# Patient Record
Sex: Male | Born: 1960
Health system: Southern US, Community
[De-identification: ages and names within clinical notes are randomized; demographics above are authoritative.]

## PROBLEM LIST (undated history)

## (undated) DIAGNOSIS — C801 Malignant (primary) neoplasm, unspecified: Secondary | ICD-10-CM

## (undated) DIAGNOSIS — R441 Visual hallucinations: Secondary | ICD-10-CM

## (undated) DIAGNOSIS — M5020 Other cervical disc displacement, unspecified cervical region: Secondary | ICD-10-CM

## (undated) DIAGNOSIS — E785 Hyperlipidemia, unspecified: Secondary | ICD-10-CM

## (undated) DIAGNOSIS — G4752 REM sleep behavior disorder: Secondary | ICD-10-CM

## (undated) DIAGNOSIS — I10 Essential (primary) hypertension: Secondary | ICD-10-CM

## (undated) HISTORY — DX: Hyperlipidemia, unspecified: E78.5

## (undated) HISTORY — DX: Malignant (primary) neoplasm, unspecified: C80.1

## (undated) HISTORY — PX: EYE SURGERY: SHX253

## (undated) HISTORY — PX: HERNIA REPAIR: SHX51

## (undated) HISTORY — DX: Other cervical disc displacement, unspecified cervical region: M50.20

## (undated) HISTORY — PX: FINGER FRACTURE SURGERY: SHX638

## (undated) HISTORY — DX: Essential (primary) hypertension: I10

## (undated) HISTORY — DX: REM sleep behavior disorder: G47.52

## (undated) HISTORY — DX: Visual hallucinations: R44.1

## (undated) HISTORY — PX: BASAL CELL CARCINOMA EXCISION: SHX1214

---

## 2005-01-02 ENCOUNTER — Ambulatory Visit: Payer: Self-pay | Admitting: Internal Medicine

## 2005-01-05 ENCOUNTER — Ambulatory Visit: Payer: Self-pay | Admitting: Internal Medicine

## 2006-06-07 ENCOUNTER — Ambulatory Visit: Payer: Self-pay | Admitting: Internal Medicine

## 2006-06-07 LAB — CONVERTED CEMR LAB
Chlamydia, DNA Probe: NEGATIVE
GC Probe Amp, Genital: NEGATIVE

## 2006-06-08 ENCOUNTER — Encounter: Payer: Self-pay | Admitting: Internal Medicine

## 2006-06-08 LAB — CONVERTED CEMR LAB
Alkaline Phosphatase: 26 units/L — ABNORMAL LOW (ref 39–117)
BUN: 8 mg/dL (ref 6–23)
Calcium: 9.3 mg/dL (ref 8.4–10.5)
Chol/HDL Ratio, serum: 5
Cholesterol: 228 mg/dL (ref 0–200)
Creatinine, Ser: 1 mg/dL (ref 0.4–1.5)
GFR calc non Af Amer: 85 mL/min
Sodium: 145 meq/L (ref 135–145)
Total Bilirubin: 1 mg/dL (ref 0.3–1.2)
Total Protein: 7.6 g/dL (ref 6.0–8.3)
Triglyceride fasting, serum: 232 mg/dL (ref 0–149)

## 2006-06-27 ENCOUNTER — Ambulatory Visit: Payer: Self-pay | Admitting: Internal Medicine

## 2006-07-24 ENCOUNTER — Ambulatory Visit: Payer: Self-pay | Admitting: Internal Medicine

## 2006-07-24 LAB — CONVERTED CEMR LAB
Albumin: 4.4 g/dL (ref 3.5–5.2)
Alkaline Phosphatase: 30 units/L — ABNORMAL LOW (ref 39–117)
Basophils Absolute: 0 10*3/uL (ref 0.0–0.1)
Basophils Relative: 0.9 % (ref 0.0–1.0)
Bilirubin, Direct: 0.2 mg/dL (ref 0.0–0.3)
CO2: 32 meq/L (ref 19–32)
Calcium: 9.9 mg/dL (ref 8.4–10.5)
Chloride: 101 meq/L (ref 96–112)
Cholesterol: 224 mg/dL (ref 0–200)
Eosinophils Relative: 2.7 % (ref 0.0–5.0)
GFR calc Af Amer: 116 mL/min
Glucose, Bld: 105 mg/dL — ABNORMAL HIGH (ref 70–99)
HDL: 51.2 mg/dL (ref >39.0)
Hemoglobin, Urine: NEGATIVE
MCHC: 34.8 g/dL (ref 30.0–36.0)
MCV: 94.4 fL (ref 78.0–100.0)
Monocytes Relative: 10.6 % (ref 3.0–11.0)
Neutrophils Relative %: 54.4 % (ref 43.0–77.0)
Nitrite: NEGATIVE
Platelets: 195 10*3/uL (ref 150–400)
Potassium: 3.6 meq/L (ref 3.5–5.1)
RDW: 11.5 % (ref 11.5–14.6)
Sodium: 140 meq/L (ref 135–145)
Specific Gravity, Urine: 1.01 (ref 1.000–1.03)
TSH: 2.11 microintl units/mL (ref 0.35–5.50)
Total Bilirubin: 1.2 mg/dL (ref 0.3–1.2)
Total Protein: 7.7 g/dL (ref 6.0–8.3)
VLDL: 36 mg/dL (ref 0–40)

## 2006-07-27 ENCOUNTER — Ambulatory Visit: Payer: Self-pay | Admitting: Internal Medicine

## 2006-07-30 ENCOUNTER — Ambulatory Visit: Payer: Self-pay

## 2006-08-13 ENCOUNTER — Ambulatory Visit: Payer: Self-pay | Admitting: Internal Medicine

## 2006-08-13 LAB — CONVERTED CEMR LAB
BUN: 9 mg/dL (ref 6–23)
CO2: 31 meq/L (ref 19–32)
Cholesterol: 161 mg/dL (ref 0–200)
GFR calc Af Amer: 116 mL/min
GFR calc non Af Amer: 96 mL/min
LDL Cholesterol: 80 mg/dL (ref 0–99)
Potassium: 3.8 meq/L (ref 3.5–5.1)
Triglycerides: 168 mg/dL — ABNORMAL HIGH (ref 0–149)

## 2006-08-16 ENCOUNTER — Ambulatory Visit: Payer: Self-pay | Admitting: Internal Medicine

## 2006-09-21 ENCOUNTER — Ambulatory Visit: Payer: Self-pay | Admitting: Internal Medicine

## 2006-09-25 ENCOUNTER — Ambulatory Visit: Payer: Self-pay | Admitting: Internal Medicine

## 2006-10-01 ENCOUNTER — Ambulatory Visit: Payer: Self-pay | Admitting: Internal Medicine

## 2006-10-15 ENCOUNTER — Ambulatory Visit: Payer: Self-pay | Admitting: Internal Medicine

## 2007-01-31 DIAGNOSIS — I10 Essential (primary) hypertension: Secondary | ICD-10-CM | POA: Insufficient documentation

## 2007-01-31 HISTORY — DX: Essential (primary) hypertension: I10

## 2007-09-13 ENCOUNTER — Telehealth: Payer: Self-pay | Admitting: Internal Medicine

## 2007-09-18 ENCOUNTER — Ambulatory Visit: Payer: Self-pay | Admitting: Internal Medicine

## 2007-09-18 DIAGNOSIS — E785 Hyperlipidemia, unspecified: Secondary | ICD-10-CM | POA: Insufficient documentation

## 2007-09-18 DIAGNOSIS — I2510 Atherosclerotic heart disease of native coronary artery without angina pectoris: Secondary | ICD-10-CM | POA: Insufficient documentation

## 2007-09-19 ENCOUNTER — Ambulatory Visit: Payer: Self-pay | Admitting: Cardiology

## 2007-12-02 ENCOUNTER — Telehealth: Payer: Self-pay | Admitting: Internal Medicine

## 2007-12-02 ENCOUNTER — Ambulatory Visit: Payer: Self-pay | Admitting: Internal Medicine

## 2007-12-02 DIAGNOSIS — R519 Headache, unspecified: Secondary | ICD-10-CM | POA: Insufficient documentation

## 2007-12-02 DIAGNOSIS — R51 Headache: Secondary | ICD-10-CM | POA: Insufficient documentation

## 2007-12-02 LAB — CONVERTED CEMR LAB
Creatinine, Ser: 1 mg/dL (ref 0.4–1.5)
Potassium: 3.9 meq/L (ref 3.5–5.1)

## 2007-12-03 ENCOUNTER — Telehealth: Payer: Self-pay | Admitting: Internal Medicine

## 2008-03-06 ENCOUNTER — Ambulatory Visit: Payer: Self-pay | Admitting: Internal Medicine

## 2008-03-06 LAB — CONVERTED CEMR LAB
AST: 60 units/L — ABNORMAL HIGH (ref 0–37)
Alkaline Phosphatase: 25 units/L — ABNORMAL LOW (ref 39–117)
Basophils Absolute: 0 10*3/uL (ref 0.0–0.1)
Basophils Relative: 0.7 % (ref 0.0–3.0)
Bilirubin Urine: NEGATIVE
Bilirubin, Direct: 0.1 mg/dL (ref 0.0–0.3)
Calcium: 9.5 mg/dL (ref 8.4–10.5)
Eosinophils Absolute: 0.1 10*3/uL (ref 0.0–0.7)
Eosinophils Relative: 2.7 % (ref 0.0–5.0)
HDL: 47.2 mg/dL (ref >39.0)
Hemoglobin, Urine: NEGATIVE
Hemoglobin: 15.8 g/dL (ref 13.0–17.0)
Ketones, ur: NEGATIVE mg/dL
LDL Cholesterol: 91 mg/dL (ref 0–99)
Lymphocytes Relative: 34.9 % (ref 12.0–46.0)
Monocytes Absolute: 0.5 10*3/uL (ref 0.1–1.0)
Monocytes Relative: 10.3 % (ref 3.0–12.0)
Neutrophils Relative %: 51.4 % (ref 43.0–77.0)
PSA: 0.82 ng/mL (ref 0.10–4.00)
Platelets: 182 10*3/uL (ref 150–400)
RBC: 4.6 M/uL (ref 4.22–5.81)
Sodium: 140 meq/L (ref 135–145)
Specific Gravity, Urine: <=1.005 (ref 1.000–1.03)
TSH: 1.58 microintl units/mL (ref 0.35–5.50)
Total Protein, Urine: NEGATIVE mg/dL
Triglycerides: 119 mg/dL (ref 0–149)
VLDL: 24 mg/dL (ref 0–40)
WBC: 4.6 10*3/uL (ref 4.5–10.5)
pH: 6.5 (ref 5.0–8.0)

## 2008-03-13 ENCOUNTER — Ambulatory Visit: Payer: Self-pay | Admitting: Internal Medicine

## 2009-05-10 ENCOUNTER — Telehealth: Payer: Self-pay | Admitting: Internal Medicine

## 2009-05-27 ENCOUNTER — Ambulatory Visit: Payer: Self-pay | Admitting: Internal Medicine

## 2009-05-27 LAB — CONVERTED CEMR LAB
ALT: 117 units/L — ABNORMAL HIGH (ref 0–53)
AST: 69 units/L — ABNORMAL HIGH (ref 0–37)
Bilirubin Urine: NEGATIVE
Bilirubin, Direct: 0.2 mg/dL (ref 0.0–0.3)
CO2: 29 meq/L (ref 19–32)
Calcium: 9.3 mg/dL (ref 8.4–10.5)
Chloride: 104 meq/L (ref 96–112)
Cholesterol: 163 mg/dL (ref 0–200)
Creatinine, Ser: 1 mg/dL (ref 0.4–1.5)
Glucose, Bld: 96 mg/dL (ref 70–99)
HDL: 46.1 mg/dL (ref >39.00)
Hemoglobin, Urine: NEGATIVE
Hemoglobin: 15.7 g/dL (ref 13.0–17.0)
Ketones, ur: NEGATIVE mg/dL
Lymphocytes Relative: 39.4 % (ref 12.0–46.0)
Lymphs Abs: 1.5 10*3/uL (ref 0.7–4.0)
MCHC: 33.5 g/dL (ref 30.0–36.0)
Neutrophils Relative %: 46.4 % (ref 43.0–77.0)
PSA: 0.81 ng/mL (ref 0.10–4.00)
Platelets: 156 10*3/uL (ref 150.0–400.0)
Potassium: 4.1 meq/L (ref 3.5–5.1)
Specific Gravity, Urine: 1.015 (ref 1.000–1.030)
TSH: 2.12 microintl units/mL (ref 0.35–5.50)
Total Bilirubin: 1 mg/dL (ref 0.3–1.2)
Urine Glucose: NEGATIVE mg/dL

## 2009-06-01 ENCOUNTER — Ambulatory Visit: Payer: Self-pay | Admitting: Internal Medicine

## 2009-06-01 DIAGNOSIS — M25519 Pain in unspecified shoulder: Secondary | ICD-10-CM | POA: Insufficient documentation

## 2009-07-21 ENCOUNTER — Ambulatory Visit: Payer: Self-pay | Admitting: Internal Medicine

## 2009-07-21 LAB — CONVERTED CEMR LAB
Albumin: 4.6 g/dL (ref 3.5–5.2)
Alkaline Phosphatase: 27 units/L — ABNORMAL LOW (ref 39–117)
Total Bilirubin: 0.5 mg/dL (ref 0.3–1.2)

## 2009-07-27 ENCOUNTER — Telehealth: Payer: Self-pay | Admitting: Internal Medicine

## 2009-07-28 ENCOUNTER — Ambulatory Visit: Payer: Self-pay | Admitting: Internal Medicine

## 2009-07-28 LAB — CONVERTED CEMR LAB
HCV Ab: NEGATIVE
Hep A IgM: NEGATIVE

## 2009-08-10 ENCOUNTER — Encounter: Payer: Self-pay | Admitting: Internal Medicine

## 2009-12-29 ENCOUNTER — Ambulatory Visit: Payer: Self-pay | Admitting: Internal Medicine

## 2009-12-29 ENCOUNTER — Telehealth: Payer: Self-pay | Admitting: Internal Medicine

## 2009-12-31 ENCOUNTER — Telehealth: Payer: Self-pay | Admitting: Internal Medicine

## 2009-12-31 ENCOUNTER — Ambulatory Visit: Payer: Self-pay | Admitting: Internal Medicine

## 2010-01-03 ENCOUNTER — Ambulatory Visit: Payer: Self-pay | Admitting: Internal Medicine

## 2010-01-07 ENCOUNTER — Telehealth: Payer: Self-pay | Admitting: Internal Medicine

## 2010-01-07 ENCOUNTER — Ambulatory Visit: Payer: Self-pay | Admitting: Internal Medicine

## 2010-01-10 ENCOUNTER — Ambulatory Visit: Payer: Self-pay | Admitting: Cardiovascular Disease

## 2010-01-10 ENCOUNTER — Ambulatory Visit: Payer: Self-pay | Admitting: Internal Medicine

## 2010-01-10 ENCOUNTER — Encounter: Payer: Self-pay | Admitting: Internal Medicine

## 2010-01-10 ENCOUNTER — Observation Stay (HOSPITAL_COMMUNITY): Admission: AD | Admit: 2010-01-10 | Discharge: 2010-01-11 | Payer: Self-pay | Admitting: Internal Medicine

## 2010-06-30 NOTE — Assessment & Plan Note (Signed)
Summary: bp check/cd--attention:Sarah  Medications Added DILTIAZEM HCL ER BEADS 360 MG XR24H-CAP (DILTIAZEM HCL ER BEADS) 1 by mouth once daily for blood pressure       Nurse Visit   Vital Signs:  Patient profile:   50 year old male Pulse rate:   67 / minute BP sitting:   150 / 100  (left arm)  Vitals Entered By: Lamar Sprinkles, CMA (January 07, 2010 11:52 AM) CC: Elevated BP per pharmacy BP machine Comments MD spoke w/pt and changed bp med.    Allergies: No Known Drug Allergies  Orders Added: 1)  Est. Patient Level I [55732] Prescriptions: DILTIAZEM HCL ER BEADS 360 MG XR24H-CAP (DILTIAZEM HCL ER BEADS) 1 by mouth once daily for blood pressure  #90 x 3   Entered by:   Lamar Sprinkles, CMA   Authorized by:   Jacques Navy MD   Signed by:   Lamar Sprinkles, CMA on 01/07/2010   Method used:   Electronically to        [facility] [phone]* (retail)       [address]       [place], [place]  [address]       Ph: [phone] or [phone]       Fax: [phone]   RxID:   [id number]

## 2010-06-30 NOTE — Initial Assessments (Signed)
Summary: CHEST PAIN--TIGHTNESS  SOB--STC   Vital Signs:  Patient profile:   50 year old male Height:      71 inches Weight:      218 pounds BMI:     30.51 O2 Sat:      97 % on Room air Temp:     98.0 degrees F oral Pulse rate:   68 / minute BP sitting:   162 / 102  (left arm) Cuff size:   regular  Vitals Entered By: Bill Salinas CMA (January 10, 2010 9:04 AM)  O2 Flow:  Room air  Primary Care Shaneequa Bahner:  [facility]   History of Present Illness: [name] [name] presents acutely c/o chest pain for the past two days: he describes his discomfort and difficulty and pain with deep inspiration and a heavy pressure in his chest. He does admit to mild discomfort at rest. He also reports that this weekend while playing golf he had marked decreased exercise tolerance and that walking up a hill, which he usually does without any trouble, was very difficult due to weakness but not acute chest pain. He does report that about a month ago while riding his bike he had left sided chest pain that radiated to his back that lasted 10-215 minutes and subsided. EKG in the office reveals loss of r-wave in V1 and V2 suggestive of old anterior infarct.l  Cardiac Risk: male, hypertensive, hyperlipidemic on medication, positive for family history with his father have had multiple MIs and dying at age 50 of MI.  He is now admitted to r/o MI.   Current Medications (verified): 1)  Hyzaar 100-25 Mg  Tabs (Losartan Potassium-Hctz) .... Take 1 Tablet By Mouth Once A Day 2)  Adult Aspirin Low Strength 81 Mg  Tbdp (Aspirin) .... Take 1 Tablet By Mouth Once A Day 3)  Simvastatin 40 Mg  Tabs (Simvastatin) .Marland Kitchen.. 1 By Mouth Qpm 4)  Goodys Extra Strength [id number] Mg  Pack (Aspirin-Acetaminophen-Caffeine) .... As Directed As Needed 5)  Diltiazem Hcl Er Beads 360 Mg Xr24h-Cap (Diltiazem Hcl Er Beads) .Marland Kitchen.. 1 By Mouth Once Daily For Blood Pressure  Allergies (verified): No Known Drug Allergies  Past History:  Past Medical  History: Last updated: 04-Apr-2008 Hypertension Hyperlipidemia skin cancer-basal cell face and abdomen  Past Surgical History: Last updated: 04-Apr-2008 Hernia sx-age 46 Eye sx-child Excision of basal cell carcinomas  Fx 5th digit left hand x 2  Family History: Last updated: 04/04/08 Father- deceased @51 :: DM,HTN,MI x 7 Mother - 1940: HTN, lipid Family History Hypertension Family History MI Family History CVA Neg - prostate or colon cancer  Social History: Last updated: April 04, 2008 HSG married '83- divorced 13 yrs; married '97- 2 years divorced; married '03- 3 years divorced. 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  Risk Factors: Alcohol Use: 2 (04/04/08) Caffeine Use: 0 (Apr 04, 2008) Exercise: no (04-Apr-2008)  Risk Factors: Smoking Status: never (09/18/2007) Passive Smoke Exposure: no (2008-04-04)  Review of Systems       The patient complains of chest pain.  The patient denies anorexia, fever, weight loss, weight gain, decreased hearing, hoarseness, syncope, dyspnea on exertion, peripheral edema, prolonged cough, abdominal pain, severe indigestion/heartburn, incontinence, muscle weakness, difficulty walking, depression, and abnormal bleeding.    Physical Exam  General:  WNWD atheletic appearing white male in no acute distress Head:  Normocephalic and atraumatic without obvious abnormalities. No apparent alopecia or balding. Eyes:  No corneal or conjunctival inflammation noted. EOMI. Perrla. Funduscopic exam benign, without  hemorrhages, exudates or papilledema. Vision grossly normal. Ears:  External ear exam shows no significant lesions or deformities.  Otoscopic examination reveals clear canals, tympanic membranes are intact bilaterally without bulging, retraction, inflammation or discharge. Hearing is grossly normal bilaterally. Mouth:  missing several teeth. No oral lesions Neck:  supple, full ROM, no thyromegaly,  and no carotid bruits.   Chest Wall:  No deformities, masses, tenderness or gynecomastia noted. Lungs:  Normal respiratory effort, chest expands symmetrically. Lungs are clear to auscultation, no crackles or wheezes. Heart:  Normal rate and regular rhythm. S1 and S2 normal without gallop, murmur, click, rub or other extra sounds. Abdomen:  soft, non-tender, normal bowel sounds, no distention, no masses, no guarding, and no hepatomegaly.   Prostate:  deferred Msk:  normal ROM, no joint tenderness, no joint swelling, no joint warmth, and no joint deformities.   Pulses:  2+RADIAL PULSE Extremities:  No clubbing, cyanosis, edema, or deformity noted with normal full range of motion of all joints.   Neurologic:  alert & oriented X3, cranial nerves II-XII intact, strength normal in all extremities, sensation intact to light touch, gait normal, and DTRs symmetrical and normal.   Skin:  turgor normal, color normal, no rashes, no suspicious lesions, and no ulcerations.   Cervical Nodes:  No lymphadenopathy noted Axillary Nodes:  No palpable lymphadenopathy Psych:  Oriented X3, memory intact for recent and remote, normally interactive, and good eye contact.     Impression & Recommendations:  Problem # 1:  CHEST PAIN (ICD-786.50)  Patient with typical chest pain, decreased exercise tolerance and mutliple risk factors including very strong family history. His EKG does suggest old injury. He is now for admission  Plan - telemetry admit           cardiology consult           IV heparin-pharmacy to dose           Lab - cardiac panel q 8 x 3, CBC, metabolic, D-dimer           Portable erect chest x-ray           Meds - lopressor 25mg  two times a day, continue home meds  Orders: No Charge Patient Arrived (NCPA0) (NCPA0)  Problem # 2:  HYPERLIPIDEMIA (ICD-272.4)  His updated medication list for this problem includes:    Simvastatin 40 Mg Tabs (Simvastatin) .Marland Kitchen... 1 by mouth qpm  Labs  Reviewed: SGOT: 49 (07/21/2009)   SGPT: 64 (07/21/2009)   HDL:46.10 (05/27/2009), 47.2 (03/06/2008)  LDL:87 (05/27/2009), 91 (03/06/2008)  Chol:163 (05/27/2009), 162 (03/06/2008)  Trig:149.0 (05/27/2009), 119 (03/06/2008)  Will continue home meds  Problem # 3:  HYPERTENSION (ICD-401.9)  His updated medication list for this problem includes:    Hyzaar 100-25 Mg Tabs (Losartan potassium-hctz) .Marland Kitchen... Take 1 tablet by mouth once a day    Diltiazem Hcl Er Beads 360 Mg Xr24h-cap (Diltiazem hcl er beads) .Marland Kitchen... 1 by mouth once daily for blood pressure  BP today: 162/102 Prior BP: 138/92 (01/03/2010)  Poor control - he had done better on increased dose of diltiazem.  Will continue but add beta-blocker.   Complete Medication List: 1)  Hyzaar 100-25 Mg Tabs (Losartan potassium-hctz) .... Take 1 tablet by mouth once a day 2)  Adult Aspirin Low Strength 81 Mg Tbdp (Aspirin) .... Take 1 tablet by mouth once a day 3)  Simvastatin 40 Mg Tabs (Simvastatin) .Marland Kitchen.. 1 by mouth qpm 4)  Goodys Extra Strength [id number] Mg Pack (Aspirin-acetaminophen-caffeine) .... As  directed as needed 5)  Diltiazem Hcl Er Beads 360 Mg Xr24h-cap (Diltiazem hcl er beads) .Marland Kitchen.. 1 by mouth once daily for blood pressure  Appended Document: CHEST PAIN--TIGHTNESS  SOB--STC Patient: [name] [name] Note: All result statuses are Final unless otherwise noted.  Tests: (1) BMP (METABOL)   Sodium                    141 mEq/L                   135-145   Potassium                 4.1 mEq/L                   3.5-5.1   Chloride                  104 mEq/L                   96-112   Carbon Dioxide            29 mEq/L                    19-32   Glucose                   96 mg/dL                    16-10   BUN                       10 mg/dL                    9-60   Creatinine                1.0 mg/dL                   4.5-4.0   Calcium                   9.3 mg/dL                   9.8-11.9   GFR                       83.97 mL/min                 >60  Tests: (2) Lipid Panel (LIPID)   Cholesterol               163 mg/dL                   1-478     ATP III Classification            Desirable:  < 200 mg/dL                    Borderline High:  200 - 239 mg/dL               High:  > = 240 mg/dL   Triglycerides             149.0 mg/dL                 2.9-562.1     Normal:  <150 mg/dL     Borderline High:  308 - 199 mg/dL   HDL  46.10 mg/dL                 >91.47   VLDL Cholesterol          29.8 mg/dL                  8.2-95.6   LDL Cholesterol           87 mg/dL                    2-13  CHO/HDL Ratio:  CHD Risk                             4                    Men          Women     1/2 Average Risk     3.4          3.3     Average Risk          5.0          4.4     2X Average Risk          9.6          7.1     3X Average Risk          15.0          11.0                           Tests: (3) CBC Platelet w/Diff (CBCD)   White Cell Count     [L]  3.9 K/uL                    4.5-10.5   Red Cell Count            4.66 Mil/uL                 4.22-5.81   Hemoglobin                15.7 g/dL                   08.6-57.8   Hematocrit                46.7 %                      39.0-52.0   MCV                  [H]  100.3 fl                    78.0-100.0   MCHC                      33.5 g/dL                   46.9-62.9   RDW                       11.6 %                      11.5-14.6   Platelet Count            156.0 K/uL  150.0-400.0   Neutrophil %              46.4 %                      43.0-77.0   Lymphocyte %              39.4 %                      12.0-46.0   Monocyte %                10.3 %                      3.0-12.0   Eosinophils%              3.2 %                       0.0-5.0   Basophils %               0.7 %                       0.0-3.0   Neutrophill Absolute      1.9 K/uL                    1.4-7.7   Lymphocyte Absolute       1.5 K/uL                    0.7-4.0   Monocyte Absolute          0.4 K/uL                    0.1-1.0  Eosinophils, Absolute                             0.1 K/uL                    0.0-0.7   Basophils Absolute        0.0 K/uL                    0.0-0.1  Tests: (4) Hepatic/Liver Function Panel (HEPATIC)   Total Bilirubin           1.0 mg/dL                   4.5-4.0   Direct Bilirubin          0.2 mg/dL                   9.8-1.1   Alkaline Phosphatase [L]  28 U/L                      39-117   AST                  [H]  69 U/L                      0-37   ALT                  [H]  117 U/L                     0-53   Total Protein  7.8 g/dL                    0.8-6.5   Albumin                   4.6 g/dL                    7.8-4.6  Tests: (5) TSH (TSH)   FastTSH                   2.12 uIU/mL                 0.35-5.50  Tests: (6) Prostate Specific Antigen (PSA)   PSA-Hyb                   0.81 ng/mL                  0.10-4.00  Tests: (7) UDip Only (UDIP)   Color                     YELLOW       RANGE:  Yellow;Lt. Yellow   Clarity                   CLEAR                       Clear   Specific Gravity          1.015                       1.000 - 1.030   Urine Ph                  6.0                         5.0-8.0   Protein                   NEGATIVE                    Negative   Urine Glucose             NEGATIVE                    Negative   Ketones                   NEGATIVE                    Negative   Urine Bilirubin           NEGATIVE                    Negative   Blood                     NEGATIVE                    Negative   Urobilinogen              0.2                         0.0 - 1.0   Leukocyte Esterace        NEGATIVE  Negative   Nitrite                   NEGATIVE                    Negative

## 2010-06-30 NOTE — Progress Notes (Signed)
  Phone Note Outgoing Call   Reason for Call: Discuss lab or test results Summary of Call: Please call patinet : liver functions remain elevated off simvastatin. He will need to come in for additional Lab: 790.4 chronic Hep B, chronic Hep C panels. You can check the spectrum catalogue for test number.  Thanks Initial call taken by: Jacques Navy MD,  July 27, 2009 12:04 PM  Follow-up for Phone Call        informed pt, labs put in IDX Follow-up by: Ami Bullins CMA,  July 27, 2009 4:13 PM

## 2010-06-30 NOTE — Assessment & Plan Note (Signed)
Summary: CPX/#/LB   Vital Signs:  Patient profile:   50 year old male Height:      71 inches Weight:      233 pounds BMI:     32.61 O2 Sat:      95 % on Room air Temp:     98.5 degrees F oral Pulse rate:   69 / minute BP sitting:   130 / 88  (left arm) Cuff size:   large  Vitals Entered By: Ami Bullins CMA (June 01, 2009 1:27 PM)  O2 Flow:  Room air CC: pt here for cpx/ ab   Primary Care Provider:  Norins  CC:  pt here for cpx/ ab.  History of Present Illness: for routine exam. Has left shoulder - cannot fully adduct. Had remote injury - 5 years ago.  He is otherwise well.  Current Medications (verified): 1)  Hyzaar 100-25 Mg  Tabs (Losartan Potassium-Hctz) .... Take 1 Tablet By Mouth Once A Day 2)  Diltiazem Hcl Coated Beads 180 Mg  Cp24 (Diltiazem Hcl Coated Beads) .... Take 1 Tablet By Mouth Once A Day 3)  Adult Aspirin Low Strength 81 Mg  Tbdp (Aspirin) .... Take 1 Tablet By Mouth Once A Day 4)  Simvastatin 40 Mg  Tabs (Simvastatin) .Marland Kitchen.. 1 By Mouth Qpm 5)  Goodys Extra Strength [id number] Mg  Pack (Aspirin-Acetaminophen-Caffeine) .... As Directed As Needed  Allergies (verified): No Known Drug Allergies  Past History:  Past Medical History: Last updated: 03-28-2008 Hypertension Hyperlipidemia skin cancer-basal cell face and abdomen  Past Surgical History: Last updated: 2008-03-28 Hernia sx-age 10 Eye sx-child Excision of basal cell carcinomas  Fx 5th digit left hand x 2  Family History: Last updated: 03-28-2008 Father- deceased @51 :: DM,HTN,MI x 7 Mother - 1940: HTN, lipid Family History Hypertension Family History MI Family History CVA Neg - prostate or colon cancer  Social History: Last updated: 03-28-08 HSG married '83- divorced 13 yrs; married '97- 2 years divorced; married '03- 3 years divorced. 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  Risk Factors: Alcohol Use: 2  (2008/03/28) Caffeine Use: 0 (28-Mar-2008) Exercise: no (03-28-08)  Risk Factors: Smoking Status: never (09/18/2007) Passive Smoke Exposure: no (03/28/2008)  Review of Systems  The patient denies anorexia, fever, weight loss, weight gain, vision loss, decreased hearing, hoarseness, chest pain, syncope, dyspnea on exertion, peripheral edema, prolonged cough, headaches, hemoptysis, abdominal pain, melena, hematochezia, severe indigestion/heartburn, hematuria, incontinence, genital sores, muscle weakness, suspicious skin lesions, transient blindness, difficulty walking, depression, unusual weight change, abnormal bleeding, enlarged lymph nodes, angioedema, and testicular masses.    Physical Exam  General:  Well-developed,well-nourished,in no acute distress; alert,appropriate and cooperative throughout examination Head:  Normocephalic and atraumatic without obvious abnormalities. No apparent alopecia or balding. Eyes:  No corneal or conjunctival inflammation noted. EOMI. Perrla. Funduscopic exam benign, without hemorrhages, exudates or papilledema. Vision grossly normal. Ears:  External ear exam shows no significant lesions or deformities.  Otoscopic examination reveals clear canals, tympanic membranes are intact bilaterally without bulging, retraction, inflammation or discharge. Hearing is grossly normal bilaterally. Nose:  External nasal examination shows no deformity or inflammation. Nasal mucosa are pink and moist without lesions or exudates. Mouth:  Oral mucosa and oropharynx without lesions or exudates.  Teeth in good repair. Neck:  supple, full ROM, no thyromegaly, and no carotid bruits.   Chest Wall:  No deformities, masses, tenderness or gynecomastia noted. Lungs:  Normal respiratory effort, chest expands symmetrically. Lungs are clear to  auscultation, no crackles or wheezes. Heart:  Normal rate and regular rhythm. S1 and S2 normal without gallop, murmur, click, rub or other extra  sounds. Abdomen:  Bowel sounds positive,abdomen soft and non-tender without masses, organomegaly or hernias noted. Rectal:  No external abnormalities noted. Normal sphincter tone. No rectal masses or tenderness. Prostate:  Prostate gland firm and smooth, no enlargement, nodularity, tenderness, mass, asymmetry or induration. Msk:  no joint tenderness, no joint swelling, no joint warmth, and no redness over joints.  Decreased ROM left shoulder. Pulses:  R and L carotid,radial,femoral,dorsalis pedis and posterior tibial pulses are full and equal bilaterally Extremities:  No clubbing, cyanosis, edema, or deformity noted with normal full range of motion of all joints.   Neurologic:  No cranial nerve deficits noted. Station and gait are normal. Plantar reflexes are down-going bilaterally. DTRs are symmetrical throughout. Sensory, motor and coordinative functions appear intact. Skin:  turgor normal, color normal, no suspicious lesions, no petechiae, and no ulcerations.   Cervical Nodes:  no anterior cervical adenopathy and no posterior cervical adenopathy.   Inguinal Nodes:  no R inguinal adenopathy and no L inguinal adenopathy.   Psych:  Cognition and judgment appear intact. Alert and cooperative with normal attention span and concentration. No apparent delusions, illusions, hallucinations   Impression & Recommendations:  Problem # 1:  HYPERTENSION (ICD-401.9)  His updated medication list for this problem includes:    Hyzaar 100-25 Mg Tabs (Losartan potassium-hctz) .Marland Kitchen... Take 1 tablet by mouth once a day    Diltiazem Hcl Coated Beads 180 Mg Cp24 (Diltiazem hcl coated beads) .Marland Kitchen... Take 1 tablet by mouth once a day  BP today: 130/88 Prior BP: 134/88 (03/13/2008)  Good control. Continue present medications  Problem # 2:  HYPERLIPIDEMIA (ICD-272.4) Good control with LDL at goal. HOwever, mild elevation in transaminases, less than 3 x normal. Reviewed labs to '08 and thee has been a consistent  elevation since starting medication. He is asymptomatic and exam is normal.  Plan - drug holiday followed by repeat hepatic panel: if LFTs normal than it is the simvastatin and we can try an alternative medication  His updated medication list for this problem includes:    Simvastatin 40 Mg Tabs (Simvastatin) .Marland Kitchen... 1 by mouth qpm  Problem # 3:  SHOULDER PAIN, CHRONIC (ICD-719.41) Left shoulder pain with decreased ROM that has been present for some time.  Plan - will refer to orthopedics at patient's descretion.  His updated medication list for this problem includes:    Adult Aspirin Low Strength 81 Mg Tbdp (Aspirin) .Marland Kitchen... Take 1 tablet by mouth once a day    Goodys Extra Strength [phone] Mg Pack (Aspirin-acetaminophen-caffeine) .Marland Kitchen... As directed as needed  Problem # 4:  Preventive Health Care (ICD-V70.0) Normal history and normal exam. Lab results are fine except for mild elevation in liver functions. He is a candidate for colonoscopy for colorectal cancer screening - can schedule when he is ready and has checked his insurance coverage.  In summary - a very nice man with mild elevation in liver functions most likely from simvastatin.  Plan as above.   Complete Medication List: 1)  Hyzaar 100-25 Mg Tabs (Losartan potassium-hctz) .... Take 1 tablet by mouth once a day 2)  Diltiazem Hcl Coated Beads 180 Mg Cp24 (Diltiazem hcl coated beads) .... Take 1 tablet by mouth once a day 3)  Adult Aspirin Low Strength 81 Mg Tbdp (Aspirin) .... Take 1 tablet by mouth once a day 4)  Simvastatin 40  Mg Tabs (Simvastatin) .Marland Kitchen.. 1 by mouth qpm 5)  Goodys Extra Strength [phone] Mg Pack (Aspirin-acetaminophen-caffeine) .... As directed as needed   Patient: [name] [name] Note: All result statuses are Final unless otherwise noted.  Tests: (1) BMP (METABOL)   Sodium                    141 mEq/L                   135-145   Potassium                 4.1 mEq/L                   3.5-5.1   Chloride                   104 mEq/L                   96-112   Carbon Dioxide            29 mEq/L                    19-32   Glucose                   96 mg/dL                    54-09   BUN                       10 mg/dL                    8-11   Creatinine                1.0 mg/dL                   9.1-4.7   Calcium                   9.3 mg/dL                   8.2-95.6   GFR                       83.97 mL/min                >60  Tests: (2) Lipid Panel (LIPID)   Cholesterol               163 mg/dL                   2-130     ATP III Classification            Desirable:  < 200 mg/dL                    Borderline High:  200 - 239 mg/dL               High:  > = 240 mg/dL   Triglycerides             149.0 mg/dL                 8.6-578.4     Normal:  <150 mg/dL     Borderline High:  696 - 199 mg/dL   HDL  46.10 mg/dL                 >16.10   VLDL Cholesterol          29.8 mg/dL                  9.6-04.5   LDL Cholesterol           87 mg/dL                    4-09  CHO/HDL Ratio:  CHD Risk                             4                    Men          Women     1/2 Average Risk     3.4          3.3     Average Risk          5.0          4.4     2X Average Risk          9.6          7.1     3X Average Risk          15.0          11.0                           Tests: (3) CBC Platelet w/Diff (CBCD)   White Cell Count     [L]  3.9 K/uL                    4.5-10.5   Red Cell Count            4.66 Mil/uL                 4.22-5.81   Hemoglobin                15.7 g/dL                   81.1-91.4   Hematocrit                46.7 %                      39.0-52.0   MCV                  [H]  100.3 fl                    78.0-100.0   MCHC                      33.5 g/dL                   78.2-95.6   RDW                       11.6 %                      11.5-14.6   Platelet Count            156.0 K/uL  150.0-400.0   Neutrophil %              46.4 %                       43.0-77.0   Lymphocyte %              39.4 %                      12.0-46.0   Monocyte %                10.3 %                      3.0-12.0   Eosinophils%              3.2 %                       0.0-5.0   Basophils %               0.7 %                       0.0-3.0   Neutrophill Absolute      1.9 K/uL                    1.4-7.7   Lymphocyte Absolute       1.5 K/uL                    0.7-4.0   Monocyte Absolute         0.4 K/uL                    0.1-1.0  Eosinophils, Absolute                             0.1 K/uL                    0.0-0.7   Basophils Absolute        0.0 K/uL                    0.0-0.1  Tests: (4) Hepatic/Liver Function Panel (HEPATIC)   Total Bilirubin           1.0 mg/dL                   1.4-7.8   Direct Bilirubin          0.2 mg/dL                   2.9-5.6   Alkaline Phosphatase [L]  28 U/L                      39-117   AST                  [H]  69 U/L                      0-37   ALT                  [H]  117 U/L                     0-53   Total Protein  7.8 g/dL                    1.6-1.0   Albumin                   4.6 g/dL                    9.6-0.4  Tests: (5) TSH (TSH)   FastTSH                   2.12 uIU/mL                 0.35-5.50  Tests: (6) Prostate Specific Antigen (PSA)   PSA-Hyb                   0.81 ng/mL                  0.10-4.00  Tests: (7) UDip Only (UDIP)   Color                     YELLOW       RANGE:  Yellow;Lt. Yellow   Clarity                   CLEAR                       Clear   Specific Gravity          1.015                       1.000 - 1.030   Urine Ph                  6.0                         5.0-8.0   Protein                   NEGATIVE                    Negative   Urine Glucose             NEGATIVE                    Negative   Ketones                   NEGATIVE                    Negative   Urine Bilirubin           NEGATIVE                    Negative   Blood                     NEGATIVE                     Negative   Urobilinogen              0.2                         0.0 - 1.0   Leukocyte Esterace        NEGATIVE  Negative   Nitrite                   NEGATIVE                    NegativePrescriptions: SIMVASTATIN 40 MG  TABS (SIMVASTATIN) 1 by mouth QPM  #30.0 Each x 5   Entered and Authorized by:   Jacques Navy MD   Signed by:   Jacques Navy MD on 06/01/2009   Method used:   Electronically to        [id number]       [address]       [place], [place]  [address]       Ph: [phone]       Fax: [phone]   RxID:   [id number] DILTIAZEM HCL COATED BEADS 180 MG  CP24 (DILTIAZEM HCL COATED BEADS) Take 1 tablet by mouth once a day  #30 x 12   Entered and Authorized by:   Jacques Navy MD   Signed by:   Jacques Navy MD on 06/01/2009   Method used:   Electronically to        [id number]       [address]       [place], [place]  [address]       Ph: [phone]       Fax: [phone]   RxID:   [id number] HYZAAR 100-25 MG  TABS (LOSARTAN POTASSIUM-HCTZ) Take 1 tablet by mouth once a day  #30 x 12   Entered and Authorized by:   Jacques Navy MD   Signed by:   Jacques Navy MD on 06/01/2009   Method used:   Electronically to        [id number]       [address]       [place], [place]  [address]       Ph: [phone]       Fax: [phone]   RxID:   [id number]    Not Administered:    Influenza Vaccine not given due to: declined

## 2010-06-30 NOTE — Progress Notes (Signed)
Summary: Diltiazem  Phone Note From Pharmacy   [id number] Summary of Call: Pharmacy called. They only have Diltiazem tablets, please advise as Diltiazem cap was sent in this morning.  Initial call taken by: Robin Ewing CMA Duncan Dull),  January 07, 2010 1:07 PM  Follow-up for Phone Call        tablets are just fine. Follow-up by: Jacques Navy MD,  January 07, 2010 2:36 PM  Additional Follow-up for Phone Call Additional follow up Details #1::        Pharmacy notified. Lucious Groves CMA  January 07, 2010 5:05 PM

## 2010-06-30 NOTE — Progress Notes (Signed)
Summary: ELEVATED BP   Phone Note Call from Patient Call back at Home Phone [phone]   Summary of Call: Pt c/o elevated bp. Readings are around 150/100 x last 3 days. Initial call taken by: Lamar Sprinkles, CMA,  December 29, 2009 10:45 AM  Follow-up for Phone Call        Spoke w/pt. He has had dental work recently and is on an antibiotic. Pt is already scheduled for nurse visit BP check. Advised patient that his PCP is not in the office this afternoon and if bp is elevated he may need to see another MD in the office for eval. He agreed.  Follow-up by: Lamar Sprinkles, CMA,  December 29, 2009 11:26 AM  Additional Follow-up for Phone Call Additional follow up Details #1::        Spoke w/MD, we are to contact him w/readings when patient comes in to the office. ................Marland KitchenLamar Sprinkles, CMA  December 29, 2009 1:36 PM   Patient was seen today and MD informed. See nurse visit. Additional Follow-up by: Lamar Sprinkles, CMA,  December 29, 2009 3:52 PM

## 2010-06-30 NOTE — Progress Notes (Signed)
Summary: Cholesterol  Phone Note Call from Patient   Summary of Call: Pt had elevated liver enzymes earlier this year. He stopped cholesterol medication and wanted to know what to do next. I see that he needed repeat labs 6 mths after labs in March. If still elevated will need GI referral. What about the cholesterol medication?  Initial call taken by: Lamar Sprinkles, CMA,  December 31, 2009 3:06 PM  Follow-up for Phone Call        Lab: hepatic 995.20. If enzymes are normal will consider rechallenge with different statin or non-statin drug.  Additional Follow-up for Phone Call Additional follow up Details #1::        Pt informed, he has concerns about bp medication and scheduled office visit for today. Additional Follow-up by: Lamar Sprinkles, CMA,  January 03, 2010 12:23 PM

## 2010-06-30 NOTE — Letter (Signed)
     August 13, 2009   [name] [address] [place], [place] [address]  RE:  LAB RESULTS  Dear  [name],  The following is an interpretation of your most recent lab tests.  Please take note of any instructions provided or changes to medications that have resulted from your lab work.    Hepatatis screens are all negative.    I recommend rechecking liver functions in 6 months. If there is still an elevation I will refer you to a gastroenterologist for the sake of completeness of the evaluation.  Call or e-mail me if you have questions ([email or web address]).   Sincerely Yours,    Jacques Navy MD  Patient: [name] [name] Note: All result statuses are Final unless otherwise noted.  Tests: (1) Hepatitis Acute Panel (47829)  Hepatitis B Surface Antigen                             NEG                         NEGATIVE  Hepatitis B Core Ab, IgM                             NEG                         NEGATIVE     High levels of Hepatitis B Core IgM antibody are detectable     during the acute stage of Hepatitis B. This antibody is used     to differentiate current from past HBV infection.        Hepatitis A Antibody, IgM                             NEG                         NEGATIVE   Hepatitis C Antibody      NEG                         NEGATIVE

## 2010-06-30 NOTE — Assessment & Plan Note (Signed)
Summary: bp check/men/cd---coming at 3pm/cd   Nurse Visit   Vital Signs:  Patient profile:   50 year old male Pulse rate:   68 / minute BP sitting:   128 / 80  (left arm)  Vitals Entered By: Lamar Sprinkles, CMA (December 31, 2009 3:01 PM) CC: BP CHECK - New Med Comments No c/o on new med, will continue to check bp at home and report to the office.    Allergies: No Known Drug Allergies  Orders Added: 1)  Est. Patient Level I [[address]]

## 2010-06-30 NOTE — Assessment & Plan Note (Signed)
Summary: BP CK/ HAS BEEN ELEVATED/ MEN /NWS  Medications Added CARTIA XT 180 MG XR24H-CAP (DILTIAZEM HCL COATED BEADS) 1 by mouth once daily       Nurse Visit   Vital Signs:  Patient profile:   50 year old male BP sitting:   156 / 106  (left arm)  Allergies: No Known Drug Allergies Prescriptions: CARTIA XT 180 MG XR24H-CAP (DILTIAZEM HCL COATED BEADS) 1 by mouth once daily  #30 x 5   Entered and Authorized by:   Jacques Navy MD   Signed by:   Jacques Navy MD on 12/29/2009   Method used:   [facility] [address] [facility]       [address]       [place], [place]  [address]       Ph: [phone] or [phone]       Fax: [phone]   RxID:   [id number]

## 2010-06-30 NOTE — Assessment & Plan Note (Signed)
Summary: ACU/OVER TAKEN MEDS?/JSS   Vital Signs:  Patient profile:   50 year old male Height:      71 inches Weight:      227 pounds BMI:     31.77 O2 Sat:      95 % on Room air Temp:     98.6 degrees F oral Pulse rate:   60 / minute BP sitting:   138 / 92  (left arm) Cuff size:   regular  Vitals Entered By: Bill Salinas CMA (January 03, 2010 4:37 PM)  O2 Flow:  Room air CC: office visit to discuss BP medication/ ab   Primary Care Provider:  Norins  CC:  office visit to discuss BP medication/ ab.  History of Present Illness: Patient had a rise in BP last week that coincided with dental pain. He had nurse visit last Friday - BP elevated and he was prescribed diltiazem 180. It turns out he was already on diltiazem 180 so he has  been taking 360mg  daily. ON this regimen his blood pressure has been good.   Current Medications (verified): 1)  Hyzaar 100-25 Mg  Tabs (Losartan Potassium-Hctz) .... Take 1 Tablet By Mouth Once A Day 2)  Adult Aspirin Low Strength 81 Mg  Tbdp (Aspirin) .... Take 1 Tablet By Mouth Once A Day 3)  Simvastatin 40 Mg  Tabs (Simvastatin) .Marland Kitchen.. 1 By Mouth Qpm 4)  Goodys Extra Strength [id number] Mg  Pack (Aspirin-Acetaminophen-Caffeine) .... As Directed As Needed 5)  Cartia Xt 180 Mg Xr24h-Cap (Diltiazem Hcl Coated Beads) .Marland Kitchen.. 1 By Mouth Once Daily  Allergies (verified): No Known Drug Allergies  Past History:  Past Medical History: Last updated: 03/13/2008 Hypertension Hyperlipidemia skin cancer-basal cell face and abdomen  Past Surgical History: Last updated: 03/13/2008 Hernia sx-age 18 Eye sx-child Excision of basal cell carcinomas  Fx 5th digit left hand x 2 PSH reviewed for relevance, FH reviewed for relevance  Review of Systems  The patient denies chest pain, syncope, and peripheral edema.    Physical Exam  General:  Well-developed,well-nourished,in no acute distress; alert,appropriate and cooperative throughout examination Lungs:   normal respiratory effort.   Heart:  normal rate and regular rhythm.     Impression & Recommendations:  Problem # 1:  HYPERTENSION (ICD-401.9) Patient with elevated BP last week now better. Question if rise was due to pain or if it was truly poor control. He was advised that 360mg  once daily of diltiazem was a regular dose.  Plan - return to previous regimen and check BP           report back on  BP.  His updated medication list for this problem includes:    Hyzaar 100-25 Mg Tabs (Losartan potassium-hctz) .Marland Kitchen... Take 1 tablet by mouth once a day    Cartia Xt 180 Mg Xr24h-cap (Diltiazem hcl coated beads) .Marland Kitchen... 1 by mouth once daily  Complete Medication List: 1)  Hyzaar 100-25 Mg Tabs (Losartan potassium-hctz) .... Take 1 tablet by mouth once a day 2)  Adult Aspirin Low Strength 81 Mg Tbdp (Aspirin) .... Take 1 tablet by mouth once a day 3)  Simvastatin 40 Mg Tabs (Simvastatin) .Marland Kitchen.. 1 by mouth qpm 4)  Goodys Extra Strength [phone] Mg Pack (Aspirin-acetaminophen-caffeine) .... As directed as needed 5)  Cartia Xt 180 Mg Xr24h-cap (Diltiazem hcl coated beads) .Marland Kitchen.. 1 by mouth once daily

## 2010-08-12 LAB — BASIC METABOLIC PANEL
BUN: 12 mg/dL (ref 6–23)
CO2: 29 mEq/L (ref 19–32)
Calcium: 9 mg/dL (ref 8.4–10.5)
Creatinine, Ser: 0.9 mg/dL (ref 0.4–1.5)
GFR calc Af Amer: >60 mL/min (ref >60)
GFR calc Af Amer: >60 mL/min (ref >60)
GFR calc non Af Amer: >60 mL/min (ref >60)
Potassium: 4.2 mEq/L (ref 3.5–5.1)
Sodium: 139 mEq/L (ref 135–145)

## 2010-08-12 LAB — CBC
HCT: 45.6 % (ref 39.0–52.0)
Hemoglobin: 15.6 g/dL (ref 13.0–17.0)
MCH: 34.7 pg — ABNORMAL HIGH (ref 26.0–34.0)
Platelets: 161 10*3/uL (ref 150–400)
RBC: 4.58 MIL/uL (ref 4.22–5.81)
RBC: 4.79 MIL/uL (ref 4.22–5.81)
RDW: 12 % (ref 11.5–15.5)
WBC: 4.3 10*3/uL (ref 4.0–10.5)
WBC: 4.7 10*3/uL (ref 4.0–10.5)

## 2010-08-12 LAB — D-DIMER, QUANTITATIVE
D-Dimer, Quant: 0.31 ug/mL-FEU (ref 0.00–0.48)
D-Dimer, Quant: 0.38 ug/mL-FEU (ref 0.00–0.48)

## 2010-08-12 LAB — CARDIAC PANEL(CRET KIN+CKTOT+MB+TROPI)
CK, MB: 1.7 ng/mL (ref 0.3–4.0)
Troponin I: 0.01 ng/mL (ref 0.00–0.06)
Troponin I: 0.02 ng/mL (ref 0.00–0.06)

## 2010-08-12 LAB — PROTIME-INR: Prothrombin Time: 13.1 seconds (ref 11.6–15.2)

## 2010-10-11 NOTE — Assessment & Plan Note (Signed)
[facility]                            CARDIOLOGY OFFICE NOTE   [name] [name], [name] [name]                     MRN:          [id number]  DATE:09/19/2007                            DOB:          [birth year]    PRIMARY CARE PHYSICIAN:  Rosalyn Gess. Norins, MD.   REASON FOR PRESENTATION:  Evaluate patient with chest pain.   HISTORY OF PRESENT ILLNESS:  The patient is a pleasant 50 year old  gentleman without prior cardiac history.  He did have an EKG suggesting  a possible anterior infarct.  Dr. Debby Bud saw this last year and ordered  a stress perfusion study.  This demonstrated an EF of 59% with no  evidence of ischemia or infarct.   The patient had been doing well.  However last week, he had an episode  of discomfort.  It was under his left clavicle.  It went behind his  shoulder blade.  It was a squeezing discomfort.  It happened at rest.  He drove to [facility] where he got some aspirin and took some.  It lasted for  20 minutes.  It was 7/10 in intensity.  He was anxious with it.  He was  not nauseated.  He was not having shortness of breath.  He did not have  any discomfort in his arm or up to his neck.  He was not particularly  short of breath.  He has had reflux and other GI symptoms in the past  and he did not think this was the same type of discomfort.  Since that  time, he has had none of this.  He was able to carry boxes up and down  stairs moving from one apartment to the next recently.  With this, he  did not get any of this chest discomfort this weekend.  He has not had  any palpitation, presyncope, syncope.  He denies any PND or orthopnea.   PAST MEDICAL HISTORY:  1. Hypertension since age 20.  2. Hyperlipidemia since age 59.  3. Skin cancer.   PAST SURGICAL HISTORY:  1. Right inguinal hernia repair.  2. Skin cancer resected.   ALLERGIES:  NONE.   CURRENT MEDICATIONS:  1. Aspirin 81 mg daily.  2. Sublingual nitroglycerin.  3. Simvastatin 40  mg daily.  4. Diltiazem 180 mg daily.  5. Hyzaar 50/12.5 daily.   SOCIAL HISTORY:  The patient is self-employed.  He is divorced.  He has  a 86 year old daughter.  He does not smoke cigarettes.  He drinks  alcohol rarely.   FAMILY HISTORY:  Contributory for his mother having a mild heart  attack in her 35s.  She is still alive and well at age 80.  His father  had multiple medical problems with alcohol use, very unhealthy lifestyle  and died at 73 with heart related problems.  He has multiple siblings  with no heart problems.   REVIEW OF SYSTEMS:  As stated in the HPI and otherwise negative for  other systems.   PHYSICAL EXAMINATION:  GENERAL:  The patient is pleasant and in no  distress.  VITAL SIGNS:  Blood  pressure 126/86, heart rate 72 and regular, weight  231 pounds.  HEENT:  Eyes are unremarkable.  Pupils are equal, round and reactive to  light.  Fundi not visualized.  Oral mucosa unremarkable.  NECK:  No jugular venous distention at 45 degrees.  Carotid upstroke  brisk and symmetrical.  No bruits, no thyromegaly.  LYMPHATICS:  No cervical, axillary or inguinal adenopathy.  LUNGS:  Clear to auscultation bilaterally.  BACK:  No costovertebral angle tenderness.  CHEST:  Unremarkable.  HEART:  PMI not displaced or sustained, S1-S2 within normal limits with  no S3-S4, no clicks, rubs, murmurs.  ABDOMEN:  Mildly obese, positive bowel sounds normal in frequency and  pitch.  No bruits, rebound, guarding, no midline pulsatile mass.  No  hepatomegaly, no splenomegaly.  SKIN:  No rashes, no nodules.  EXTREMITIES:  2+ pulses throughout, no edema, cyanosis, clubbing.  NEURO:  Oriented to person, place, time.  Cranial nerves II-XII are  grossly intact, motor grossly intact.   DIAGNOSTICS:  EKG:  Sinus rhythm, rate 60, axis within normal limits,  intervals within normal limits, no acute ST/T wave changes, poor  anterior R-wave progression related to lead placement.    ASSESSMENT/PLAN:  1. Chest discomfort.  The patient's chest discomfort was somewhat      atypical.  He had a negative stress perfusion study last year.      Since he had his chest discomfort, he has had no recurrent symptoms      and he has been able to be very vigorously active.  Given all of      this, I think the pretest probability that his symptoms were      related to obstructive coronary disease is extremely low.  Unless      he has recurrent symptoms, I would not suggest further      cardiovascular testing.  Rather this was probably something GI.  If      however he has recurrent symptoms, I would like to see him back and      would consider further imaging.  Cardiac CT would probably be the      appropriate test at that point.  2. Dyslipidemia.  He has recently been given a prescription to restart      statins.  He had some mildly elevated liver enzymes and was taken      off of Lipitor in the past.  This will be managed per Dr. Debby Bud.  3. Hypertension.  Blood pressure is well controlled and he will      continue the medications as listed.  4. Weight.  We talked about the need to lose weight with diet and      exercise.  He stopped exercising which he used to do routinely.  I      have encouraged him to get back to some aerobic regimen.   FOLLOW UP:  I will see him back as needed.     Rollene Rotunda, MD, [facility]  Electronically Signed    JH/MedQ  DD: 09/19/2007  DT: 09/19/2007  Job #: [id number]   cc:   Rosalyn Gess. Norins, MD

## 2010-10-14 NOTE — Assessment & Plan Note (Signed)
[facility]                           PRIMARY CARE OFFICE NOTE   [name], [name]                     MRN:          [id number]  DATE:09/25/2006                            DOB:          06-30-1960    HISTORY OF PRESENT ILLNESS:  Mr. [name] [name] presents for follow up of his  blood pressure.  He was seen last week at which time his medication was  increased to Hyzaar 100/25.  On presentation today, his blood pressure  was 147/94 on the right, 141/95 on the left and still suboptimally  controlled with a heart rate of 58.   PLAN:  The patient is discharged on Diltiazem CD 180 mg daily, 6 day  samples provided.  He will return on Monday for follow up blood pressure  check.  We discussed the fact that if he is not well controlled on these  drugs, then we need to consider doing re-artery stenosis evaluation.     [name] Gess Norins, MD  Electronically Signed    MEN/MedQ  DD: 09/25/2006  DT: 09/25/2006  Job #: [id number]

## 2010-10-14 NOTE — Assessment & Plan Note (Signed)
[facility]                           PRIMARY CARE OFFICE NOTE   ZYHEIR, DAFT                     MRN:          [id number]  DATE:07/27/2006                            DOB:          1961-05-22    Mr. [name] [name] is a 50 year old gentleman who presents for followup  evaluation and exam.  He was last seen in the office June 27, 2006  for followup of his labs.  We discussed, at that time, his elevated  cholesterol.  His calculated cardiac risk was 8% with an average of 11%.  The patient has been trying lifestyle management to bring his  cholesterol levels down.   PAST MEDICAL HISTORY:   SURGICAL:  1. Hernia repair in the past.  2. Vasectomy.   MEDICAL ILLNESS:  1. Usual childhood disease.  2. Hypertension.   FAMILY HISTORY:  Father with hypertension, diabetes, and a history of  myocardial infarction at a young age.  Mother with hypertension.  Paternal grandfather with diabetes and myocardial infarction x4.  Paternal grandmother with hypertension, myocardial infarction, and  cerebrovascular accident.  Maternal grandmother with hypertension and  myocardial infarction.   CURRENT MEDICATIONS:  1. Norvasc 10 mg daily.  2. Hydrochlorothiazide 12.5 mg daily.   SOCIAL HISTORY:  The patient is married 5 years to his third wife.  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   REVIEW OF SYSTEMS:  Negative for any constitutional problems.  It has  been more than 2 years since he has had an eye exam.  No ENT,  cardiovascular, respiratory, GI, or GU complaints.   EXAMINATION:  Temperature is 96.6, blood pressure 147/98, pulse 74,  weight 218.  GENERAL APPEARANCE:  Well-nourished, athletic-appearing gentleman in no  acute distress.  HEENT:  Normocephalic, atraumatic.  EACs and TMs unremarkable.  Oropharynx with native dentition in good repair.  No buccal or palatal  lesion were noted.  Posterior pharynx was  clear.  Conjunctivae and  sclerae clear.  PERRLA.  EOMI.  Funduscopic exam was unremarkable.  NECK:  Supple without thyromegaly.  NODES:  No adenopathy was noted in the cervical or supraclavicular  regions.  CHEST:  No CVA tenderness.  LUNGS:  Clear to auscultation and percussion.  CARDIOVASCULAR:  2+ radial pulse.  No JVD or carotid bruits.  He had a  quiet precordium with regular rate and rhythm without murmurs, rubs, or  gallops.  ABDOMEN:  Soft.  No guarding or rebound.  No organo-splenomegaly was  appreciated.  GENITALIA:  Normal.  Bilaterally descended testicles.  RECTAL:  Normal sphincter tone.  Prostate was smooth, round with normal  size and contour without nodules.  EXTREMITIES:  Without cyanosis, clubbing, edema, or deformity.  NEUROLOGIC:  Nonfocal.   DATABASE:  Laboratory from July 24, 2006 with an LDL cholesterol of  147.3, total cholesterol 224, triglycerides 179, HDL 51.2.  Urinalysis  was negative.  Thyroid function normal with a TSH of 2.11.  CBC was  normal with a hemoglobin of 16.4 g, white count was 4,600 with a normal  differential.  Liver functions revealed  SGOT of 57, SGPT of 72.  Chemistries revealed a glucose of 105, creatinine of 0.5, potassium was  normal at 3.6.   A 12-lead electrocardiogram revealed a normal sinus bradycardia with no  Q waves in V1, V2 suggestive of an old septal infarct.  No acute changes  are noted.   ASSESSMENT AND PLAN:  1. Hypertension.  The patient is poorly controlled at this time.  Plan      is to discontinue Norvasc.  Will start the patient on Cozaar 50 mg      daily, and we will use Hyzaar 50/12.5 combination.  2. Lipids.  The patient has been on a low-fat diet and exercising for      1 month with essentially no change in his lipids.  I suspect this      may be genetic given his positive family history.  Plan:  the      patient is started on Lipitor 20 mg daily.  He will return in 3      weeks for laboratory.  Goal for  this patient will be an LDL of 100      or less given his cardiac risk factors.  3. Cardiovascular.  The patient with multiple cardiac risk factors      including male gender, hypertension, hyperlipidemia, and very      positive family history.  He now has an abnormal EKG.  Plan:  The      patient is set for a stress-only Cardiolite study for July 30, 2006      due to his risk factors and abnormal EKG study.  The patient is      aware of this appointment.   SUMMARY:  This is a pleasant gentleman who appears healthy, but has  metabolic abnormalities as noted above.  He will be notified of the  results of his stress study when available, as well as his followup  laboratories.     Rosalyn Gess Norins, MD  Electronically Signed    MEN/MedQ  DD: 07/28/2006  DT: 07/28/2006  Job #: [id number]   cc:   Holly Bodily

## 2010-10-14 NOTE — Assessment & Plan Note (Signed)
[facility]                           PRIMARY CARE OFFICE NOTE   [name] [name], [name] [name]                     MRN:          [id number]  DATE:09/21/2006                            DOB:          09-Jun-1960    [name] [name] presents for a blood pressure check.  He was last seen August 16, 2006 to discuss test results.  At that time, his blood pressure was  137/85 on Hyzaar 50/12.5, which had been started July 27, 2006.  The  patient reports he has been under some stress.  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  He is asymptomatic.  He  has had no nosebleeds.  He has had no double vision.  He has had no  chest pain or other symptoms.   ASSESSMENT AND PLAN:  Hypertension.  This may be exacerbated by recent  stress.  However, these blood pressures are worrisome and need to be  treated.   PLAN:  Double Hyzaar to 100/25.  The patient is to return on Tuesday  April 29 for a blood pressure check.  If it continues to be elevated at  that time, would add a calcium channel blocker to his therapy.  If he  fails a 3-drug regimen, would need to pursue further evaluation to  include evaluation for renal artery stenosis.     Rosalyn Gess Norins, MD  Electronically Signed    MEN/MedQ  DD: 09/21/2006  DT: 09/21/2006  Job #: [id number]

## 2010-11-05 ENCOUNTER — Other Ambulatory Visit: Payer: Self-pay | Admitting: Internal Medicine

## 2011-01-07 ENCOUNTER — Other Ambulatory Visit: Payer: Self-pay | Admitting: Internal Medicine

## 2011-03-09 ENCOUNTER — Other Ambulatory Visit: Payer: Self-pay | Admitting: Internal Medicine

## 2011-03-21 ENCOUNTER — Ambulatory Visit: Payer: BC Managed Care – PPO | Admitting: *Deleted

## 2011-03-21 VITALS — BP 138/88

## 2011-03-21 DIAGNOSIS — I10 Essential (primary) hypertension: Secondary | ICD-10-CM

## 2011-03-28 ENCOUNTER — Ambulatory Visit: Payer: BC Managed Care – PPO | Admitting: *Deleted

## 2011-03-28 VITALS — BP 130/80

## 2011-03-28 DIAGNOSIS — I10 Essential (primary) hypertension: Secondary | ICD-10-CM

## 2011-04-18 ENCOUNTER — Other Ambulatory Visit: Payer: Self-pay | Admitting: Student

## 2011-04-18 MED ORDER — DILTIAZEM HCL ER COATED BEADS 360 MG PO TB24: 360.0000 mg | ORAL_TABLET | Freq: Every day | ORAL | Status: DC

## 2011-06-12 ENCOUNTER — Other Ambulatory Visit: Payer: Self-pay | Admitting: *Deleted

## 2011-06-12 MED ORDER — LOSARTAN POTASSIUM-HCTZ 100-25 MG PO TABS: 1.0000 | ORAL_TABLET | Freq: Every day | ORAL | Status: DC

## 2011-06-19 IMAGING — CR DG CHEST 1V PORT
1 series · 1 of 1 positions shown · non-contrast
Comparison: None.

CLINICAL DATA: Chest pain.  Rule out myocardial infarction.  CHF.

PORTABLE CHEST - 1 VIEW

[view not recorded]
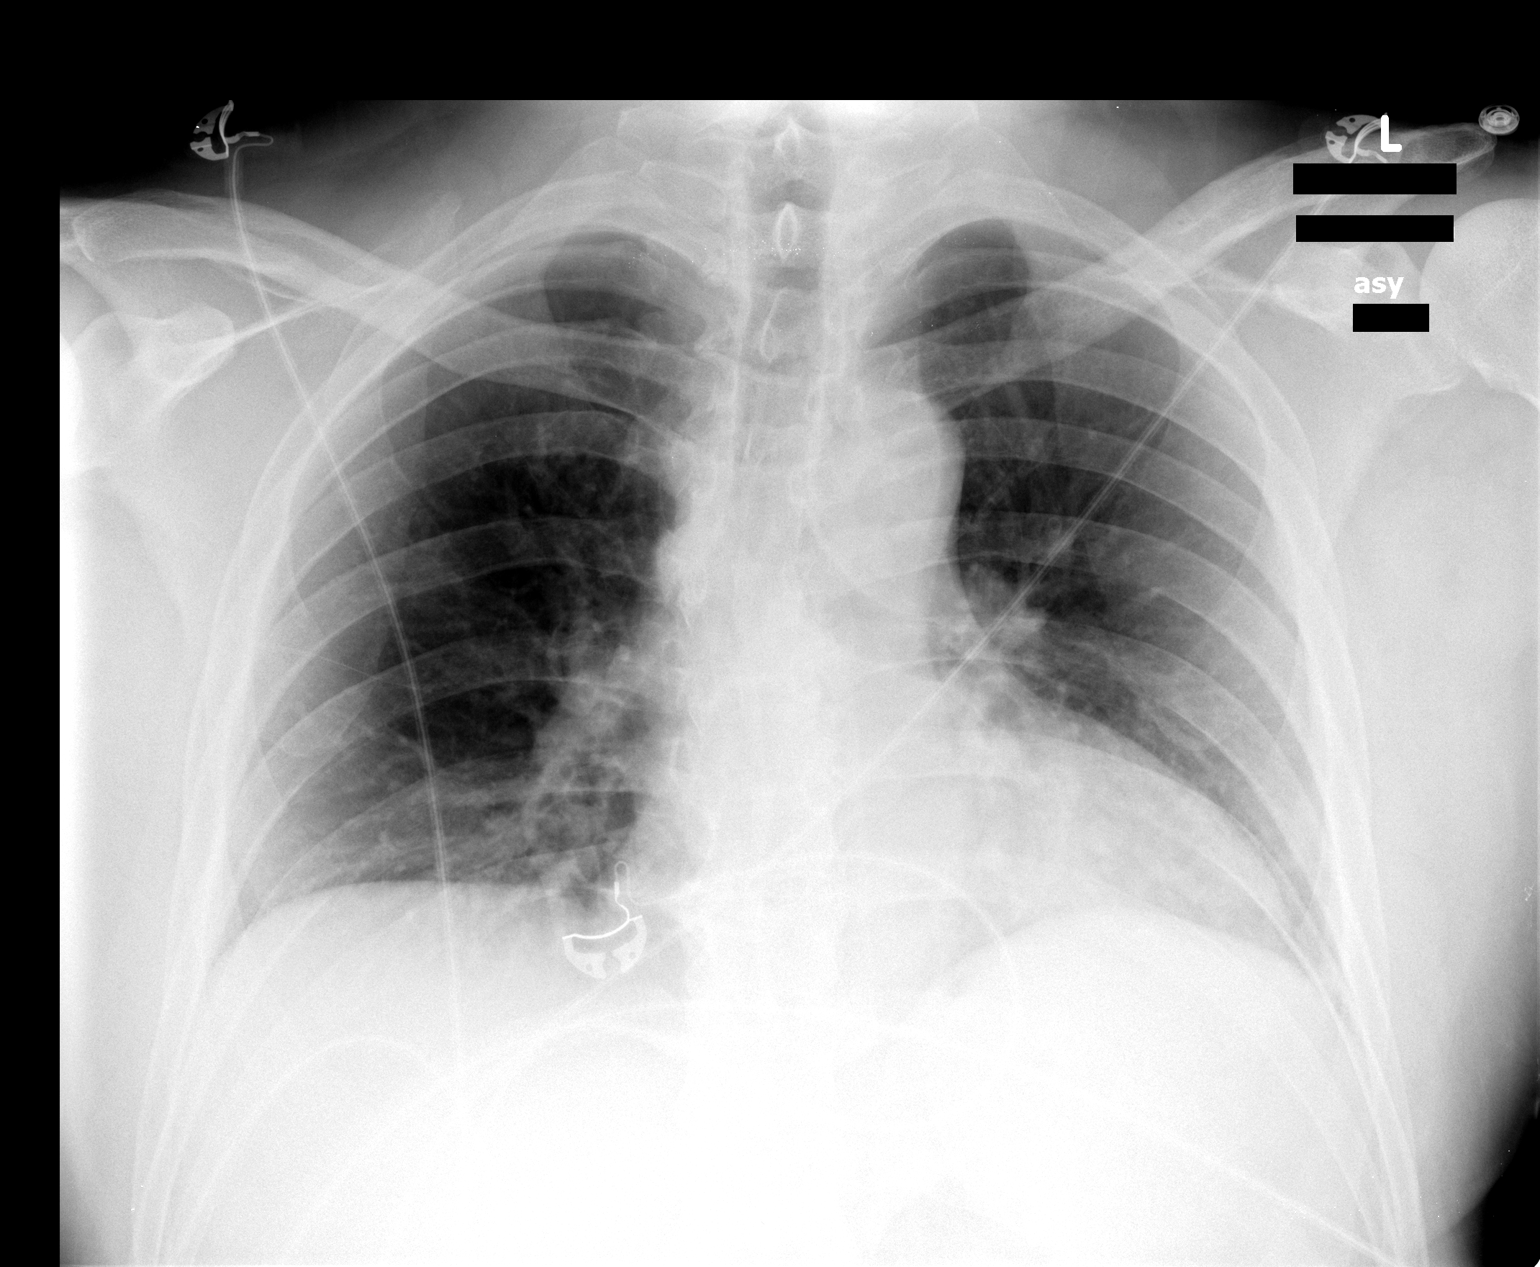

[1 of 1 positions shown; findings below may reference images not displayed]

FINDINGS: Midline trachea.  Mild cardiomegaly, accentuated by AP
portable technique. Mediastinal contours otherwise within normal
limits.  No pleural effusion or pneumothorax.  Lung volumes are
mildly low. No congestive failure.

Mild bibasilar atelectasis, greater left than right.
IMPRESSION: 1.  Mild cardiomegaly and low lung volumes.  No congestive failure
or acute process.
2.  Mild bibasilar atelectasis.

## 2011-06-26 ENCOUNTER — Ambulatory Visit: Payer: BC Managed Care – PPO | Admitting: Internal Medicine

## 2011-07-03 ENCOUNTER — Ambulatory Visit: Payer: BC Managed Care – PPO | Admitting: Internal Medicine

## 2011-07-10 ENCOUNTER — Ambulatory Visit (INDEPENDENT_AMBULATORY_CARE_PROVIDER_SITE_OTHER): Payer: BC Managed Care – PPO | Admitting: Internal Medicine

## 2011-07-10 ENCOUNTER — Other Ambulatory Visit (INDEPENDENT_AMBULATORY_CARE_PROVIDER_SITE_OTHER): Payer: BC Managed Care – PPO

## 2011-07-10 ENCOUNTER — Encounter: Payer: Self-pay | Admitting: Internal Medicine

## 2011-07-10 VITALS — BP 118/88 | HR 67 | Temp 97.3°F | Resp 16 | Wt 217.5 lb

## 2011-07-10 DIAGNOSIS — Z0000 Encounter for general adult medical examination without abnormal findings: Secondary | ICD-10-CM

## 2011-07-10 DIAGNOSIS — E785 Hyperlipidemia, unspecified: Secondary | ICD-10-CM

## 2011-07-10 DIAGNOSIS — I10 Essential (primary) hypertension: Secondary | ICD-10-CM

## 2011-07-10 DIAGNOSIS — I2510 Atherosclerotic heart disease of native coronary artery without angina pectoris: Secondary | ICD-10-CM

## 2011-07-10 LAB — COMPREHENSIVE METABOLIC PANEL
AST: 27 U/L (ref 0–37)
Albumin: 4.9 g/dL (ref 3.5–5.2)
Alkaline Phosphatase: 26 U/L — ABNORMAL LOW (ref 39–117)
BUN: 15 mg/dL (ref 6–23)
CO2: 27 mEq/L (ref 19–32)
Calcium: 9.5 mg/dL (ref 8.4–10.5)
Potassium: 3.4 mEq/L — ABNORMAL LOW (ref 3.5–5.1)
Total Bilirubin: 0.9 mg/dL (ref 0.3–1.2)
Total Protein: 8 g/dL (ref 6.0–8.3)

## 2011-07-10 LAB — LIPID PANEL
Cholesterol: 223 mg/dL — ABNORMAL HIGH (ref 0–200)
HDL: 54 mg/dL (ref >39.00)
VLDL: 33 mg/dL (ref 0.0–40.0)

## 2011-07-10 LAB — HEPATIC FUNCTION PANEL: Total Protein: 8 g/dL (ref 6.0–8.3)

## 2011-07-11 LAB — LDL CHOLESTEROL, DIRECT: Direct LDL: 144.1 mg/dL

## 2011-07-12 DIAGNOSIS — Z0000 Encounter for general adult medical examination without abnormal findings: Secondary | ICD-10-CM | POA: Insufficient documentation

## 2011-07-12 MED ORDER — SIMVASTATIN 20 MG PO TABS: 20.0000 mg | ORAL_TABLET | Freq: Every day | ORAL | Status: DC

## 2011-07-12 NOTE — Assessment & Plan Note (Signed)
BP Readings from Last 3 Encounters:  07/10/11 118/88  03/28/11 130/80  03/21/11 138/88   Good control on present medications. Will continue the same.

## 2011-07-12 NOTE — Assessment & Plan Note (Signed)
Mr. [place] with a positive family history, HTN and mild hyperlipidemia. He has been asymptomatic with very vigorous activity. He does have non-obstructive disease as of Cath Aug '11 - 50% proximal septal vessel, 30% distal vessel; Circumflex with 30-40% lesion.  Plan - risk modification:            Continue to control blood pressure           Treat for elevated cholesterol with a goal of LDL of 80 or less           Continued exercise.

## 2011-07-12 NOTE — Assessment & Plan Note (Signed)
Lab reveals LDL of 144 and an excellent HDL of 54. He previously had an LDL of 87 while on medication. Goal is an LDL of less than 80 in a setting of non-obstructive coronary artery disease.  Plan - simvastatin 20 mg daily            Follow up lab in 4 weeks.

## 2011-07-12 NOTE — Assessment & Plan Note (Signed)
Interval medical history is good. Physical exam is normal. Lab results are good except that cholesterol levels, LDL, is above goal. He is due for colorectal cancer screening with colonoscopy and will call when ready to be scheduled. He is due for Tdap.  In summary - a nice man who is medically stable but in need of strict risk modification: continued BP control, cholesterol control. He will be started on "Statin" therapy and return in one month for lab.

## 2011-07-12 NOTE — Progress Notes (Signed)
  Subjective:    Patient ID: [name] [name], male    DOB: May 18, 1961, 51 y.o.   MRN: [id number]  HPI [name] [name] presents for general medical follow-up. He was last seen August '11 when he was admitted for evaluation of typical anginal symptoms. His cardiac enzymes were negative and his cardiac catherization was negative for any obstructive disease although he did have mild disease present. In the interval he has done well: no major illness, injury or surgery. He remains very active but does not get to work out like he would like between teenagers and work.   Past Medical History: Last updated: April 08, 2008 Hypertension Hyperlipidemia skin cancer-basal cell face and abdomen  Past Surgical History: Last updated: 04-08-08 Hernia sx-age 72 Eye sx-child Excision of basal cell carcinomas  Fx 5th digit left hand x 2  Family History: Last updated: 2008-04-08 Father- deceased @51 :: DM,HTN,MI x 7 Mother - 1940: HTN, lipid Family History Hypertension Family History MI Family History CVA Neg - prostate or colon cancer  Social History: Last updated: 04/08/08 HSG married '83- divorced 13 yrs; married '97- 2 years divorced; married '03- 3 years divorced. 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     Review of Systems Constitutional:  Negative for fever, chills, activity change and unexpected weight change.  HEENT:  Negative for hearing loss, ear pain, congestion, neck stiffness and postnasal drip. Negative for sore throat or swallowing problems. Negative for dental complaints.   Eyes: Negative for vision loss or change in visual acuity.  Respiratory: Negative for chest tightness and wheezing. Negative for DOE.   Cardiovascular: Negative for chest pain or palpitations. No decreased exercise tolerance Gastrointestinal: No change in bowel habit. No bloating or gas. No reflux or indigestion Genitourinary: Negative for urgency, frequency,  flank pain and difficulty urinating.  Musculoskeletal: Negative for myalgias, back pain, arthralgias and gait problem.  Neurological: Negative for dizziness, tremors, weakness and headaches.  Hematological: Negative for adenopathy.  Psychiatric/Behavioral: Negative for behavioral problems and dysphoric mood.         Objective:   Physical Exam Filed Vitals:   07/10/11 1511  BP: 118/88  Pulse: 67  Temp: 97.3 F (36.3 C)  Resp: 16  Weight: 217 lb 8 oz (98.657 kg)  There is no height on file to calculate BMI. Gen'l- well nourished white man who looks fit HEENT- C&S clear Cor - RRR, no murmur or rub Pulm - normal respirations, lungs are clear Abdomen - BS+ no tenderness Ext - no deformity, no signs of inflammation Neuro - A&O x 3, CN II-XII intact, MS - normal, normal gait  Lab Results Component Value Date  WBC 4.7 01/11/2010  HGB 15.6 01/11/2010  HCT 44.0 01/11/2010  PLT 164 01/11/2010  GLUCOSE 89 07/10/2011  CHOL 223* 07/10/2011  TRIG 165.0* 07/10/2011  HDL 54.00 07/10/2011  LDLDIRECT 144.1 07/10/2011  LDLCALC 87 05/27/2009      ALT 24 07/10/2011  AST 27 07/10/2011      NA 137 07/10/2011  K 3.4* 07/10/2011  CL 103 07/10/2011  CREATININE 0.8 07/10/2011  BUN 15 07/10/2011  CO2 27 07/10/2011  TSH 2.12 05/27/2009  PSA 0.81 05/27/2009  INR 0.97 01/10/2010         Assessment & Plan:

## 2011-09-07 ENCOUNTER — Other Ambulatory Visit: Payer: Self-pay | Admitting: Internal Medicine

## 2011-09-07 NOTE — Telephone Encounter (Signed)
Done

## 2011-10-15 ENCOUNTER — Other Ambulatory Visit: Payer: Self-pay | Admitting: Internal Medicine

## 2012-03-04 ENCOUNTER — Other Ambulatory Visit: Payer: Self-pay | Admitting: Internal Medicine

## 2012-04-10 ENCOUNTER — Other Ambulatory Visit: Payer: Self-pay | Admitting: Internal Medicine

## 2012-04-16 ENCOUNTER — Other Ambulatory Visit: Payer: Self-pay | Admitting: Internal Medicine

## 2012-04-17 ENCOUNTER — Other Ambulatory Visit: Payer: Self-pay | Admitting: *Deleted

## 2012-04-17 NOTE — Telephone Encounter (Signed)
Opened in error

## 2012-05-06 ENCOUNTER — Encounter: Payer: Self-pay | Admitting: Internal Medicine

## 2012-05-06 ENCOUNTER — Other Ambulatory Visit (INDEPENDENT_AMBULATORY_CARE_PROVIDER_SITE_OTHER): Payer: BC Managed Care – PPO

## 2012-05-06 ENCOUNTER — Ambulatory Visit (INDEPENDENT_AMBULATORY_CARE_PROVIDER_SITE_OTHER): Payer: BC Managed Care – PPO | Admitting: Internal Medicine

## 2012-05-06 VITALS — BP 128/80 | HR 75 | Temp 97.7°F | Ht 71.0 in | Wt 228.0 lb

## 2012-05-06 DIAGNOSIS — I498 Other specified cardiac arrhythmias: Secondary | ICD-10-CM

## 2012-05-06 DIAGNOSIS — I493 Ventricular premature depolarization: Secondary | ICD-10-CM

## 2012-05-06 DIAGNOSIS — I4902 Ventricular flutter: Secondary | ICD-10-CM

## 2012-05-06 DIAGNOSIS — I4949 Other premature depolarization: Secondary | ICD-10-CM

## 2012-05-06 LAB — MAGNESIUM: Magnesium: 2 mg/dL (ref 1.5–2.5)

## 2012-05-06 LAB — BASIC METABOLIC PANEL
BUN: 13 mg/dL (ref 6–23)
Chloride: 99 mEq/L (ref 96–112)
Creatinine, Ser: 1 mg/dL (ref 0.4–1.5)

## 2012-05-06 NOTE — Patient Instructions (Addendum)
Premature Ventricular Contraction Premature ventricular contraction (PVC) is an irregularity of the heart rhythm involving extra or skipped heartbeats. In some cases, they may occur without obvious cause or heart disease. Other times, they can be caused by an electrolyte change in the blood. These need to be corrected. They can also be seen when there is not enough oxygen going to the heart. A common cause of this is plaque or cholesterol buildup. This buildup decreases the blood supply to the heart. In addition, extra beats may be caused or aggravated by:  Excessive smoking.  Alcohol consumption.  Caffeine.  Certain medications  Some street drugs. SYMPTOMS   The sensation of feeling your heart skipping a beat (palpitations).  In many cases, the person may have no symptoms. SIGNS AND TESTS   A physical examination may show an occasional irregularity, but if the PVC beats do not happen often, they may not be found on physical exam.  Blood pressure is usually normal.  Other tests that may find extra beats of the heart are:  An EKG (electrocardiogram)  A Holter monitor which can monitor your heart over longer periods of time  An Angiogram (study of the heart arteries). TREATMENT  Usually extra heartbeats do not need treatment. The condition is treated only if symptoms are severe or if extra beats are very frequent or are causing problems. An underlying cause, if discovered, may also require treatment.  Treatment may also be needed if there may be a risk for other more serious cardiac arrhythmias.  PREVENTION   Moderation in caffeine, alcohol, and tobacco use may reduce the risk of ectopic heartbeats in some people.  Exercise often helps people who lead a sedentary (inactive) lifestyle. PROGNOSIS  PVC heartbeats are generally harmless and do not need treatment.  RISKS AND COMPLICATIONS   Ventricular tachycardia (occasionally).  There usually are no complications.  Other  arrhythmias (occasionally). SEEK IMMEDIATE MEDICAL CARE IF:   You feel palpitations that are frequent or continual.  You develop chest pain or other problems such as shortness of breath, sweating, or nausea and vomiting.  You become light-headed or faint (pass out).  You get worse or do not improve with treatment. Document Released: 12/31/2003 Document Revised: 08/07/2011 Document Reviewed: 07/12/2007 [employer] Patient Information 2013 [employer].  

## 2012-05-06 NOTE — Progress Notes (Signed)
Subjective:    Patient ID: [name] [name], male    DOB: 20-Aug-1960, 51 y.o.   MRN: [id number]  HPI  Pt presents to the clinic today with c/o of feeling like "my heart is fluttering" x 2 weeks. About 8 ears ago, he had the same feeling. He was seen by cardiology and diagnosed with afib bu tnever placed on anticoagulation. He has also has intermittent chest pains with the fluttering. It is a stabbing sensation that occurs and then immediately goes away. He denies chest tightness, shortness of breath, nausea or vomiting.   Review of Systems      No past medical history on file.  Current Outpatient Prescriptions  Medication Sig Dispense Refill  . losartan-hydrochlorothiazide (HYZAAR) 100-25 MG per tablet TAKE 1 TABLET BY MOUTH DAILY.  30 tablet  5  . MATZIM LA 360 MG 24 hr tablet TAKE 1 TABLET (360 MG TOTAL) BY MOUTH DAILY.  90 tablet  0  . simvastatin (ZOCOR) 20 MG tablet Take 1 tablet (20 mg total) by mouth at bedtime.  30 tablet  11    Allergies  Allergen Reactions  . Hydrocodone Nausea Only    No family history on file.  History   Social History  . Marital Status: Married    Spouse Name: N/A    Number of Children: N/A  . Years of Education: N/A   Occupational History  . Not on file.   Social History Main Topics  . Smoking status: Never Smoker   . Smokeless tobacco: Former Neurosurgeon     Comment: Dip Only.  . Alcohol Use: Not on file  . Drug Use: Not on file  . Sexually Active: Not on file   Other Topics Concern  . Not on file   Social History Narrative  . No narrative on file     Constitutional: Denies fever, malaise, fatigue, headache or abrupt weight changes.  Respiratory: Denies difficulty breathing, shortness of breath, cough or sputum production.   Cardiovascular: Pt reports palpitations. Denies chest pain, chest tightness or swelling in the hands or feet.  Gastrointestinal: Denies abdominal pain, bloating, constipation, diarrhea or blood in the stool.   Musculoskeletal: Denies decrease in range of motion, difficulty with gait, muscle pain or joint pain and swelling.   Neurological: Denies dizziness, difficulty with memory, difficulty with speech or problems with balance and coordination.   No other specific complaints in a complete review of systems (except as listed in HPI above).  Objective:   Physical Exam   BP 128/80  Pulse 75  Temp 97.7 F (36.5 C) (Oral)  Ht 5\' 11"  (1.803 m)  Wt 228 lb (103.42 kg)  BMI 31.80 kg/m2  SpO2 94% Wt Readings from Last 3 Encounters:  05/06/12 228 lb (103.42 kg)  07/10/11 217 lb 8 oz (98.657 kg)  01/10/10 218 lb (98.884 kg)    General: Appears his stated age, well developed, well nourished in NAD. Cardiovascular: Normal rate and rhythm. S1,S2 noted.  No murmur, rubs or gallops noted. No JVD or BLE edema. No carotid bruits noted. Pulmonary/Chest: Normal effort and positive vesicular breath sounds. No respiratory distress. No wheezes, rales or ronchi noted.  Neurological: Alert and oriented. Cranial nerves II-XII intact. Coordination normal. +DTRs bilaterally. Psychiatric: Mood and affect normal. Behavior is normal. Judgment and thought content normal.   EKG: SR with occasional PVC, old anteroseptal infarct  BMET    Component Value Date/Time   NA 137 07/10/2011 1558   K 3.4* 07/10/2011 1558  CL 103 07/10/2011 1558   CO2 27 07/10/2011 1558   GLUCOSE 89 07/10/2011 1558   GLUCOSE 93 06/08/2006 0746   BUN 15 07/10/2011 1558   CREATININE 0.8 07/10/2011 1558   CALCIUM 9.5 07/10/2011 1558   GFRNONAA >60 01/11/2010 0210   GFRAA  Value: >60        The eGFR has been calculated using the MDRD equation. This calculation has not been validated in all clinical situations. eGFR's persistently <60 mL/min signify possible Chronic Kidney Disease. 01/11/2010 0210    Lipid Panel     Component Value Date/Time   CHOL 223* 07/10/2011 1558   TRIG 165.0* 07/10/2011 1558   HDL 54.00 07/10/2011 1558   CHOLHDL 4 07/10/2011  1558   VLDL 33.0 07/10/2011 1558   LDLCALC 87 05/27/2009 0759    CBC    Component Value Date/Time   WBC 4.7 01/11/2010 0210   RBC 4.58 01/11/2010 0210   HGB 15.6 01/11/2010 0210   HCT 44.0 01/11/2010 0210   PLT 164 01/11/2010 0210   MCV 96.1 01/11/2010 0210   MCH 34.1* 01/11/2010 0210   MCHC 35.5 01/11/2010 0210   RDW 12.1 01/11/2010 0210   LYMPHSABS 1.5 05/27/2009 0759   MONOABS 0.4 05/27/2009 0759   EOSABS 0.1 05/27/2009 0759   BASOSABS 0.0 05/27/2009 0759    Hgb A1C No results found for this basename: HGBA1C        Assessment & Plan:   PVC's, new onset with additional workup required:  Will obtain EKG. Will check serum electrolytes  If fluttering gets worse or experiences chest pain, thighness, shortness of breath, nausea or vomiting, go to the ER immediatly

## 2012-05-07 ENCOUNTER — Other Ambulatory Visit: Payer: Self-pay | Admitting: Internal Medicine

## 2012-05-07 DIAGNOSIS — E876 Hypokalemia: Secondary | ICD-10-CM

## 2012-05-07 MED ORDER — POTASSIUM CHLORIDE ER 10 MEQ PO TBCR: 10.0000 meq | EXTENDED_RELEASE_TABLET | Freq: Two times a day (BID) | ORAL | Status: DC

## 2012-05-07 NOTE — Progress Notes (Signed)
Ash, He can f/u in 1 month to have his potassium level checked and also to see if he is still having the heart palpitations. Thx! Rene Kocher

## 2012-05-07 NOTE — Progress Notes (Signed)
Potassium low on HCTZ with PVC's. Will order potassium supplement.

## 2012-06-22 ENCOUNTER — Other Ambulatory Visit: Payer: Self-pay | Admitting: Internal Medicine

## 2012-06-28 ENCOUNTER — Telehealth: Payer: Self-pay | Admitting: Internal Medicine

## 2012-06-28 NOTE — Telephone Encounter (Signed)
Patient needs a refill on his Potassium supplement, states the pharmacy has tried to contact us but they have not heard back, he would like this called in to [facility]

## 2012-07-01 MED ORDER — POTASSIUM CHLORIDE ER 10 MEQ PO TBCR: 10.0000 meq | EXTENDED_RELEASE_TABLET | Freq: Two times a day (BID) | ORAL | Status: DC

## 2012-07-02 ENCOUNTER — Other Ambulatory Visit: Payer: Self-pay | Admitting: *Deleted

## 2012-07-02 DIAGNOSIS — E785 Hyperlipidemia, unspecified: Secondary | ICD-10-CM

## 2012-07-02 MED ORDER — SIMVASTATIN 20 MG PO TABS: 20.0000 mg | ORAL_TABLET | Freq: Every day | ORAL | Status: DC

## 2012-07-02 MED ORDER — LOSARTAN POTASSIUM-HCTZ 100-25 MG PO TABS: 1.0000 | ORAL_TABLET | Freq: Every day | ORAL | Status: DC

## 2012-07-16 ENCOUNTER — Other Ambulatory Visit: Payer: Self-pay | Admitting: Internal Medicine

## 2012-08-19 ENCOUNTER — Encounter: Payer: BC Managed Care – PPO | Admitting: Internal Medicine

## 2012-09-18 ENCOUNTER — Encounter: Payer: BC Managed Care – PPO | Admitting: Internal Medicine

## 2012-10-03 ENCOUNTER — Other Ambulatory Visit: Payer: Self-pay | Admitting: Internal Medicine

## 2012-10-16 ENCOUNTER — Other Ambulatory Visit: Payer: Self-pay | Admitting: Internal Medicine

## 2012-10-18 ENCOUNTER — Telehealth: Payer: Self-pay | Admitting: Internal Medicine

## 2012-10-18 ENCOUNTER — Other Ambulatory Visit: Payer: Self-pay | Admitting: Internal Medicine

## 2012-10-18 NOTE — Telephone Encounter (Signed)
Patient is out of matzim la 260 mg - [facility] [address] [facility] [place] - patient has tried getting refill through pharmacy but has not had any luck.

## 2012-10-18 NOTE — Telephone Encounter (Signed)
I called patients pharmacy to check on his prescription, I was advised it was ready. I called pt to let him know.

## 2012-11-07 ENCOUNTER — Encounter: Payer: BC Managed Care – PPO | Admitting: Internal Medicine

## 2013-01-01 ENCOUNTER — Encounter: Payer: Self-pay | Admitting: Internal Medicine

## 2013-01-01 ENCOUNTER — Ambulatory Visit (INDEPENDENT_AMBULATORY_CARE_PROVIDER_SITE_OTHER): Payer: BC Managed Care – PPO | Admitting: Internal Medicine

## 2013-01-01 ENCOUNTER — Other Ambulatory Visit (INDEPENDENT_AMBULATORY_CARE_PROVIDER_SITE_OTHER): Payer: BC Managed Care – PPO

## 2013-01-01 VITALS — BP 130/86 | HR 62 | Temp 98.9°F | Ht 71.0 in | Wt 220.0 lb

## 2013-01-01 DIAGNOSIS — E785 Hyperlipidemia, unspecified: Secondary | ICD-10-CM

## 2013-01-01 DIAGNOSIS — I10 Essential (primary) hypertension: Secondary | ICD-10-CM

## 2013-01-01 DIAGNOSIS — Z0000 Encounter for general adult medical examination without abnormal findings: Secondary | ICD-10-CM

## 2013-01-01 DIAGNOSIS — Z125 Encounter for screening for malignant neoplasm of prostate: Secondary | ICD-10-CM

## 2013-01-01 DIAGNOSIS — Z1211 Encounter for screening for malignant neoplasm of colon: Secondary | ICD-10-CM

## 2013-01-01 DIAGNOSIS — Z23 Encounter for immunization: Secondary | ICD-10-CM

## 2013-01-01 DIAGNOSIS — I2510 Atherosclerotic heart disease of native coronary artery without angina pectoris: Secondary | ICD-10-CM

## 2013-01-01 LAB — HEPATIC FUNCTION PANEL
ALT: 34 U/L (ref 0–53)
AST: 29 U/L (ref 0–37)
Bilirubin, Direct: 0.2 mg/dL (ref 0.0–0.3)
Total Bilirubin: 1 mg/dL (ref 0.3–1.2)

## 2013-01-01 LAB — COMPREHENSIVE METABOLIC PANEL
Albumin: 5 g/dL (ref 3.5–5.2)
Alkaline Phosphatase: 30 U/L — ABNORMAL LOW (ref 39–117)
BUN: 15 mg/dL (ref 6–23)
CO2: 29 mEq/L (ref 19–32)
GFR: 75.76 mL/min (ref >60.00)
Glucose, Bld: 88 mg/dL (ref 70–99)
Total Bilirubin: 1 mg/dL (ref 0.3–1.2)

## 2013-01-01 LAB — LIPID PANEL
Cholesterol: 145 mg/dL (ref 0–200)
LDL Cholesterol: 59 mg/dL (ref 0–99)
Triglycerides: 197 mg/dL — ABNORMAL HIGH (ref 0.0–149.0)

## 2013-01-01 NOTE — Progress Notes (Signed)
Subjective:    Patient ID: [name] [name], male    DOB: July 03, 1960, 52 y.o.   MRN: [id number]  HPI [name] [name] presents for general physical exam. He was seen in Dec '13 for irregular heart rhythm: EKG was normal. He has had a "flutter" in the past. He does recall wearing a holter and was told he had nothing that required intervention. For the past several weeks he has had 8-10 times/day discomfort left chest that  Is not exeretion related. Fleeting discomfort that will wax and wane over a period of seconds. NO diaphoresis, no SOB. No associated flutter or cough. He had a normal myoview in 2009. He had a cath 2011: LM Calcium, LAD 50%, diag 30%, Cx 30-40%. His discomfort is very atypical and may be GI.  He is otherwise well. He has not been to the dentist, is due for eye exam. Feels fit - works hard. Has had a grandchild      History   Social History  . Marital Status: Married    Spouse Name: N/A    Number of Children: N/A  . Years of Education: N/A   Occupational History  . Not on file.   Social History Main Topics  . Smoking status: Never Smoker   . Smokeless tobacco: Former Neurosurgeon     Comment: Dip Only.  . Alcohol Use: Yes  . Drug Use: No  . Sexually Active: Not on file   Other Topics Concern  . Not on file   Social History Narrative  . No narrative on file          Review of Systems Constitutional:  Negative for fever, chills, activity change and unexpected weight change.  HEENT:  Negative for hearing loss, ear pain, congestion, neck stiffness and postnasal drip. Negative for sore throat or swallowing problems. Negative for dental complaints.   Eyes: Negative for vision loss or change in visual acuity.  Respiratory: Negative for chest tightness and wheezing. Negative for DOE.   Cardiovascular: Negative for chest pain or palpitations. No decreased exercise tolerance Gastrointestinal: No change in bowel habit. No bloating or gas. No reflux or  indigestion Genitourinary: Negative for urgency, frequency, flank pain and difficulty urinating.  Musculoskeletal: Negative for myalgias, back pain, arthralgias and gait problem.  Neurological: Negative for dizziness, tremors, weakness and headaches.  Hematological: Negative for adenopathy.  Psychiatric/Behavioral: Negative for behavioral problems and dysphoric mood.       Objective:   Physical Exam Filed Vitals:   01/01/13 1439  BP: 130/86  Pulse: 62  Temp: 98.9 F (37.2 C)   Wt Readings from Last 3 Encounters:  01/01/13 220 lb (99.791 kg)  05/06/12 228 lb (103.42 kg)  07/10/11 217 lb 8 oz (98.657 kg)   Gen'l: Well nourished well developed white male in no acute distress  HEENT: Head: Normocephalic and atraumatic. Right Ear: External ear normal. EAC/TM nl. Left Ear: External ear normal.  EAC/TM nl. Nose: Nose normal. Mouth/Throat: Oropharynx is clear and moist. Dentition - native, in good repair. No buccal or palatal lesions. Posterior pharynx clear. Eyes: Conjunctivae and sclera clear. EOM intact. Pupils are equal, round, and reactive to light. Right eye exhibits no discharge. Left eye exhibits no discharge. Neck: Normal range of motion. Neck supple. No JVD present. No tracheal deviation present. No thyromegaly present.  Cardiovascular: Normal rate, regular rhythm, no gallop, no friction rub, no murmur heard.      Quiet precordium. 2+ radial and DP pulses . No carotid bruits Pulmonary/Chest:  Effort normal. No respiratory distress or increased WOB, no wheezes, no rales. No chest wall deformity or CVAT. Abdomen: Soft. Bowel sounds are normal in all quadrants. He exhibits no distension, no tenderness, no rebound or guarding, No heptosplenomegaly  Genitourinary:  deferred Musculoskeletal: Normal range of motion. He exhibits no edema and no tenderness.       Small and large joints without redness, synovial thickening or deformity. Full range of motion preserved about all small, median  and large joints.  Lymphadenopathy:    He has no cervical or supraclavicular adenopathy.  Neurological: He is alert and oriented to person, place, and time. CN II-XII intact. DTRs 2+ and symmetrical biceps, radial and patellar tendons. Cerebellar function normal with no tremor, rigidity, normal gait and station.  Skin: Skin is warm and dry. No rash noted. No erythema.  Psychiatric: He has a normal mood and affect. His behavior is normal. Thought content normal.   Recent Results (from the past 2160 hour(s))  HEPATIC FUNCTION PANEL     Status: Abnormal   Collection Time    01/01/13  3:36 PM      Result Value Range   Total Bilirubin 1.0  0.3 - 1.2 mg/dL   Bilirubin, Direct 0.2  0.0 - 0.3 mg/dL   Alkaline Phosphatase 30 (*) 39 - 117 U/L   AST 29  0 - 37 U/L   ALT 34  0 - 53 U/L   Total Protein 7.7  6.0 - 8.3 g/dL   Albumin 5.0  3.5 - 5.2 g/dL  COMPREHENSIVE METABOLIC PANEL     Status: Abnormal   Collection Time    01/01/13  3:36 PM      Result Value Range   Sodium 137  135 - 145 mEq/L   Potassium 3.8  3.5 - 5.1 mEq/L   Chloride 98  96 - 112 mEq/L   CO2 29  19 - 32 mEq/L   Glucose, Bld 88  70 - 99 mg/dL   BUN 15  6 - 23 mg/dL   Creatinine, Ser 1.1  0.4 - 1.5 mg/dL   Total Bilirubin 1.0  0.3 - 1.2 mg/dL   Alkaline Phosphatase 30 (*) 39 - 117 U/L   AST 29  0 - 37 U/L   ALT 34  0 - 53 U/L   Total Protein 7.7  6.0 - 8.3 g/dL   Albumin 5.0  3.5 - 5.2 g/dL   Calcium 9.7  8.4 - 16.1 mg/dL   GFR 09.60  >45.40 mL/min  LIPID PANEL     Status: Abnormal   Collection Time    01/01/13  3:36 PM      Result Value Range   Cholesterol 145  0 - 200 mg/dL   Comment: ATP III Classification       Desirable:  < 200 mg/dL               Borderline High:  200 - 239 mg/dL          High:  > = 981 mg/dL   Triglycerides 191.4 (*) 0.0 - 149.0 mg/dL   Comment: Normal:  <782 mg/dLBorderline High:  150 - 199 mg/dL   HDL 95.62  >13.08 mg/dL   VLDL 65.7  0.0 - 84.6 mg/dL   LDL Cholesterol 59  0 - 99 mg/dL    Total CHOL/HDL Ratio 3     Comment:                Men  Women1/2 Average Risk     3.4          3.3Average Risk          5.0          4.42X Average Risk          9.6          7.13X Average Risk          15.0          11.0                      PSA     Status: None   Collection Time    01/01/13  3:36 PM      Result Value Range   PSA 0.53  0.10 - 4.00 ng/mL         Assessment & Plan:

## 2013-01-01 NOTE — Patient Instructions (Signed)
Good to see you.  The pain in your chest is very atypical for heart related pain. The EKG in Dec '13 showed normal rhythm and no acute changes. Exam today was normal, including a normal heart rate during an episode of discomfort. This may be GI related Plan Take Ranitidine 150 mg AM and bedtime for at least a week to see if it relieves the discomfort  If no change with Ranitidine let me know (through MyChart) and I will refer your to Dr. Antoine Poche for his opinion.  General health is good. Will schedule you for colonoscopy - you will be contacted.   Routine lab today and the results will be available on MyChart.  Immunization - Tdap today.  Call me for problems otherwise we'll see you in a year.

## 2013-01-02 ENCOUNTER — Encounter: Payer: Self-pay | Admitting: Internal Medicine

## 2013-01-02 ENCOUNTER — Other Ambulatory Visit: Payer: Self-pay | Admitting: Internal Medicine

## 2013-01-02 ENCOUNTER — Telehealth: Payer: Self-pay

## 2013-01-02 MED ORDER — EZETIMIBE-SIMVASTATIN 10-20 MG PO TABS: 1.0000 | ORAL_TABLET | Freq: Every day | ORAL | Status: DC

## 2013-01-02 NOTE — Assessment & Plan Note (Signed)
Presenting with very atypical, non-exertional, left chest pain. Reviewed previous studies.  Plan Trial of PPI for possible GI related pain.  If not better will refer to Dr. Antoine Poche for risk stratification.

## 2013-01-02 NOTE — Assessment & Plan Note (Signed)
Taking and tolerating medication  Lipid panel with LDL 144, above goal of 100 or less for patient with known ASVD  Plan Increase Zocor to Vytorin 10/20

## 2013-01-02 NOTE — Assessment & Plan Note (Signed)
Interval history notable for atypical left chest pain, otherwise normal. Physical exam is normal. Lab reports are in normal range except for elevated LDL. He is referred for colonoscopy. Discussed pros and cons of prostate cancer screening (USPHCTF recommendations reviewed and ACU April '13 recommendations) and he requests evaluation at this time - PSA 0.53. Immunization - provided Tdap today.  In summary -  A nice man with atypical chest pain that if not responsive to PPI therapy will require risk stratification at the direction of Dr. Antoine Poche for cardiology. He will change his cholesterol medication to Vytorin with follow up lab in 4 weeks.

## 2013-01-02 NOTE — Assessment & Plan Note (Signed)
BP Readings from Last 3 Encounters:  01/01/13 130/86  05/06/12 128/80  07/10/11 118/88   Good control on present medication - no change in regimen

## 2013-01-02 NOTE — Telephone Encounter (Signed)
[facility] that a prior authorization is needed for Vytorin 10-20 mg tablets. Form was submitted to [employer] [place] and approved today effective 01/02/13 to 05/28/2038. [facility] at [phone].

## 2013-01-14 ENCOUNTER — Telehealth: Payer: Self-pay | Admitting: *Deleted

## 2013-01-14 NOTE — Telephone Encounter (Signed)
Stop the vytorin to see if the pain goes away and report back.

## 2013-01-14 NOTE — Telephone Encounter (Signed)
Spoke with pt advised of MDs message 

## 2013-01-14 NOTE — Telephone Encounter (Signed)
Pt called states he is having neck, back, and shoulder joint pain.  Pt states it feels like nerve pain. Further states symptoms did not occur until cholesterol medication change.  Please advise

## 2013-01-15 ENCOUNTER — Other Ambulatory Visit: Payer: Self-pay | Admitting: Internal Medicine

## 2013-01-29 ENCOUNTER — Other Ambulatory Visit: Payer: Self-pay | Admitting: Internal Medicine

## 2013-02-20 ENCOUNTER — Encounter: Payer: Self-pay | Admitting: Internal Medicine

## 2013-04-03 ENCOUNTER — Other Ambulatory Visit: Payer: Self-pay

## 2013-04-16 ENCOUNTER — Other Ambulatory Visit: Payer: Self-pay | Admitting: Internal Medicine

## 2013-06-26 ENCOUNTER — Other Ambulatory Visit: Payer: Self-pay | Admitting: Internal Medicine

## 2014-01-01 ENCOUNTER — Other Ambulatory Visit: Payer: Self-pay

## 2014-01-01 MED ORDER — EZETIMIBE-SIMVASTATIN 10-20 MG PO TABS: 1.0000 | ORAL_TABLET | Freq: Every day | ORAL | Status: DC

## 2014-01-01 MED ORDER — LOSARTAN POTASSIUM-HCTZ 100-25 MG PO TABS: ORAL_TABLET | ORAL | Status: DC

## 2014-03-13 ENCOUNTER — Other Ambulatory Visit: Payer: Self-pay

## 2014-04-09 ENCOUNTER — Telehealth: Payer: Self-pay | Admitting: Internal Medicine

## 2014-04-09 MED ORDER — DILTIAZEM HCL ER COATED BEADS 360 MG PO TB24: ORAL_TABLET | ORAL | Status: DC

## 2014-04-09 NOTE — Telephone Encounter (Signed)
Pt came by to request Rx refill for MATZIM LA 360 MG TABS. Pt is scheduled to establish care with Dr Silvio Pate at [facility] office next week but is already completely out of this medication at this time. Pt went to the [facility] office and was told to return to our office for his refill request.   [facility] [place]  Please contact pt when request is reviewed.

## 2014-04-09 NOTE — Telephone Encounter (Signed)
[facility] 

## 2014-04-17 ENCOUNTER — Encounter: Payer: Self-pay | Admitting: Internal Medicine

## 2014-04-17 ENCOUNTER — Ambulatory Visit (INDEPENDENT_AMBULATORY_CARE_PROVIDER_SITE_OTHER): Payer: BC Managed Care – PPO | Admitting: Internal Medicine

## 2014-04-17 VITALS — BP 140/80 | HR 57 | Temp 98.1°F | Ht 71.0 in | Wt 229.0 lb

## 2014-04-17 DIAGNOSIS — Z1211 Encounter for screening for malignant neoplasm of colon: Secondary | ICD-10-CM

## 2014-04-17 DIAGNOSIS — Z0000 Encounter for general adult medical examination without abnormal findings: Secondary | ICD-10-CM

## 2014-04-17 DIAGNOSIS — I10 Essential (primary) hypertension: Secondary | ICD-10-CM

## 2014-04-17 DIAGNOSIS — E785 Hyperlipidemia, unspecified: Secondary | ICD-10-CM

## 2014-04-17 DIAGNOSIS — M5020 Other cervical disc displacement, unspecified cervical region: Secondary | ICD-10-CM | POA: Insufficient documentation

## 2014-04-17 LAB — COMPREHENSIVE METABOLIC PANEL
ALBUMIN: 4.8 g/dL (ref 3.5–5.2)
ALT: 35 U/L (ref 0–53)
AST: 31 U/L (ref 0–37)
Alkaline Phosphatase: 28 U/L — ABNORMAL LOW (ref 39–117)
BILIRUBIN TOTAL: 0.7 mg/dL (ref 0.2–1.2)
BUN: 12 mg/dL (ref 6–23)
CO2: 30 meq/L (ref 19–32)
Calcium: 9.7 mg/dL (ref 8.4–10.5)
Chloride: 100 mEq/L (ref 96–112)
Creatinine, Ser: 1 mg/dL (ref 0.4–1.5)
GFR: 83.36 mL/min (ref >60.00)
GLUCOSE: 109 mg/dL — AB (ref 70–99)
POTASSIUM: 4 meq/L (ref 3.5–5.1)
SODIUM: 138 meq/L (ref 135–145)
TOTAL PROTEIN: 7.6 g/dL (ref 6.0–8.3)

## 2014-04-17 LAB — CBC WITH DIFFERENTIAL/PLATELET
BASOS ABS: 0 10*3/uL (ref 0.0–0.1)
Basophils Relative: 0.6 % (ref 0.0–3.0)
Eosinophils Absolute: 0.1 10*3/uL (ref 0.0–0.7)
Eosinophils Relative: 2 % (ref 0.0–5.0)
HEMATOCRIT: 46.9 % (ref 39.0–52.0)
Hemoglobin: 15.5 g/dL (ref 13.0–17.0)
LYMPHS ABS: 2.1 10*3/uL (ref 0.7–4.0)
Lymphocytes Relative: 35 % (ref 12.0–46.0)
MCHC: 33.1 g/dL (ref 30.0–36.0)
MCV: 97.4 fl (ref 78.0–100.0)
MONO ABS: 0.6 10*3/uL (ref 0.1–1.0)
MONOS PCT: 10 % (ref 3.0–12.0)
NEUTROS PCT: 52.4 % (ref 43.0–77.0)
Neutro Abs: 3.1 10*3/uL (ref 1.4–7.7)
PLATELETS: 221 10*3/uL (ref 150.0–400.0)
RBC: 4.81 Mil/uL (ref 4.22–5.81)
RDW: 12.3 % (ref 11.5–15.5)
WBC: 5.9 10*3/uL (ref 4.0–10.5)

## 2014-04-17 LAB — LIPID PANEL
CHOLESTEROL: 122 mg/dL (ref 0–200)
HDL: 44.7 mg/dL (ref >39.00)
LDL Cholesterol: 50 mg/dL (ref 0–99)
NONHDL: 77.3
Total CHOL/HDL Ratio: 3
Triglycerides: 135 mg/dL (ref 0.0–149.0)
VLDL: 27 mg/dL (ref 0.0–40.0)

## 2014-04-17 LAB — T4, FREE: FREE T4: 0.92 ng/dL (ref 0.60–1.60)

## 2014-04-17 NOTE — Assessment & Plan Note (Signed)
BP Readings from Last 3 Encounters:  04/17/14 140/80  01/01/13 130/86  05/06/12 128/80   Good cotnrol Due for labs

## 2014-04-17 NOTE — Assessment & Plan Note (Signed)
Non obstructive CAD on cath (may be normal variant) Will continue the statin

## 2014-04-17 NOTE — Addendum Note (Signed)
Addended by: Despina Hidden on: 04/17/2014 12:39 PM   Modules accepted: Orders

## 2014-04-17 NOTE — Progress Notes (Signed)
Pre visit review using our clinic review tool, if applicable. No additional management support is needed unless otherwise documented below in the visit note. 

## 2014-04-17 NOTE — Progress Notes (Signed)
Subjective:    Patient ID: [name] [name], male    DOB: 02-01-1961, 53 y.o.   MRN: [id number]  HPI Here for physical Had been seeing Dr Linda Hedges  Somewhat active at work--no set exercise Hasn't been exercising lately--has trained in the past No chest pain or SOB Cath in 2011--- non obstructive minimal disease  Current Outpatient Prescriptions on File Prior to Visit  Medication Sig Dispense Refill  . diltiazem (MATZIM LA) 360 MG 24 hr tablet TAKE 1 TABLET (360 MG TOTAL) BY MOUTH DAILY. 30 tablet 0  . ezetimibe-simvastatin (VYTORIN) 10-20 MG per tablet Take 1 tablet by mouth at bedtime. 30 tablet 11  . losartan-hydrochlorothiazide (HYZAAR) 100-25 MG per tablet TAKE 1 TABLET BY MOUTH EVERY DAY 30 tablet 5  . potassium chloride (KLOR-CON M10) 10 MEQ tablet Take 1 tablet by mouth twice daily. 60 tablet 5  . [DISCONTINUED] potassium chloride (K-DUR) 10 MEQ tablet Take 1 tablet (10 mEq total) by mouth 2 (two) times daily. 60 tablet 2   No current facility-administered medications on file prior to visit.    Allergies  Allergen Reactions  . Hydrocodone Nausea Only    Past Medical History  Diagnosis Date  . Hyperlipidemia   . Hypertension   . Cancer     skin, basal cell face and abdomen  . Herniated disc, cervical     Past Surgical History  Procedure Laterality Date  . Hernia repair      age 31  . Basal cell carcinoma excision    . Eye surgery      as a child  . Finger fracture surgery      5th digit, left hand, x2    Family History  Problem Relation Age of Onset  . Hypertension Mother   . Diabetes Father   . Hypertension Father   . Heart attack Father     History   Social History  . Marital Status: Married    Spouse Name: N/A    Number of Children: 1  . Years of Education: N/A   Occupational History  . 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 Smoking status: Never Smoker   . Smokeless tobacco: Former Systems developer     Comment: Dip Only.    . Alcohol Use: Yes  . Drug Use: No  . Sexual Activity: Not on file   Other Topics Concern  . 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       Regular vodka--discussed amount Wears seat belt  HENT: Negative for dental problem, hearing loss and tinnitus.        Inconsistent with dentist  Eyes:       Blind in right eye  Respiratory: Negative for cough, chest tightness and shortness of breath.   Cardiovascular: Positive for palpitations. Negative for chest pain and leg swelling.       Rare palpitations now  Gastrointestinal: Negative for nausea, vomiting, abdominal pain, constipation and blood in stool.  Endocrine: Negative for polydipsia and polyuria.  Genitourinary: Negative for urgency, frequency and difficulty urinating.       No sexual problems  Musculoskeletal: Positive for neck pain. Negative for back  pain, joint swelling and arthralgias.  Skin: Negative for rash.  Allergic/Immunologic: Negative for environmental allergies and immunocompromised state.  Neurological: Positive for weakness. Negative for dizziness, syncope, light-headedness and headaches.       Right arm symptoms from the cervical disc  Hematological: Negative for adenopathy. Does not bruise/bleed easily.  Psychiatric/Behavioral: Negative for sleep disturbance and dysphoric mood. The patient is not nervous/anxious.        Objective:   Physical Exam  Constitutional: He is oriented to person, place, and time. He appears well-developed and well-nourished. No distress.  HENT:  Head: Normocephalic and atraumatic.  Right Ear: External ear normal.  Left Ear: External ear normal.  Mouth/Throat: Oropharynx is clear  and moist. No oropharyngeal exudate.  Eyes: Conjunctivae and EOM are normal. Pupils are equal, round, and reactive to light.  Neck: Normal range of motion. Neck supple. No thyromegaly present.  Cardiovascular: Normal rate, regular rhythm, normal heart sounds and intact distal pulses.  Exam reveals no gallop.   No murmur heard. Pulmonary/Chest: Effort normal and breath sounds normal. No respiratory distress. He has no wheezes. He has no rales.  Abdominal: Soft. There is no tenderness.  Musculoskeletal: He exhibits no edema or tenderness.  Lymphadenopathy:    He has no cervical adenopathy.  Neurological: He is alert and oriented to person, place, and time.  Skin: No rash noted. No erythema.  Psychiatric: He has a normal mood and affect. His behavior is normal.          Assessment & Plan:

## 2014-04-17 NOTE — Assessment & Plan Note (Signed)
Healthy Discussed fitness Defer PSA to next year Fecal imunoassay after discussion

## 2014-04-20 ENCOUNTER — Telehealth: Payer: Self-pay | Admitting: Internal Medicine

## 2014-04-20 NOTE — Telephone Encounter (Signed)
emmi emailed °

## 2014-04-29 ENCOUNTER — Other Ambulatory Visit: Payer: Self-pay | Admitting: Internal Medicine

## 2014-05-07 ENCOUNTER — Other Ambulatory Visit: Payer: Self-pay | Admitting: Internal Medicine

## 2014-05-08 NOTE — Telephone Encounter (Signed)
rx sent to pharmacy by e-script  

## 2014-05-08 NOTE — Telephone Encounter (Signed)
Approved:  Okay for a year Make sure this is not expensive--- it is some brand name of diltiazem that I had never heard of

## 2014-06-13 ENCOUNTER — Other Ambulatory Visit: Payer: Self-pay | Admitting: Internal Medicine

## 2014-06-13 NOTE — Telephone Encounter (Signed)
Approved: okay to refill for a year 

## 2014-06-15 NOTE — Telephone Encounter (Signed)
rx sent to pharmacy by e-script  

## 2014-06-27 ENCOUNTER — Other Ambulatory Visit: Payer: Self-pay | Admitting: Internal Medicine

## 2014-07-02 ENCOUNTER — Other Ambulatory Visit: Payer: Self-pay

## 2014-07-02 MED ORDER — LOSARTAN POTASSIUM-HCTZ 100-25 MG PO TABS: 1.0000 | ORAL_TABLET | Freq: Every day | ORAL | Status: DC

## 2014-07-02 NOTE — Telephone Encounter (Signed)
[facility] said [facility] not have refills on losartan HCTZ. Spoke with [facility]ist and [facility] sent on 06/15/14 for # 90 x 3; pharmacist requested me to resend; done. Pt notified and voiced understanding.

## 2014-10-17 ENCOUNTER — Other Ambulatory Visit: Payer: Self-pay | Admitting: Internal Medicine

## 2015-03-02 ENCOUNTER — Other Ambulatory Visit: Payer: Self-pay | Admitting: Internal Medicine

## 2015-03-04 ENCOUNTER — Other Ambulatory Visit: Payer: Self-pay | Admitting: Internal Medicine

## 2015-04-24 ENCOUNTER — Other Ambulatory Visit: Payer: Self-pay | Admitting: Internal Medicine

## 2015-04-26 ENCOUNTER — Encounter: Payer: Self-pay | Admitting: Internal Medicine

## 2015-04-26 ENCOUNTER — Ambulatory Visit (INDEPENDENT_AMBULATORY_CARE_PROVIDER_SITE_OTHER): Payer: BLUE CROSS/BLUE SHIELD | Admitting: Internal Medicine

## 2015-04-26 VITALS — BP 126/80 | HR 55 | Temp 97.2°F | Ht 71.0 in | Wt 221.0 lb

## 2015-04-26 DIAGNOSIS — I10 Essential (primary) hypertension: Secondary | ICD-10-CM | POA: Diagnosis not present

## 2015-04-26 DIAGNOSIS — Z23 Encounter for immunization: Secondary | ICD-10-CM

## 2015-04-26 DIAGNOSIS — Z125 Encounter for screening for malignant neoplasm of prostate: Secondary | ICD-10-CM | POA: Diagnosis not present

## 2015-04-26 DIAGNOSIS — Z1211 Encounter for screening for malignant neoplasm of colon: Secondary | ICD-10-CM

## 2015-04-26 DIAGNOSIS — Z0000 Encounter for general adult medical examination without abnormal findings: Secondary | ICD-10-CM | POA: Diagnosis not present

## 2015-04-26 DIAGNOSIS — E785 Hyperlipidemia, unspecified: Secondary | ICD-10-CM

## 2015-04-26 NOTE — Assessment & Plan Note (Signed)
Okay with primary prevention If really low again--could consider changing to plain simvastatin

## 2015-04-26 NOTE — Assessment & Plan Note (Signed)
BP Readings from Last 3 Encounters:  04/26/15 126/80  04/17/14 140/80  01/01/13 130/86   Good control No change needed

## 2015-04-26 NOTE — Assessment & Plan Note (Signed)
Healthy Will check PSA after discussion Fecal immunoassay Flu vaccine today

## 2015-04-26 NOTE — Progress Notes (Signed)
Subjective:    Patient ID: [name] [name], male    DOB: 07-17-1960, 54 y.o.   MRN: [id number]  HPI Here for physical  No new concerns Has various shoulder/knee pains. Usually doesn't take meds Active at work--moving materials at least Does a lot of walking also  Had some various chest pains a month ago or so Lasted 2 weeks or so Come and go after about 1 minute Usually at rest Points to pectorals on left Current Outpatient Prescriptions on File Prior to Visit  Medication Sig Dispense Refill  . KLOR-CON M10 10 MEQ tablet TAKE 1 TABLET BY MOUTH TWICE DAILY. 60 tablet 5  . losartan-hydrochlorothiazide (HYZAAR) 100-25 MG per tablet Take 1 tablet by mouth daily. 90 tablet 3  . MATZIM LA 360 MG 24 hr tablet TAKE 1 TABLET (360 MG TOTAL) BY MOUTH DAILY. 90 tablet 3  . VYTORIN 10-20 MG tablet TAKE 1 TABLET BY MOUTH AT BEDTIME. 30 tablet 1  . [DISCONTINUED] potassium chloride (K-DUR) 10 MEQ tablet Take 1 tablet (10 mEq total) by mouth 2 (two) times daily. 60 tablet 2   No current facility-administered medications on file prior to visit.    Allergies  Allergen Reactions  . Hydrocodone Nausea Only    Past Medical History  Diagnosis Date  . Hyperlipidemia   . Hypertension   . Cancer ([place])     skin, basal cell face and abdomen  . Herniated disc, cervical     Past Surgical History  Procedure Laterality Date  . Hernia repair      age 66  . Basal cell carcinoma excision    . Eye surgery      as a child  . Finger fracture surgery      5th digit, left hand, x2    Family History  Problem Relation Age of Onset  . Hypertension Mother   . Diabetes Father   . Hypertension Father   . Heart attack Father     Social History   Social History  . Marital Status: Married    Spouse Name: N/A  . Number of Children: 1  . Years of Education: N/A   Occupational History  . 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 Smoking status: Never Smoker   .  Smokeless tobacco: Former Systems developer     Comment: Dip Only.  . Alcohol Use: Yes  . Drug Use: No  . Sexual Activity: Not on file   Other Topics Concern  . 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       Weight down 8# Wears seat belt  HENT: Negative for dental problem, hearing loss and tinnitus.        Overdue for dentist  Eyes: Negative for visual disturbance.       Reading glasses No diplopia or unilateral vision loss  Respiratory: Negative for cough, chest tightness and shortness of breath.   Cardiovascular: Positive for chest pain. Negative for leg swelling.       Palpitations with maximal exertion--like walking uphill carrying a piece of sheetrock  Gastrointestinal: Positive for constipation. Negative for nausea, vomiting, abdominal pain and blood in stool.  No heartburn No meds for bowels  Endocrine: Negative for polydipsia and polyuria.  Genitourinary: Negative for urgency and difficulty urinating.       No sexual problems  Musculoskeletal: Positive for arthralgias. Negative for back pain and joint swelling.  Skin: Negative for rash.       No suspicious lesions  Allergic/Immunologic: Negative for environmental allergies and immunocompromised state.  Neurological: Negative for dizziness, syncope, weakness, light-headedness and headaches.  Hematological: Negative for adenopathy. Does not bruise/bleed easily.  Psychiatric/Behavioral: Negative for sleep disturbance and dysphoric mood. The patient is not nervous/anxious.        Objective:   Physical Exam  Constitutional: He is oriented to person, place, and time. He appears well-developed and well-nourished. No distress.  HENT:  Head: Normocephalic and  atraumatic.  Right Ear: External ear normal.  Left Ear: External ear normal.  Mouth/Throat: Oropharynx is clear and moist. No oropharyngeal exudate.  Eyes: Conjunctivae are normal. Pupils are equal, round, and reactive to light.  Neck: Normal range of motion. Neck supple. No thyromegaly present.  Cardiovascular: Normal rate, regular rhythm, normal heart sounds and intact distal pulses.  Exam reveals no gallop.   No murmur heard. Pulmonary/Chest: Effort normal and breath sounds normal. No respiratory distress. He has no wheezes. He has no rales.  Abdominal: Soft. There is no tenderness.  Musculoskeletal: He exhibits no edema or tenderness.  Lymphadenopathy:    He has no cervical adenopathy.  Neurological: He is alert and oriented to person, place, and time.  Skin: No rash noted. No erythema.  Multiple benign small nevi--discussed sun protection, etc  Psychiatric: He has a normal mood and affect. His behavior is normal.          Assessment & Plan:

## 2015-04-26 NOTE — Addendum Note (Signed)
Addended by: Marchia Bond on: 04/26/2015 04:24 PM   Modules accepted: Miquel Dunn

## 2015-04-26 NOTE — Addendum Note (Signed)
Addended by: Cloyd Stagers B on: 04/26/2015 04:21 PM   Modules accepted: Orders, SmartSet

## 2015-04-27 LAB — COMPREHENSIVE METABOLIC PANEL
ALBUMIN: 5 g/dL (ref 3.5–5.2)
ALK PHOS: 31 U/L — AB (ref 39–117)
ALT: 26 U/L (ref 0–53)
AST: 26 U/L (ref 0–37)
BUN: 12 mg/dL (ref 6–23)
CALCIUM: 9.9 mg/dL (ref 8.4–10.5)
CHLORIDE: 100 meq/L (ref 96–112)
CO2: 31 meq/L (ref 19–32)
Creatinine, Ser: 1.02 mg/dL (ref 0.40–1.50)
GFR: 80.24 mL/min (ref >60.00)
GLUCOSE: 83 mg/dL (ref 70–99)
POTASSIUM: 3.9 meq/L (ref 3.5–5.1)
SODIUM: 141 meq/L (ref 135–145)
Total Bilirubin: 0.6 mg/dL (ref 0.2–1.2)
Total Protein: 7.5 g/dL (ref 6.0–8.3)

## 2015-04-27 LAB — CBC WITH DIFFERENTIAL/PLATELET
BASOS ABS: 0 10*3/uL (ref 0.0–0.1)
Basophils Relative: 0.5 % (ref 0.0–3.0)
EOS ABS: 0.1 10*3/uL (ref 0.0–0.7)
Eosinophils Relative: 1.3 % (ref 0.0–5.0)
HEMATOCRIT: 45.7 % (ref 39.0–52.0)
HEMOGLOBIN: 15.4 g/dL (ref 13.0–17.0)
LYMPHS PCT: 32.4 % (ref 12.0–46.0)
Lymphs Abs: 2.1 10*3/uL (ref 0.7–4.0)
MCHC: 33.8 g/dL (ref 30.0–36.0)
MCV: 96.8 fl (ref 78.0–100.0)
Monocytes Absolute: 0.6 10*3/uL (ref 0.1–1.0)
Monocytes Relative: 10 % (ref 3.0–12.0)
Neutro Abs: 3.6 10*3/uL (ref 1.4–7.7)
Neutrophils Relative %: 55.8 % (ref 43.0–77.0)
Platelets: 182 10*3/uL (ref 150.0–400.0)
RBC: 4.72 Mil/uL (ref 4.22–5.81)
RDW: 12.6 % (ref 11.5–15.5)
WBC: 6.5 10*3/uL (ref 4.0–10.5)

## 2015-04-27 LAB — LIPID PANEL
CHOL/HDL RATIO: 2
Cholesterol: 129 mg/dL (ref 0–200)
HDL: 54.2 mg/dL (ref >39.00)
LDL CALC: 41 mg/dL (ref 0–99)
NONHDL: 74.35
Triglycerides: 167 mg/dL — ABNORMAL HIGH (ref 0.0–149.0)
VLDL: 33.4 mg/dL (ref 0.0–40.0)

## 2015-04-27 LAB — PSA: PSA: 0.6 ng/mL (ref 0.10–4.00)

## 2015-04-30 ENCOUNTER — Other Ambulatory Visit: Payer: Self-pay | Admitting: Internal Medicine

## 2015-06-21 ENCOUNTER — Ambulatory Visit (INDEPENDENT_AMBULATORY_CARE_PROVIDER_SITE_OTHER): Payer: BLUE CROSS/BLUE SHIELD | Admitting: Internal Medicine

## 2015-06-21 ENCOUNTER — Encounter: Payer: Self-pay | Admitting: Internal Medicine

## 2015-06-21 VITALS — BP 120/70 | HR 77 | Temp 97.7°F | Wt 225.0 lb

## 2015-06-21 DIAGNOSIS — R251 Tremor, unspecified: Secondary | ICD-10-CM

## 2015-06-21 HISTORY — DX: Tremor, unspecified: R25.1

## 2015-06-21 NOTE — Progress Notes (Signed)
   Subjective:    Patient ID: [name] [name], male    DOB: 26-Feb-1961, 55 y.o.   MRN: [id number]  HPI Here due to right arm shaking Thinks it goes back a year--but was relating it to his cervical disk problems Daughter noted it is more noticeable  Occurs with action and at rest If can concentrate--he can overcome it somewhat----like with writing No clear changes in his handwriting ---but he isn't sure No change in walking Doesn't feel stiff He doesn't remember any family history of tremor  Current Outpatient Prescriptions on File Prior to Visit  Medication Sig Dispense Refill  . KLOR-CON M10 10 MEQ tablet TAKE 1 TABLET BY MOUTH TWICE DAILY. 60 tablet 5  . losartan-hydrochlorothiazide (HYZAAR) 100-25 MG per tablet Take 1 tablet by mouth daily. 90 tablet 3  . MATZIM LA 360 MG 24 hr tablet TAKE 1 TABLET BY MOUTH DAILY. 90 tablet 3  . VYTORIN 10-20 MG tablet TAKE 1 TABLET BY MOUTH AT BEDTIME. 90 tablet 3  . [DISCONTINUED] potassium chloride (K-DUR) 10 MEQ tablet Take 1 tablet (10 mEq total) by mouth 2 (two) times daily. 60 tablet 2   No current facility-administered medications on file prior to visit.    Allergies  Allergen Reactions  . Hydrocodone Nausea Only    Past Medical History  Diagnosis Date  . Hyperlipidemia   . Hypertension   . Cancer ([place])     skin, basal cell face and abdomen  . Herniated disc, cervical     Past Surgical History  Procedure Laterality Date  . Hernia repair      age 2  . Basal cell carcinoma excision    . Eye surgery      as a child  . Finger fracture surgery      5th digit, left hand, x2    Family History  Problem Relation Age of Onset  . Hypertension Mother   . Diabetes Father   . Hypertension Father   . Heart attack Father     Social History   Social History  . Marital Status: Married    Spouse Name: N/A  . Number of Children: 1  . 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 Smoking status: Never Smoker   . Smokeless tobacco: Former Systems developer     Comment: Dip Only.  . Alcohol Use: Yes  . Drug Use: No  . Sexual Activity: Not on file   Other Topics Concern  . 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

## 2015-06-21 NOTE — Progress Notes (Signed)
Pre visit review using our clinic review tool, if applicable. No additional management support is needed unless otherwise documented below in the visit note. 

## 2015-06-21 NOTE — Assessment & Plan Note (Signed)
And some in left with intention ??slight bradykinesia  Will set up evaluation with Dr Carles Collet

## 2015-06-24 ENCOUNTER — Other Ambulatory Visit (INDEPENDENT_AMBULATORY_CARE_PROVIDER_SITE_OTHER): Payer: BLUE CROSS/BLUE SHIELD

## 2015-06-24 ENCOUNTER — Ambulatory Visit (INDEPENDENT_AMBULATORY_CARE_PROVIDER_SITE_OTHER): Payer: BLUE CROSS/BLUE SHIELD | Admitting: Neurology

## 2015-06-24 ENCOUNTER — Encounter: Payer: Self-pay | Admitting: Neurology

## 2015-06-24 VITALS — BP 130/70 | HR 54 | Ht 71.0 in | Wt 224.0 lb

## 2015-06-24 DIAGNOSIS — G950 Syringomyelia and syringobulbia: Secondary | ICD-10-CM

## 2015-06-24 DIAGNOSIS — G20 Parkinson's disease: Secondary | ICD-10-CM

## 2015-06-24 DIAGNOSIS — M4802 Spinal stenosis, cervical region: Secondary | ICD-10-CM

## 2015-06-24 DIAGNOSIS — F1099 Alcohol use, unspecified with unspecified alcohol-induced disorder: Secondary | ICD-10-CM

## 2015-06-24 DIAGNOSIS — IMO0002 Reserved for concepts with insufficient information to code with codable children: Secondary | ICD-10-CM

## 2015-06-24 LAB — TSH: TSH: 1.51 u[IU]/mL (ref 0.35–4.50)

## 2015-06-24 NOTE — Patient Instructions (Signed)
1. We have scheduled you at [facility] for your OPEN MRI's on 06/30/15 at 10:00 am. Please arrive 30 minutes prior and go to [place]. If you need to reschedule for any reason please call [phone]. 2. Your provider has requested that you have labwork completed today. Please go to [facility] ([id number]) on the second floor of this building before leaving the office today. You do not need to check in. If you are not called within 15 minutes please check with the front desk.

## 2015-06-24 NOTE — Progress Notes (Signed)
[name] [name] was seen today in the movement disorders clinic for neurologic consultation at the request of Viviana Simpler, MD.  The consultation is for the evaluation of R hand tremor x 1 year.  The records that were made available to me were reviewed.  Pt also brought in records/films from [facility].  Pt states that he initially thought that tremor was coming from a problem in his cervical spine.  He was having neck and R arm pain and went to [facility].  I did review an MRI of his cervical spine that he brought me, dated 02/13/2013.  There was a moderate to large disc protrusion at the C6-C7 level which compressed the right C7 nerve root.  There was no associated spinal cord signal change at that level.  There was evidence of a syringomyelia but no cord expansion.  It was recommended that the patient undergo a repeat contrasted examination as well as an MRI of the thoracic spine.  I do not see that this was completed, at least on the images that he brought me and the images that are available through Epic.  He reports that it was not done.  He had injections and the pain got better for about 6 months but came back but he didn't follow back up.    Specific Symptoms:  Tremor: Yes.   (R hand, started with just fingers "twitching" but now more the whole hand, more at rest; never right leg; never L side; no family hx of tremor; 1-2 mugs coffee/day and 2 unsweet tea per day and doesn't think affects tremor; drinks 5 vodka per day and doesn't think that changes tremor) Voice: doesn't know if has changed Sleep: sleeping well  Vivid Dreams:  Yes.  , but has his "whole life"  Acting out dreams:  Yes.   (occasionally hits pillow but not often) Wet Pillows: No. Postural symptoms:  No.  Falls?  No. Bradykinesia symptoms: slow movements Loss of smell:  No. Loss of taste:  No. Urinary Incontinence:  No. Difficulty Swallowing:  No. Handwriting, micrographia: Yes.   (R hand dominant) Trouble with ADL's:   No.  Trouble buttoning clothing: No. Depression:  No. Memory changes:  Yes.   (short term memory) Hallucinations:  No.  visual distortions: Yes.   N/V:  No. Lightheaded:  No.  Syncope: No. Diplopia:  No. Dyskinesia:  No.  Neuroimaging of the brain has previously been performed.  It was apparently done years ago for headache.   ALLERGIES:   Allergies  Allergen Reactions  . Hydrocodone Nausea Only    CURRENT MEDICATIONS:  Outpatient Encounter Prescriptions as of 06/24/2015  Medication Sig  . KLOR-CON M10 10 MEQ tablet TAKE 1 TABLET BY MOUTH TWICE DAILY.  Marland Kitchen losartan-hydrochlorothiazide (HYZAAR) 100-25 MG per tablet Take 1 tablet by mouth daily.  Marland Kitchen MATZIM LA 360 MG 24 hr tablet TAKE 1 TABLET BY MOUTH DAILY.  . VYTORIN 10-20 MG tablet TAKE 1 TABLET BY MOUTH AT BEDTIME.   No facility-administered encounter medications on file as of 06/24/2015.    PAST MEDICAL HISTORY:   Past Medical History  Diagnosis Date  . Hyperlipidemia   . Hypertension   . Cancer ([place])     skin, basal cell face and abdomen  . Herniated disc, cervical     PAST SURGICAL HISTORY:   Past Surgical History  Procedure Laterality Date  . Hernia repair      age 29  . Basal cell carcinoma excision    .  Eye surgery      as a child  . Finger fracture surgery      5th digit, left hand, x2    SOCIAL HISTORY:   Social History   Social History  . Marital Status: Married    Spouse Name: N/A  . Number of Children: 1  . Years of Education: N/A   Occupational History  . 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 Smoking status: Never Smoker   . Smokeless tobacco: Former Systems developer     Comment: Dip Only.  . Alcohol Use: 0.0 oz/week    0 Standard drinks or equivalent per week     Comment: 3-4 drinks a night  . Drug Use: No  . Sexual Activity: Not on file   Other Topics Concern  . 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 Father Deceased 83    MI, DM  . Mother Alive     HTN  . Brother Deceased     liver problems  . Brother Alive     DM  . Sister Alive     HTN    ROS:  A complete 10 system review of systems was obtained and was unremarkable apart from what is mentioned above.  PHYSICAL EXAMINATION:    VITALS:   Filed Vitals:   06/24/15 0846  BP: 130/70  Pulse: 54  Height: 5\' 11"  (1.803 m)  Weight: 224 lb (101.606 kg)    GEN:  The patient appears stated age and is in NAD. HEENT:  Normocephalic, atraumatic.  The mucous membranes are moist. The superficial temporal arteries are without ropiness or tenderness. CV:  RRR Lungs:  CTAB Neck/HEME:  There are no carotid bruits bilaterally.  Neurological examination:  Orientation: The patient is alert and oriented x3. Fund of knowledge is appropriate.  Recent and remote memory are intact.  Attention and concentration are normal.    Able to name objects and repeat phrases. Cranial nerves: There is good facial symmetry. There is facial hypomimia.  Pupils are equal round and reactive to light bilaterally. Fundoscopic exam reveals clear margins bilaterally. Extraocular muscles are intact. The visual fields are full to confrontational testing. The speech is fluent and clear. Minimal trouble with gutteral sounds.  Soft palate rises symmetrically and there is no tongue deviation. Hearing is intact to conversational tone. Sensation: Sensation is intact to light and pinprick throughout (facial, trunk, extremities). Vibration is intact at the bilateral big toe. There is no extinction with double simultaneous stimulation. There is no sensory dermatomal level identified. Motor: Strength is 5/5 in the bilateral upper and lower extremities.    Shoulder shrug is equal and symmetric.  There is no pronator drift. Deep tendon reflexes: Deep tendon reflexes are 2/4 at the bilateral biceps, triceps, brachioradialis, patella and achilles. Plantar responses are downgoing bilaterally.  Movement examination: Tone: There is mild rigidity with activation only in the RUE.  Tone elsewhere is normal.   Abnormal movements: There was mild RUE resting tremor.  There is jaw tremor.  Mild postural tremor Coordination:  There is decremation with RAM's, only with alternation of supination/pronation of the forearm on the right. Gait  and Station: The patient has no difficulty arising out of a deep-seated chair without the use of the hands. The patient's stride length is normal.  The patient has a negative pull test.      Lab Results  Component Value Date   TSH 2.12 05/27/2009     Chemistry      Component Value Date/Time   NA 141 04/26/2015 1627   K 3.9 04/26/2015 1627   CL 100 04/26/2015 1627   CO2 31 04/26/2015 1627   BUN 12 04/26/2015 1627   CREATININE 1.02 04/26/2015 1627      Component Value Date/Time   CALCIUM 9.9 04/26/2015 1627   ALKPHOS 31* 04/26/2015 1627   AST 26 04/26/2015 1627   ALT 26 04/26/2015 1627   BILITOT 0.6 04/26/2015 1627       ASSESSMENT/PLAN:  1.  Parkinsonism.  I suspect that this does represent idiopathic Parkinson's disease.  The patient has tremor, bradykinesia, mild rigidity.  -We discussed the diagnosis as well as pathophysiology of the disease.  We discussed treatment options as well as prognostic indicators.  Patient education was provided.  -Greater than 50% of the 80 minute visit was spent in counseling answering questions and talking about what to expect now as well as in the future.  We talked about medication options as well as potential future surgical options.  We talked about safety especially at work.  He does home improvement, and climbs ladders. I have no objection to that currently as he is steady  but we did talk about work safety and future options.  -We talked about meds but he doesn't want them right now  -We discussed community resources in the area including patient support groups and community exercise programs for PD and pt education was provided to the patient.  -talked to him about extreme importance of safe, CV exercise  -MRI brain 2.  EtOH overuse  -talked to him about importance of weaning and d/c all together.  May contribute to some of his postural tremor but doesn't explain other sx's and discussed this with him today 3.  Cervical spinal stenosis with syringomyelia  -Needs further investigation.  MRI last done in 2014 and it was recommended then to do contrasted scan and do MRI thoracic scan to rule out structural lesion.  This was not followed up on at the time and we will need to look at this.   5.  F/u 4-5 weeks.  Encouraged to bring wife as wouldn't let her or his daughter come today.

## 2015-06-28 ENCOUNTER — Telehealth: Payer: Self-pay | Admitting: Neurology

## 2015-06-28 NOTE — Telephone Encounter (Signed)
-----   Message from Milta Deiters sent at 06/28/2015 12:02 PM EST ----- Regarding: [place] [phone]) called wanting a auth on this pat.  The test is Feb. 1 [place]

## 2015-06-28 NOTE — Telephone Encounter (Signed)
Made aware auth already obtained: [id number].

## 2015-06-29 ENCOUNTER — Other Ambulatory Visit: Payer: Self-pay | Admitting: Internal Medicine

## 2015-06-30 ENCOUNTER — Telehealth: Payer: Self-pay | Admitting: Neurology

## 2015-06-30 NOTE — Telephone Encounter (Signed)
PT called and said he made it through one MRI but could not make it through the other two and wanted to know if he could get some medication prescribed to finish the tests/Dawn   CB# [phone]

## 2015-07-01 MED ORDER — DIAZEPAM 5 MG PO TABS: ORAL_TABLET | ORAL | Status: DC

## 2015-07-01 NOTE — Telephone Encounter (Signed)
Please let pt know that MRI brain looks good (2 T2 hyperintensities on review).  As for the question on medication, I thought that he had valium.  Cannot give anything stronger than this.  Find out how he took it before the test.

## 2015-07-01 NOTE — Telephone Encounter (Signed)
Patient made aware MR brain okay. He was scheduled in the open machine before but did not take valium. Valium called in for patient to take one tablet 30 minutes prior to MR (on arrival to facility) and one more just prior to MR (if needed). He has already rescheduled his spinal MR's and we will call with results when done.

## 2015-07-29 ENCOUNTER — Ambulatory Visit (INDEPENDENT_AMBULATORY_CARE_PROVIDER_SITE_OTHER): Payer: BLUE CROSS/BLUE SHIELD | Admitting: Neurology

## 2015-07-29 ENCOUNTER — Encounter: Payer: Self-pay | Admitting: Neurology

## 2015-07-29 VITALS — BP 118/78 | HR 66 | Ht 71.0 in | Wt 225.0 lb

## 2015-07-29 DIAGNOSIS — G950 Syringomyelia and syringobulbia: Secondary | ICD-10-CM

## 2015-07-29 DIAGNOSIS — M4802 Spinal stenosis, cervical region: Secondary | ICD-10-CM

## 2015-07-29 DIAGNOSIS — G20 Parkinson's disease: Secondary | ICD-10-CM | POA: Diagnosis not present

## 2015-07-29 MED ORDER — PRAMIPEXOLE DIHYDROCHLORIDE ER 0.375 MG PO TB24: ORAL_TABLET | ORAL | Status: DC

## 2015-07-29 MED ORDER — PRAMIPEXOLE DIHYDROCHLORIDE ER 1.5 MG PO TB24: 1.0000 | ORAL_TABLET | Freq: Every day | ORAL | Status: DC

## 2015-07-29 NOTE — Patient Instructions (Signed)
1. Start Mirapex as follows:  You will take 1 tablet daily for one week of Mirapex 0.375 mg. You will then take 2 tablets  daily for one week. You will then switch to the Mirapex 1.5 mg tablets. You will take this 1 tablet once daily.  2. Reschedule your MRI Cervical and Thoracic Spine.  3. Follow up 3 months.

## 2015-07-29 NOTE — Progress Notes (Signed)
[name] [name] was seen today in the movement disorders clinic for neurologic consultation at the request of Viviana Simpler, MD.  The consultation is for the evaluation of R hand tremor x 1 year.  The records that were made available to me were reviewed.  Pt also brought in records/films from [facility].  Pt states that he initially thought that tremor was coming from a problem in his cervical spine.  He was having neck and R arm pain and went to [facility].  I did review an MRI of his cervical spine that he brought me, dated 02/13/2013.  There was a moderate to large disc protrusion at the C6-C7 level which compressed the right C7 nerve root.  There was no associated spinal cord signal change at that level.  There was evidence of a syringomyelia but no cord expansion.  It was recommended that the patient undergo a repeat contrasted examination as well as an MRI of the thoracic spine.  I do not see that this was completed, at least on the images that he brought me and the images that are available through Epic.  He reports that it was not done.  He had injections and the pain got better for about 6 months but came back but he didn't follow back up.    07/29/15 update: The patient follows up today regarding his new diagnosis of Parkinson's disease, accompanied by his wife who supplements the history.  He did not want to start on any new medications last visit.  He has been about the same except that he has noted some occasional tremor in the left upper extremity which is new.  No falls.  No hallucinations.  No lightheadedness or near syncope.  He did have an MRI of the brain without gadolinium on 06/30/2015.  I reviewed this and it was negative.  He was supposed to have an MRI of the cervical and thoracic spine.  This was primarily supposed to be a follow-up from 2014, at which point he had cervical spinal stenosis with syringomyelia and it was recommended at that time that he have a thoracic scan to rule out a  structural lesion.  He did not have it done then, so I ordered at last visit, but he he initially needed sedation.  I prescribed him Valium, but he did not go back and have the scan done.    Neuroimaging of the brain has previously been performed.  It was apparently done years ago for headache.   ALLERGIES:   Allergies  Allergen Reactions  . Hydrocodone Nausea Only    CURRENT MEDICATIONS:  Outpatient Encounter Prescriptions as of 07/29/2015  Medication Sig  . KLOR-CON M10 10 MEQ tablet TAKE 1 TABLET BY MOUTH TWICE DAILY.  Marland Kitchen losartan-hydrochlorothiazide (HYZAAR) 100-25 MG tablet TAKE 1 TABLET BY MOUTH DAILY.  Marland Kitchen MATZIM LA 360 MG 24 hr tablet TAKE 1 TABLET BY MOUTH DAILY.  . VYTORIN 10-20 MG tablet TAKE 1 TABLET BY MOUTH AT BEDTIME.  . [DISCONTINUED] diazepam (VALIUM) 5 MG tablet Take one tablet 30 minutes prior to MR (on arrival to facility) and one more just prior to MR (if needed)   No facility-administered encounter medications on file as of 07/29/2015.    PAST MEDICAL HISTORY:   Past Medical History  Diagnosis Date  . Hyperlipidemia   . Hypertension   . Cancer ([place])     skin, basal cell face and abdomen  . Herniated disc, cervical     PAST  SURGICAL HISTORY:   Past Surgical History  Procedure Laterality Date  . Hernia repair      age 9  . Basal cell carcinoma excision    . Eye surgery      as a child  . Finger fracture surgery      5th digit, left hand, x2    SOCIAL HISTORY:   Social History   Social History  . Marital Status: Married    Spouse Name: N/A  . Number of Children: 1  . Years of Education: N/A   Occupational History  . 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 Smoking status: Never Smoker   . Smokeless tobacco: Former Systems developer     Comment: Dip Only.  . Alcohol Use: 0.0 oz/week    0 Standard drinks or equivalent per week     Comment: 3-4 drinks a night  . Drug Use: No  . Sexual Activity: Not on file   Other Topics Concern   . 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 Father Deceased 80    MI, DM  . Mother Alive     HTN  . Brother Deceased     liver problems  . Brother Alive     DM  . Sister Alive     HTN    ROS:  A complete 10 system review of systems was obtained and was unremarkable apart from what is mentioned above.  PHYSICAL EXAMINATION:    VITALS:   Filed Vitals:   07/29/15 1450  BP: 118/78  Pulse: 66  Height: 5\' 11"  (1.803 m)  Weight: 225 lb (102.059 kg)    GEN:  The patient appears stated age and is in NAD. HEENT:  Normocephalic, atraumatic.  The mucous membranes are moist. The superficial temporal arteries are without ropiness or tenderness. CV:  RRR With ectopic beats Lungs:  CTAB Neck/HEME:  There are no carotid bruits bilaterally.  Neurological examination:  Orientation: The patient is alert and oriented x3. Fund of knowledge is appropriate.  Recent and remote memory are intact.  Attention and concentration are normal.    Able to name objects and repeat phrases. Cranial nerves: There is good facial symmetry. There is facial hypomimia.   The speech is fluent and clear. Minimal trouble with gutteral sounds.  Soft palate rises symmetrically and there is no tongue deviation. Hearing is intact to conversational tone. Sensation: Sensation is intact to light touch throughout. Motor: Strength is 5/5 in the bilateral upper and lower extremities.   Shoulder shrug is equal and symmetric.  There is no pronator drift.   Movement examination: Tone: There is mild rigidity with activation only in the RUE.  Tone elsewhere is normal.   Abnormal movements: There was mild RUE resting tremor.  There is jaw tremor.   Mild postural tremor Coordination:  There is decremation with RAM's, only with alternation of supination/pronation of the forearm on the right. Gait and Station: The patient has no difficulty arising out of a deep-seated chair without the use of the hands. The patient's stride length is normal.  The patient has a negative pull test.      Lab  Results  Component Value Date   TSH 1.51 06/24/2015     Chemistry      Component Value Date/Time   NA 141 04/26/2015 1627   K 3.9 04/26/2015 1627   CL 100 04/26/2015 1627   CO2 31 04/26/2015 1627   BUN 12 04/26/2015 1627   CREATININE 1.02 04/26/2015 1627      Component Value Date/Time   CALCIUM 9.9 04/26/2015 1627   ALKPHOS 31* 04/26/2015 1627   AST 26 04/26/2015 1627   ALT 26 04/26/2015 1627   BILITOT 0.6 04/26/2015 1627       ASSESSMENT/PLAN:  1.  Parkinsonism.  I suspect that this does represent idiopathic Parkinson's disease.  The patient has tremor, bradykinesia, mild rigidity.  -We discussed the diagnosis as well as pathophysiology of the disease.  We discussed treatment options as well as prognostic indicators.  Patient education was provided.I reviewed much of what we talked about last visit, since his wife was not here last visit.  We talked about medication and future surgical options.  -After discussing risks, benefits, and side effects, including the risks of compulsive behaviors and sleep topics, we decided to start on pramipexole ER and work up to 1.5 mg daily.  Risks, benefits, side effects and alternative therapies were discussed.  The opportunity to ask questions was given and they were answered to the best of my ability.  The patient expressed understanding and willingness to follow the outlined treatment protocols.  -We again discussed community resources in the area including patient support groups and community exercise programs for PD and pt education was provided to the patient.  -talked to him about extreme importance of  safe, CV exercise 2.  EtOH overuse  -talked to him about importance of weaning and d/c all together.  May contribute to some of his postural tremor but doesn't explain other sx's  3.  Cervical spinal stenosis with syringomyelia  -Needs further investigation.  MRI last done in 2014 and it was recommended then to do contrasted scan and do MRI thoracic scan to rule out structural lesion.  This was not followed up on at the time and we will need to look at this.  Ordered last time and he didn't follow through.  Pt/wife assure me that he will get this done.   5.  F/u 3 months, sooner should new neuro issues arise.  Much greater than 50% of this visit was spent in counseling with the patient and the family.  Total face to face time:  40 min

## 2015-10-01 ENCOUNTER — Other Ambulatory Visit: Payer: Self-pay | Admitting: Internal Medicine

## 2015-11-09 ENCOUNTER — Ambulatory Visit (INDEPENDENT_AMBULATORY_CARE_PROVIDER_SITE_OTHER): Payer: BLUE CROSS/BLUE SHIELD | Admitting: Neurology

## 2015-11-09 ENCOUNTER — Other Ambulatory Visit (INDEPENDENT_AMBULATORY_CARE_PROVIDER_SITE_OTHER): Payer: BLUE CROSS/BLUE SHIELD

## 2015-11-09 ENCOUNTER — Encounter: Payer: Self-pay | Admitting: Neurology

## 2015-11-09 VITALS — BP 110/68 | HR 62 | Ht 71.0 in | Wt 223.0 lb

## 2015-11-09 DIAGNOSIS — M791 Myalgia: Secondary | ICD-10-CM | POA: Diagnosis not present

## 2015-11-09 DIAGNOSIS — Z79899 Other long term (current) drug therapy: Secondary | ICD-10-CM

## 2015-11-09 DIAGNOSIS — F1099 Alcohol use, unspecified with unspecified alcohol-induced disorder: Secondary | ICD-10-CM | POA: Diagnosis not present

## 2015-11-09 DIAGNOSIS — IMO0002 Reserved for concepts with insufficient information to code with codable children: Secondary | ICD-10-CM

## 2015-11-09 DIAGNOSIS — G20 Parkinson's disease: Secondary | ICD-10-CM

## 2015-11-09 DIAGNOSIS — M609 Myositis, unspecified: Secondary | ICD-10-CM

## 2015-11-09 DIAGNOSIS — IMO0001 Reserved for inherently not codable concepts without codable children: Secondary | ICD-10-CM

## 2015-11-09 LAB — COMPREHENSIVE METABOLIC PANEL
ALT: 27 U/L (ref 0–53)
AST: 24 U/L (ref 0–37)
Albumin: 4.8 g/dL (ref 3.5–5.2)
Alkaline Phosphatase: 27 U/L — ABNORMAL LOW (ref 39–117)
BUN: 14 mg/dL (ref 6–23)
CO2: 29 meq/L (ref 19–32)
Calcium: 9.5 mg/dL (ref 8.4–10.5)
Chloride: 102 mEq/L (ref 96–112)
Creatinine, Ser: 1.06 mg/dL (ref 0.40–1.50)
GFR: 76.61 mL/min (ref >60.00)
GLUCOSE: 94 mg/dL (ref 70–99)
POTASSIUM: 3.7 meq/L (ref 3.5–5.1)
SODIUM: 137 meq/L (ref 135–145)
TOTAL PROTEIN: 7.5 g/dL (ref 6.0–8.3)
Total Bilirubin: 0.6 mg/dL (ref 0.2–1.2)

## 2015-11-09 LAB — CK: Total CK: 135 U/L (ref 7–232)

## 2015-11-09 LAB — VITAMIN B12: Vitamin B-12: 130 pg/mL — ABNORMAL LOW (ref 211–911)

## 2015-11-09 LAB — SEDIMENTATION RATE: SED RATE: 5 mm/h (ref 0–20)

## 2015-11-09 NOTE — Progress Notes (Signed)
[name] [name] was seen today in the movement disorders clinic for neurologic consultation at the request of Viviana Simpler, MD.  The consultation is for the evaluation of R hand tremor x 1 year.  The records that were made available to me were reviewed.  Pt also brought in records/films from [facility].  Pt states that he initially thought that tremor was coming from a problem in his cervical spine.  He was having neck and R arm pain and went to [facility].  I did review an MRI of his cervical spine that he brought me, dated 02/13/2013.  There was a moderate to large disc protrusion at the C6-C7 level which compressed the right C7 nerve root.  There was no associated spinal cord signal change at that level.  There was evidence of a syringomyelia but no cord expansion.  It was recommended that the patient undergo a repeat contrasted examination as well as an MRI of the thoracic spine.  I do not see that this was completed, at least on the images that he brought me and the images that are available through Epic.  He reports that it was not done.  He had injections and the pain got better for about 6 months but came back but he didn't follow back up.    07/29/15 update: The patient follows up today regarding his new diagnosis of Parkinson's disease, accompanied by his wife who supplements the history.  He did not want to start on any new medications last visit.  He has been about the same except that he has noted some occasional tremor in the left upper extremity which is new.  No falls.  No hallucinations.  No lightheadedness or near syncope.  He did have an MRI of the brain without gadolinium on 06/30/2015.  I reviewed this and it was negative.  He was supposed to have an MRI of the cervical and thoracic spine.  This was primarily supposed to be a follow-up from 2014, at which point he had cervical spinal stenosis with syringomyelia and it was recommended at that time that he have a thoracic scan to rule out a  structural lesion.  He did not have it done then, so I ordered at last visit, but he he initially needed sedation.  I prescribed him Valium, but he did not go back and have the scan done.    11/09/15 update:  The patient follows up today.  Last visit, I started the patient on Mirapex ER, 1.5 mg daily.  The patient states that it has definitely helped the tremor.  He denies compulsive behaviors.  No sleep attacks.  No falls but isn't as "sure footed" as was in the past.  No hallucinations.  No lightheadedness or near syncope.  He was supposed to have an MRI of the thoracic spine to follow-up on the syrinx, but still has not done that.  States that he was having pain around rib cage that he can't describe and the right was worse than the left.  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  Little better in last 3 days.  Been on vytorin for few years.  Having voice trail off at end of sentences.  Notices some drooling.  Is planning on getting exercise bike.    Admits to drinking 1/3 bottle of vodka per day  Neuroimaging of the brain has previously been performed.  It was  apparently done years ago for headache.   ALLERGIES:   Allergies  Allergen Reactions  . Hydrocodone Nausea Only    CURRENT MEDICATIONS:  Outpatient Encounter Prescriptions as of 11/09/2015  Medication Sig  . KLOR-CON M10 10 MEQ tablet TAKE 1 TABLET BY MOUTH TWICE DAILY.  Marland Kitchen losartan-hydrochlorothiazide (HYZAAR) 100-25 MG tablet TAKE 1 TABLET BY MOUTH DAILY.  Marland Kitchen MATZIM LA 360 MG 24 hr tablet TAKE 1 TABLET BY MOUTH DAILY.  . Pramipexole Dihydrochloride (MIRAPEX ER) 1.5 MG TB24 Take 1 tablet (1.5 mg total) by mouth daily.  Marland Kitchen VYTORIN 10-20 MG tablet TAKE 1 TABLET BY MOUTH AT BEDTIME.  . [DISCONTINUED] Pramipexole Dihydrochloride (MIRAPEX ER) 0.375 MG TB24 take 1 tablet daily for one week, then take 2 tablets  daily for one week. You will then switch to the Mirapex 1.5 mg  tablets.   No facility-administered encounter medications on file as of 11/09/2015.    PAST MEDICAL HISTORY:   Past Medical History  Diagnosis Date  . Hyperlipidemia   . Hypertension   . Cancer ([place])     skin, basal cell face and abdomen  . Herniated disc, cervical     PAST SURGICAL HISTORY:   Past Surgical History  Procedure Laterality Date  . Hernia repair      age 43  . Basal cell carcinoma excision    . Eye surgery      as a child  . Finger fracture surgery      5th digit, left hand, x2    SOCIAL HISTORY:   Social History   Social History  . Marital Status: Married    Spouse Name: N/A  . Number of Children: 1  . Years of Education: N/A   Occupational History  . 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 Smoking status: Never Smoker   . Smokeless tobacco: Former Systems developer     Comment: Dip Only.  . Alcohol Use: 0.0 oz/week    0 Standard drinks or equivalent per week     Comment: 3-4 drinks a night  . Drug Use: No  . Sexual Activity: Not on file   Other Topics Concern  . 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 Father Deceased 32    MI, DM  . Mother Alive     HTN  . Brother Deceased     liver problems  . Brother Alive     DM  . Sister Alive     HTN    ROS:  A complete 10 system review of systems was obtained and was unremarkable apart from what is mentioned above.  PHYSICAL EXAMINATION:    VITALS:   Filed Vitals:   11/09/15 1502  BP: 110/68  Pulse: 62  Height: 5\' 11"  (1.803 m)  Weight: 223 lb (101.152 kg)    GEN:  The patient appears stated age and is in NAD. HEENT:  Normocephalic, atraumatic.  The mucous membranes are moist. The  superficial temporal arteries are without ropiness or tenderness.Pooling of saliva about the corner of the mouth is noted. CV:  RRR With ectopic beats Lungs:  CTAB Neck/HEME:  There are no carotid bruits bilaterally.  Neurological examination:  Orientation: The patient is alert and oriented x3. Fund of knowledge is appropriate.  Recent and remote memory are intact.  Attention and concentration are normal.    Able to name objects and repeat phrases. Cranial nerves: There is good facial symmetry. There is facial hypomimia.   The speech is fluent and clear. Minimal trouble with gutteral sounds.  Soft palate rises symmetrically and there is no tongue deviation. Hearing is intact to conversational tone. Sensation: Sensation is intact to light touch throughout. Motor: Strength is 5/5 in the bilateral upper and lower extremities.   Shoulder shrug is equal and symmetric.  There is no pronator drift.   Movement examination: Tone: There is mild rigidity with activation only in the RUE.  Tone elsewhere is normal.   Abnormal movements: No resting tremor on the right today.  No jaw tremor today. Coordination:  There is decremation with RAM's, only with alternation of supination/pronation of the forearm on the right. Gait and Station: The patient has no difficulty arising out of a deep-seated chair without the use of the hands. The patient's stride length is normal.  Armswing is decreased.  The patient has a negative pull test.      Lab Results  Component Value Date   TSH 1.51 06/24/2015     Chemistry      Component Value Date/Time   NA 141 04/26/2015 1627   K 3.9 04/26/2015 1627   CL 100 04/26/2015 1627   CO2 31 04/26/2015 1627   BUN 12 04/26/2015 1627   CREATININE 1.02 04/26/2015 1627      Component Value Date/Time   CALCIUM 9.9 04/26/2015 1627   ALKPHOS 31* 04/26/2015 1627   AST 26 04/26/2015 1627   ALT 26 04/26/2015 1627   BILITOT 0.6 04/26/2015 1627       ASSESSMENT/PLAN:  1.    Parkinson's disease.  The patient has tremor, bradykinesia, mild rigidity.  -Continue Mirapex ER, 1.5 Mill grams daily.  Denies compulsive behaviors.  Denies sleep attacks.  -We again discussed community resources in the area including patient support groups and community exercise programs for PD and pt education was provided to the patient.  -talked to him about extreme importance of safe, CV exercise.  He reports that he is planning on getting a stationary bike. 2.  EtOH overuse  -talked to him once again about the importance of decreasing and subsequently discontinuing his alcohol.  He is drinking one third of a bottle of vodka per day. 3.  Myalgias with pain around the rib cage, right more than left.  -Patient but this was a side effect of Mirapex.  I very much doubt this.  He is convinced that he did not hurt himself doing home improvements at work.  I will check lab work to include chemistry, CPK, aldolase, sedimentation rate.  Told him that his Vytorin could cause myalgia.  Also talked to him about the fact that he needs to have his syrinx evaluated, as that could cause strange sensations about the trunk.   3.  Cervical spinal stenosis with syringomyelia  -Needs further investigation.  MRI last done in 2014 and it was recommended then to do contrasted scan and do MRI thoracic scan to rule out structural lesion.  This was not followed up on at the time and we will need to look at this.  I talked to him about importance but feel doubtful he will follow through with this scan. 5.  F/u 3 months, sooner should  new neuro issues arise.  Much greater than 50% of this visit was spent in counseling with the patient and the family.  Total face to face time:  25 min

## 2015-11-10 ENCOUNTER — Telehealth: Payer: Self-pay | Admitting: Neurology

## 2015-11-10 NOTE — Telephone Encounter (Signed)
-----   Message from [place], DO sent at 11/10/2015  9:45 AM EDT ----- Let pt know that B12 is very low.  Would recommend starting injection, 1 per week x 4 weeks and then monthly.  Stress to patient importance of repeating MRI spine again

## 2015-11-10 NOTE — Telephone Encounter (Signed)
Patient made aware. B12 scheduled.

## 2015-11-11 LAB — ALDOLASE: ALDOLASE: 3.2 U/L (ref <=8.1)

## 2015-11-12 ENCOUNTER — Ambulatory Visit: Payer: BLUE CROSS/BLUE SHIELD

## 2015-11-15 ENCOUNTER — Other Ambulatory Visit: Payer: Self-pay | Admitting: Neurology

## 2015-11-15 ENCOUNTER — Ambulatory Visit (INDEPENDENT_AMBULATORY_CARE_PROVIDER_SITE_OTHER): Payer: BLUE CROSS/BLUE SHIELD | Admitting: Neurology

## 2015-11-15 DIAGNOSIS — E538 Deficiency of other specified B group vitamins: Secondary | ICD-10-CM

## 2015-11-15 MED ORDER — CYANOCOBALAMIN 1000 MCG/ML IJ SOLN
1000.0000 ug | Freq: Once | INTRAMUSCULAR | Status: AC
  Administered 2015-11-15: 1000 ug via INTRAMUSCULAR

## 2015-11-15 NOTE — Telephone Encounter (Signed)
Pramipexole refill requested. Per last office note- patient to remain on medication. Refill approved and sent to patient's pharmacy.

## 2015-11-15 NOTE — Progress Notes (Signed)
B12 injection to left deltoid with no apparent complications.   

## 2015-11-22 ENCOUNTER — Ambulatory Visit: Payer: BLUE CROSS/BLUE SHIELD | Admitting: Neurology

## 2015-11-22 NOTE — Progress Notes (Signed)
Patient no showed appt

## 2015-11-22 NOTE — Progress Notes (Deleted)
B12 injection to left deltoid with no apparent complications.   

## 2015-11-29 ENCOUNTER — Ambulatory Visit (INDEPENDENT_AMBULATORY_CARE_PROVIDER_SITE_OTHER): Payer: BLUE CROSS/BLUE SHIELD | Admitting: Neurology

## 2015-11-29 DIAGNOSIS — E538 Deficiency of other specified B group vitamins: Secondary | ICD-10-CM | POA: Diagnosis not present

## 2015-11-29 MED ORDER — CYANOCOBALAMIN 1000 MCG/ML IJ SOLN
1000.0000 ug | Freq: Once | INTRAMUSCULAR | Status: AC
  Administered 2015-11-29: 1000 ug via INTRAMUSCULAR

## 2015-11-29 NOTE — Progress Notes (Signed)
B12 injection to left deltoid with no apparent complications.   

## 2015-12-06 ENCOUNTER — Ambulatory Visit (INDEPENDENT_AMBULATORY_CARE_PROVIDER_SITE_OTHER): Payer: BLUE CROSS/BLUE SHIELD | Admitting: Neurology

## 2015-12-06 DIAGNOSIS — E538 Deficiency of other specified B group vitamins: Secondary | ICD-10-CM | POA: Diagnosis not present

## 2015-12-06 MED ORDER — CYANOCOBALAMIN 1000 MCG/ML IJ SOLN
1000.0000 ug | Freq: Once | INTRAMUSCULAR | Status: AC
  Administered 2015-12-06: 1000 ug via INTRAMUSCULAR

## 2015-12-06 NOTE — Progress Notes (Signed)
B12 injection to left deltoid with no apparent complications.   

## 2015-12-13 ENCOUNTER — Ambulatory Visit (INDEPENDENT_AMBULATORY_CARE_PROVIDER_SITE_OTHER): Payer: BLUE CROSS/BLUE SHIELD | Admitting: Neurology

## 2015-12-13 DIAGNOSIS — E538 Deficiency of other specified B group vitamins: Secondary | ICD-10-CM

## 2015-12-13 MED ORDER — CYANOCOBALAMIN 1000 MCG/ML IJ SOLN
1000.0000 ug | Freq: Once | INTRAMUSCULAR | Status: AC
  Administered 2015-12-13: 1000 ug via INTRAMUSCULAR

## 2015-12-13 NOTE — Progress Notes (Signed)
B12 injection to right deltoid with no apparent complications.   

## 2016-02-15 ENCOUNTER — Other Ambulatory Visit: Payer: Self-pay | Admitting: Neurology

## 2016-02-15 MED ORDER — PRAMIPEXOLE DIHYDROCHLORIDE ER 1.5 MG PO TB24: ORAL_TABLET | ORAL | 0 refills | Status: DC

## 2016-02-18 ENCOUNTER — Ambulatory Visit (INDEPENDENT_AMBULATORY_CARE_PROVIDER_SITE_OTHER): Payer: BLUE CROSS/BLUE SHIELD | Admitting: Neurology

## 2016-02-18 DIAGNOSIS — E538 Deficiency of other specified B group vitamins: Secondary | ICD-10-CM | POA: Diagnosis not present

## 2016-02-18 MED ORDER — CYANOCOBALAMIN 1000 MCG/ML IJ SOLN
1000.0000 ug | Freq: Once | INTRAMUSCULAR | Status: AC
  Administered 2016-02-18: 1000 ug via INTRAMUSCULAR

## 2016-02-18 NOTE — Progress Notes (Signed)
B12 injection to left deltoid with no apparent complications.   

## 2016-03-11 NOTE — Progress Notes (Signed)
[name] [name] was seen today in the movement disorders clinic for neurologic consultation at the request of Viviana Simpler, MD.  The consultation is for the evaluation of R hand tremor x 1 year.  The records that were made available to me were reviewed.  Pt also brought in records/films from [facility].  Pt states that he initially thought that tremor was coming from a problem in his cervical spine.  He was having neck and R arm pain and went to [facility].  I did review an MRI of his cervical spine that he brought me, dated 02/13/2013.  There was a moderate to large disc protrusion at the C6-C7 level which compressed the right C7 nerve root.  There was no associated spinal cord signal change at that level.  There was evidence of a syringomyelia but no cord expansion.  It was recommended that the patient undergo a repeat contrasted examination as well as an MRI of the thoracic spine.  I do not see that this was completed, at least on the images that he brought me and the images that are available through Epic.  He reports that it was not done.  He had injections and the pain got better for about 6 months but came back but he didn't follow back up.    07/29/15 update: The patient follows up today regarding his new diagnosis of Parkinson's disease, accompanied by his wife who supplements the history.  He did not want to start on any new medications last visit.  He has been about the same except that he has noted some occasional tremor in the left upper extremity which is new.  No falls.  No hallucinations.  No lightheadedness or near syncope.  He did have an MRI of the brain without gadolinium on 06/30/2015.  I reviewed this and it was negative.  He was supposed to have an MRI of the cervical and thoracic spine.  This was primarily supposed to be a follow-up from 2014, at which point he had cervical spinal stenosis with syringomyelia and it was recommended at that time that he have a thoracic scan to rule out a  structural lesion.  He did not have it done then, so I ordered at last visit, but he he initially needed sedation.  I prescribed him Valium, but he did not go back and have the scan done.    11/09/15 update:  The patient follows up today.  Last visit, I started the patient on Mirapex ER, 1.5 mg daily.  The patient states that it has definitely helped the tremor.  He denies compulsive behaviors.  No sleep attacks.  No falls but isn't as "sure footed" as was in the past.  No hallucinations.  No lightheadedness or near syncope.  He was supposed to have an MRI of the thoracic spine to follow-up on the syrinx, but still has not done that.  States that he was having pain around rib cage that he can't describe and the right was worse than the left.  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  Little better in last 3 days.  Been on vytorin for few years.  Having voice trail off at end of sentences.  Notices some drooling.  Is planning on getting exercise bike.    Admits to drinking 1/3 bottle of vodka per day  03/14/16 update:  Pt f/u today.  On mirapex 1.5 mg  daily.  Denies compulsive behaviors (admits to excessive EtOH but this was prior to mirapex).  He does state that he has dropped his alcohol some, but despite urging, he is unable to tell me how much he is drinking.  Has hx of syringomyelia but has refused to go to follow up on that despite urging.  Has a very low B12 level of 130.  Does request his B12 injection today.  He bought himself a bike but hasn't done it in a while.    Neuroimaging of the brain has previously been performed.  It was apparently done years ago for headache.   ALLERGIES:   Allergies  Allergen Reactions  . Hydrocodone Nausea Only    CURRENT MEDICATIONS:  Outpatient Encounter Prescriptions as of 03/14/2016  Medication Sig  . KLOR-CON M10 10 MEQ tablet TAKE 1 TABLET BY MOUTH TWICE DAILY.  Marland Kitchen  losartan-hydrochlorothiazide (HYZAAR) 100-25 MG tablet TAKE 1 TABLET BY MOUTH DAILY.  Marland Kitchen MATZIM LA 360 MG 24 hr tablet TAKE 1 TABLET BY MOUTH DAILY.  . Pramipexole Dihydrochloride 1.5 MG TB24 TAKE 1 TABLET (1.5 MG TOTAL) BY MOUTH DAILY.  . VYTORIN 10-20 MG tablet TAKE 1 TABLET BY MOUTH AT BEDTIME.  . [EXPIRED] cyanocobalamin ((VITAMIN B-12)) injection 1,000 mcg    No facility-administered encounter medications on file as of 03/14/2016.     PAST MEDICAL HISTORY:   Past Medical History:  Diagnosis Date  . Cancer ([place])    skin, basal cell face and abdomen  . Herniated disc, cervical   . Hyperlipidemia   . Hypertension     PAST SURGICAL HISTORY:   Past Surgical History:  Procedure Laterality Date  . BASAL CELL CARCINOMA EXCISION    . EYE SURGERY     as a child  . FINGER FRACTURE SURGERY     5th digit, left hand, x2  . HERNIA REPAIR     age 51    SOCIAL HISTORY:   Social History   Social History  . Marital status: Married    Spouse name: N/A  . Number of children: 1  . Years of education: N/A   Occupational History  . 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 Smoking status: Never Smoker  . Smokeless tobacco: Former Systems developer     Comment: Dip Only.  . Alcohol use 0.0 oz/week     Comment: 3-4 drinks a night  . Drug use: No  . Sexual activity: Not on file   Other Topics Concern  . 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 Father Deceased at age 107   MI, DM  . Mother Alive   HTN  . Brother Deceased   liver problems  . Brother Alive   DM  . Sister Alive   HTN    ROS:  A complete 10 system review of systems was obtained and was unremarkable apart from what is mentioned  above.  PHYSICAL EXAMINATION:    VITALS:   Vitals:   03/14/16 1522  BP: 124/82  Pulse: (!) 52  Weight: 230 lb (104.3 kg)  Height: 5\' 11"  (1.803 m)   Wt Readings from Last 3 Encounters:  03/14/16 230  lb (104.3 kg)  11/09/15 223 lb (101.2 kg)  07/29/15 225 lb (102.1 kg)     GEN:  The patient appears stated age and is in NAD. HEENT:  Normocephalic, atraumatic.  The mucous membranes are moist. The superficial temporal arteries are without ropiness or tenderness.Pooling of saliva about the corner of the mouth is noted. CV:  RRR With ectopic beats Lungs:  CTAB Neck/HEME:  There are no carotid bruits bilaterally.  Neurological examination:  Orientation: The patient is alert and oriented x3. Fund of knowledge is appropriate.  Recent and remote memory are intact.  Attention and concentration are normal.    Able to name objects and repeat phrases. Cranial nerves: There is good facial symmetry. There is facial hypomimia.   The speech is fluent and clear. Minimal trouble with gutteral sounds.  Soft palate rises symmetrically and there is no tongue deviation. Hearing is intact to conversational tone. Sensation: Sensation is intact to light touch throughout. Motor: Strength is 5/5 in the bilateral upper and lower extremities.   Shoulder shrug is equal and symmetric.  There is no pronator drift.   Movement examination: Tone: There is mild rigidity with activation only in the RUE.  Tone elsewhere is normal.   Abnormal movements: No resting tremor on the right today.  Very rare tremor is noted in the left thumb.  No jaw tremor today. Coordination:  There is good rapid alternating movements today. Gait and Station: The patient has no difficulty arising out of a deep-seated chair without the use of the hands. The patient's stride length is normal.  Armswing is decreased.  He is able to run down the hall without difficulty.  Lab Results  Component Value Date   TSH 1.51 06/24/2015     Chemistry       Component Value Date/Time   NA 137 11/09/2015 1532   K 3.7 11/09/2015 1532   CL 102 11/09/2015 1532   CO2 29 11/09/2015 1532   BUN 14 11/09/2015 1532   CREATININE 1.06 11/09/2015 1532      Component Value Date/Time   CALCIUM 9.5 11/09/2015 1532   ALKPHOS 27 (L) 11/09/2015 1532   AST 24 11/09/2015 1532   ALT 27 11/09/2015 1532   BILITOT 0.6 11/09/2015 1532     Lab Results  Component Value Date   VITAMINB12 130 (L) 11/09/2015     ASSESSMENT/PLAN:  1.   Parkinson's disease.  The patient has tremor, bradykinesia, mild rigidity.  -Continue Mirapex ER, 1.5 mg daily.  Move to nighttime and see if it helps afternoon sleepiness.  Denies compulsive behaviors.  Denies sleep attacks.  -We again discussed community resources in the area including patient support groups and community exercise programs for PD and pt education was provided to the patient.  -talked to him about extreme importance of safe, CV exercise.  He has gotten a stationary/recumbent bike but has stopped using it.  Encouraged him to start that. 2.  EtOH overuse  -talked to him once again about the importance of decreasing and subsequently discontinuing his alcohol.  He is drinking excessively. 3.  B12 deficiency  -B12 injection given today. 4.  Cervical spinal stenosis with syringomyelia  -Needs further investigation.  MRI last done in 2014 and it was recommended then to do contrasted scan and do MRI thoracic scan to rule out structural lesion.  This was not followed up on at the time and we will need to look at this.  I talked to him about importance but  he does not wish to follow through with that.  His insurance monthly premium just rose dramatically.  He understands the risks. 5.  F/u 3 months, sooner should new neuro issues arise.  Much greater than 50% of this visit was spent in counseling with the patient and the family.  Total face to face time:  25 min

## 2016-03-14 ENCOUNTER — Ambulatory Visit (INDEPENDENT_AMBULATORY_CARE_PROVIDER_SITE_OTHER): Payer: BLUE CROSS/BLUE SHIELD | Admitting: Neurology

## 2016-03-14 ENCOUNTER — Encounter: Payer: Self-pay | Admitting: Neurology

## 2016-03-14 VITALS — BP 124/82 | HR 52 | Ht 71.0 in | Wt 230.0 lb

## 2016-03-14 DIAGNOSIS — E538 Deficiency of other specified B group vitamins: Secondary | ICD-10-CM | POA: Diagnosis not present

## 2016-03-14 DIAGNOSIS — F1099 Alcohol use, unspecified with unspecified alcohol-induced disorder: Secondary | ICD-10-CM

## 2016-03-14 DIAGNOSIS — G20 Parkinson's disease: Secondary | ICD-10-CM | POA: Diagnosis not present

## 2016-03-14 DIAGNOSIS — IMO0002 Reserved for concepts with insufficient information to code with codable children: Secondary | ICD-10-CM

## 2016-03-14 MED ORDER — PRAMIPEXOLE DIHYDROCHLORIDE ER 1.5 MG PO TB24: ORAL_TABLET | ORAL | 1 refills | Status: DC

## 2016-03-14 MED ORDER — CYANOCOBALAMIN 1000 MCG/ML IJ SOLN
1000.0000 ug | Freq: Once | INTRAMUSCULAR | Status: AC
  Administered 2016-03-14: 1000 ug via INTRAMUSCULAR

## 2016-03-31 ENCOUNTER — Other Ambulatory Visit: Payer: Self-pay | Admitting: Internal Medicine

## 2016-04-05 ENCOUNTER — Other Ambulatory Visit: Payer: Self-pay | Admitting: Internal Medicine

## 2016-04-14 ENCOUNTER — Ambulatory Visit (INDEPENDENT_AMBULATORY_CARE_PROVIDER_SITE_OTHER): Payer: BLUE CROSS/BLUE SHIELD | Admitting: Neurology

## 2016-04-14 DIAGNOSIS — E538 Deficiency of other specified B group vitamins: Secondary | ICD-10-CM

## 2016-04-14 MED ORDER — CYANOCOBALAMIN 1000 MCG/ML IJ SOLN
1000.0000 ug | Freq: Once | INTRAMUSCULAR | Status: AC
  Administered 2016-04-14: 1000 ug via INTRAMUSCULAR

## 2016-04-14 NOTE — Progress Notes (Signed)
B12 injection to left deltoid with no apparent complications.   

## 2016-04-27 ENCOUNTER — Ambulatory Visit (INDEPENDENT_AMBULATORY_CARE_PROVIDER_SITE_OTHER): Payer: BLUE CROSS/BLUE SHIELD | Admitting: Internal Medicine

## 2016-04-27 ENCOUNTER — Encounter: Payer: Self-pay | Admitting: Internal Medicine

## 2016-04-27 VITALS — BP 110/76 | HR 58 | Temp 98.5°F | Ht 69.75 in | Wt 234.0 lb

## 2016-04-27 DIAGNOSIS — G20 Parkinson's disease: Secondary | ICD-10-CM

## 2016-04-27 DIAGNOSIS — G20A1 Parkinson's disease without dyskinesia, without mention of fluctuations: Secondary | ICD-10-CM

## 2016-04-27 DIAGNOSIS — Z0000 Encounter for general adult medical examination without abnormal findings: Secondary | ICD-10-CM

## 2016-04-27 DIAGNOSIS — Z23 Encounter for immunization: Secondary | ICD-10-CM

## 2016-04-27 DIAGNOSIS — E785 Hyperlipidemia, unspecified: Secondary | ICD-10-CM | POA: Diagnosis not present

## 2016-04-27 DIAGNOSIS — I10 Essential (primary) hypertension: Secondary | ICD-10-CM

## 2016-04-27 DIAGNOSIS — Z1211 Encounter for screening for malignant neoplasm of colon: Secondary | ICD-10-CM

## 2016-04-27 HISTORY — DX: Parkinson's disease: G20

## 2016-04-27 HISTORY — DX: Parkinson's disease without dyskinesia, without mention of fluctuations: G20.A1

## 2016-04-27 NOTE — Assessment & Plan Note (Signed)
BP Readings from Last 3 Encounters:  04/27/16 110/76  03/14/16 124/82  11/09/15 110/68   If any dizziness, will cut back on meds (pramipexole can affect BP--plus with the PD)

## 2016-04-27 NOTE — Assessment & Plan Note (Signed)
No problems with statin and diltiazem with long use

## 2016-04-27 NOTE — Progress Notes (Signed)
Pre visit review using our clinic review tool, if applicable. No additional management support is needed unless otherwise documented below in the visit note. 

## 2016-04-27 NOTE — Assessment & Plan Note (Signed)
Now seeing Dr Tat On pramipexole

## 2016-04-27 NOTE — Progress Notes (Signed)
Subjective:    Patient ID: [name] [name], male    DOB: June 11, 1960, 55 y.o.   MRN: [id number]  HPI Here for physical  Did have Parkinson's confirmed by Dr Tat Now on pramipexole Has put on some weight around his middle--has discomfort there No injury though  No set exercise--just works (lifting, walking ,etc)  Current Outpatient Prescriptions on File Prior to Visit  Medication Sig Dispense Refill  . KLOR-CON M10 10 MEQ tablet TAKE 1 TABLET BY MOUTH TWICE DAILY. 60 tablet 0  . losartan-hydrochlorothiazide (HYZAAR) 100-25 MG tablet TAKE 1 TABLET BY MOUTH DAILY. 90 tablet 3  . MATZIM LA 360 MG 24 hr tablet TAKE 1 TABLET BY MOUTH DAILY. 90 tablet 0  . Pramipexole Dihydrochloride 1.5 MG TB24 TAKE 1 TABLET (1.5 MG TOTAL) BY MOUTH DAILY. 90 tablet 1  . VYTORIN 10-20 MG tablet TAKE 1 TABLET BY MOUTH AT BEDTIME. 90 tablet 3  . [DISCONTINUED] potassium chloride (K-DUR) 10 MEQ tablet Take 1 tablet (10 mEq total) by mouth 2 (two) times daily. 60 tablet 2   No current facility-administered medications on file prior to visit.     Allergies  Allergen Reactions  . Hydrocodone Nausea Only    Past Medical History:  Diagnosis Date  . Cancer ([place])    skin, basal cell face and abdomen  . Herniated disc, cervical   . Hyperlipidemia   . Hypertension     Past Surgical History:  Procedure Laterality Date  . BASAL CELL CARCINOMA EXCISION    . EYE SURGERY     as a child  . FINGER FRACTURE SURGERY     5th digit, left hand, x2  . HERNIA REPAIR     age 60    Family History  Problem Relation Age of Onset  . Hypertension Mother   . Diabetes Father   . Hypertension Father   . Heart attack Father     Social History   Social History  . Marital status: Married    Spouse name: N/A  . Number of children: 1  . Years of education: N/A   Occupational History  . 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 Smoking status: Never Smoker  . Smokeless tobacco:  Former Systems developer     Comment: Dip Only.  . Alcohol use 0.0 oz/week     Comment: 3-4 drinks a night  . Drug use: No  . Sexual activity: Not on file   Other Topics Concern  . 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 Negative for fatigue.       Weight up 13# Wears seat belt  HENT: Negative for dental problem, hearing loss and tinnitus.        Keeps up with dentist  Eyes: Negative for visual disturbance.       No diplopia or unilateral vision loss  Respiratory: Negative for cough, chest tightness and shortness of breath.   Cardiovascular: Positive for palpitations. Negative for chest pain and leg swelling.  Gastrointestinal: Negative for blood in stool, constipation, nausea and vomiting.       Rare heartburn--- rolaids works (rare)  Endocrine: Negative for polydipsia and polyuria.  Genitourinary: Negative for  difficulty urinating and urgency.       No sexual problems  Musculoskeletal: Negative for arthralgias, back pain and joint swelling.  Skin: Negative for rash.       No suspicious lesions  Allergic/Immunologic: Negative for environmental allergies and immunocompromised state.  Neurological: Positive for tremors and headaches. Negative for dizziness and syncope.       May have mild orthostatic tinnitus (not dizziness)  Hematological: Negative for adenopathy. Does not bruise/bleed easily.  Psychiatric/Behavioral: Negative for sleep disturbance. The patient is not nervous/anxious.        More sleepiness in afternoon after work--may take 1 hour nap. Still sleeps okay Mood has taken a hit with the Parkinson's        Objective:   Physical Exam  Constitutional: He is oriented to person, place,  and time. He appears well-developed and well-nourished. No distress.  HENT:  Head: Normocephalic and atraumatic.  Right Ear: External ear normal.  Left Ear: External ear normal.  Mouth/Throat: Oropharynx is clear and moist. No oropharyngeal exudate.  Eyes: Conjunctivae are normal. Pupils are equal, round, and reactive to light.  Neck: Normal range of motion. Neck supple. No thyromegaly present.  Cardiovascular: Normal rate, regular rhythm, normal heart sounds and intact distal pulses.  Exam reveals no gallop.   No murmur heard. Pulmonary/Chest: Effort normal and breath sounds normal. No respiratory distress. He has no wheezes. He has no rales.  Abdominal: Soft. There is no tenderness.  Musculoskeletal: He exhibits no edema or tenderness.  Lymphadenopathy:    He has no cervical adenopathy.  Neurological: He is alert and oriented to person, place, and time.  Skin: No rash noted.  Multiple benign nevi--suggested regular derm visits  Psychiatric: He has a normal mood and affect. His behavior is normal.          Assessment & Plan:

## 2016-04-27 NOTE — Patient Instructions (Signed)
DASH Eating Plan DASH stands for "Dietary Approaches to Stop Hypertension." The DASH eating plan is a healthy eating plan that has been shown to reduce high blood pressure (hypertension). Additional health benefits may include reducing the risk of type 2 diabetes mellitus, heart disease, and stroke. The DASH eating plan may also help with weight loss. What do I need to know about the DASH eating plan? For the DASH eating plan, you will follow these general guidelines:  Choose foods with less than 150 milligrams of sodium per serving (as listed on the food label).  Use salt-free seasonings or herbs instead of table salt or sea salt.  Check with your health care provider or pharmacist before using salt substitutes.  Eat lower-sodium products. These are often labeled as "low-sodium" or "no salt added."  Eat fresh foods. Avoid eating a lot of canned foods.  Eat more vegetables, fruits, and low-fat dairy products.  Choose whole grains. Look for the word "whole" as the first word in the ingredient list.  Choose fish and skinless chicken or turkey more often than red meat. Limit fish, poultry, and meat to 6 oz (170 g) each day.  Limit sweets, desserts, sugars, and sugary drinks.  Choose heart-healthy fats.  Eat more home-cooked food and less restaurant, buffet, and fast food.  Limit fried foods.  Do not fry foods. Cook foods using methods such as baking, boiling, grilling, and broiling instead.  When eating at a restaurant, ask that your food be prepared with less salt, or no salt if possible. What foods can I eat? Seek help from a dietitian for individual calorie needs. Grains  Whole grain or whole wheat bread. Brown rice. Whole grain or whole wheat pasta. Quinoa, bulgur, and whole grain cereals. Low-sodium cereals. Corn or whole wheat flour tortillas. Whole grain cornbread. Whole grain crackers. Low-sodium crackers. Vegetables  Fresh or frozen vegetables (raw, steamed, roasted, or  grilled). Low-sodium or reduced-sodium tomato and vegetable juices. Low-sodium or reduced-sodium tomato sauce and paste. Low-sodium or reduced-sodium canned vegetables. Fruits  All fresh, canned (in natural juice), or frozen fruits. Meat and Other Protein Products  Ground beef (85% or leaner), grass-fed beef, or beef trimmed of fat. Skinless chicken or turkey. Ground chicken or turkey. Pork trimmed of fat. All fish and seafood. Eggs. Dried beans, peas, or lentils. Unsalted nuts and seeds. Unsalted canned beans. Dairy  Low-fat dairy products, such as skim or 1% milk, 2% or reduced-fat cheeses, low-fat ricotta or cottage cheese, or plain low-fat yogurt. Low-sodium or reduced-sodium cheeses. Fats and Oils  Tub margarines without trans fats. Light or reduced-fat mayonnaise and salad dressings (reduced sodium). Avocado. Safflower, olive, or canola oils. Natural peanut or almond butter. Other  Unsalted popcorn and pretzels. The items listed above may not be a complete list of recommended foods or beverages. Contact your dietitian for more options.  What foods are not recommended? Grains  White bread. White pasta. White rice. Refined cornbread. Bagels and croissants. Crackers that contain trans fat. Vegetables  Creamed or fried vegetables. Vegetables in a cheese sauce. Regular canned vegetables. Regular canned tomato sauce and paste. Regular tomato and vegetable juices. Fruits  Canned fruit in light or heavy syrup. Fruit juice. Meat and Other Protein Products  Fatty cuts of meat. Ribs, chicken wings, bacon, sausage, bologna, salami, chitterlings, fatback, hot dogs, bratwurst, and packaged luncheon meats. Salted nuts and seeds. Canned beans with salt. Dairy  Whole or 2% milk, cream, half-and-half, and cream cheese. Whole-fat or sweetened yogurt. Full-fat cheeses   or blue cheese. Nondairy creamers and whipped toppings. Processed cheese, cheese spreads, or cheese curds. Condiments  Onion and garlic  salt, seasoned salt, table salt, and sea salt. Canned and packaged gravies. Worcestershire sauce. Tartar sauce. Barbecue sauce. Teriyaki sauce. Soy sauce, including reduced sodium. Steak sauce. Fish sauce. Oyster sauce. Cocktail sauce. Horseradish. Ketchup and mustard. Meat flavorings and tenderizers. Bouillon cubes. Hot sauce. Tabasco sauce. Marinades. Taco seasonings. Relishes. Fats and Oils  Butter, stick margarine, lard, shortening, ghee, and bacon fat. Coconut, palm kernel, or palm oils. Regular salad dressings. Other  Pickles and olives. Salted popcorn and pretzels. The items listed above may not be a complete list of foods and beverages to avoid. Contact your dietitian for more information.  Where can I find more information? National Heart, Lung, and Blood Institute: www.nhlbi.nih.gov/health/health-topics/topics/dash/ This information is not intended to replace advice given to you by your health care provider. Make sure you discuss any questions you have with your health care provider. Document Released: 05/04/2011 Document Revised: 10/21/2015 Document Reviewed: 03/19/2013 Elsevier Interactive Patient Education  2017 Elsevier Inc.  

## 2016-04-27 NOTE — Assessment & Plan Note (Signed)
Discussed fitness--DASH info given Defer PSA till at least next year FIT--urged him to do it this year Flu vaccine given

## 2016-04-28 LAB — COMPREHENSIVE METABOLIC PANEL
ALBUMIN: 4.7 g/dL (ref 3.5–5.2)
ALK PHOS: 31 U/L — AB (ref 39–117)
ALT: 34 U/L (ref 0–53)
AST: 26 U/L (ref 0–37)
BILIRUBIN TOTAL: 0.5 mg/dL (ref 0.2–1.2)
BUN: 16 mg/dL (ref 6–23)
CALCIUM: 9.6 mg/dL (ref 8.4–10.5)
CO2: 30 mEq/L (ref 19–32)
Chloride: 102 mEq/L (ref 96–112)
Creatinine, Ser: 1.1 mg/dL (ref 0.40–1.50)
GFR: 73.28 mL/min (ref >60.00)
GLUCOSE: 86 mg/dL (ref 70–99)
Potassium: 3.9 mEq/L (ref 3.5–5.1)
Sodium: 140 mEq/L (ref 135–145)
TOTAL PROTEIN: 7.5 g/dL (ref 6.0–8.3)

## 2016-04-28 LAB — CBC WITH DIFFERENTIAL/PLATELET
BASOS ABS: 0 10*3/uL (ref 0.0–0.1)
Basophils Relative: 0.4 % (ref 0.0–3.0)
EOS PCT: 1.3 % (ref 0.0–5.0)
Eosinophils Absolute: 0.1 10*3/uL (ref 0.0–0.7)
HEMATOCRIT: 44.1 % (ref 39.0–52.0)
HEMOGLOBIN: 15.1 g/dL (ref 13.0–17.0)
LYMPHS PCT: 29.7 % (ref 12.0–46.0)
Lymphs Abs: 1.8 10*3/uL (ref 0.7–4.0)
MCHC: 34.3 g/dL (ref 30.0–36.0)
MCV: 95.7 fl (ref 78.0–100.0)
MONOS PCT: 9.3 % (ref 3.0–12.0)
Monocytes Absolute: 0.6 10*3/uL (ref 0.1–1.0)
NEUTROS PCT: 59.3 % (ref 43.0–77.0)
Neutro Abs: 3.6 10*3/uL (ref 1.4–7.7)
Platelets: 184 10*3/uL (ref 150.0–400.0)
RBC: 4.61 Mil/uL (ref 4.22–5.81)
RDW: 12.6 % (ref 11.5–15.5)
WBC: 6.1 10*3/uL (ref 4.0–10.5)

## 2016-04-28 LAB — LIPID PANEL
Cholesterol: 125 mg/dL (ref 0–200)
HDL: 54.1 mg/dL
LDL Cholesterol: 35 mg/dL (ref 0–99)
NonHDL: 70.95
Total CHOL/HDL Ratio: 2
Triglycerides: 181 mg/dL — ABNORMAL HIGH (ref 0.0–149.0)
VLDL: 36.2 mg/dL (ref 0.0–40.0)

## 2016-05-03 ENCOUNTER — Other Ambulatory Visit: Payer: Self-pay | Admitting: Internal Medicine

## 2016-05-05 ENCOUNTER — Other Ambulatory Visit: Payer: Self-pay | Admitting: Internal Medicine

## 2016-05-15 ENCOUNTER — Ambulatory Visit: Payer: BLUE CROSS/BLUE SHIELD

## 2016-06-07 ENCOUNTER — Telehealth: Payer: Self-pay | Admitting: Neurology

## 2016-06-07 MED ORDER — PRAMIPEXOLE DIHYDROCHLORIDE ER 1.5 MG PO TB24: ORAL_TABLET | ORAL | 0 refills | Status: DC

## 2016-06-07 NOTE — Telephone Encounter (Signed)
RX for Mirapex sent to pharmacy.

## 2016-06-07 NOTE — Telephone Encounter (Signed)
[phone] patient needs a refill on medication called in to the [facility]  I could not understand the name of the medication

## 2016-06-18 ENCOUNTER — Other Ambulatory Visit: Payer: Self-pay | Admitting: Internal Medicine

## 2016-07-17 NOTE — Progress Notes (Signed)
[name] [name] was seen today in the movement disorders clinic for neurologic consultation at the request of [name] Simpler, MD.  The consultation is for the evaluation of R hand tremor x 1 year.  The records that were made available to me were reviewed.  Pt also brought in records/films from [facility].  Pt states that he initially thought that tremor was coming from a problem in his cervical spine.  He was having neck and R arm pain and went to [facility].  I did review an MRI of his cervical spine that he brought me, dated 02/13/2013.  There was a moderate to large disc protrusion at the C6-C7 level which compressed the right C7 nerve root.  There was no associated spinal cord signal change at that level.  There was evidence of a syringomyelia but no cord expansion.  It was recommended that the patient undergo a repeat contrasted examination as well as an MRI of the thoracic spine.  I do not see that this was completed, at least on the images that he brought me and the images that are available through Epic.  He reports that it was not done.  He had injections and the pain got better for about 6 months but came back but he didn't follow back up.    07/29/15 update: The patient follows up today regarding his new diagnosis of Parkinson's disease, accompanied by his wife who supplements the history.  He did not want to start on any new medications last visit.  He has been about the same except that he has noted some occasional tremor in the left upper extremity which is new.  No falls.  No hallucinations.  No lightheadedness or near syncope.  He did have an MRI of the brain without gadolinium on 06/30/2015.  I reviewed this and it was negative.  He was supposed to have an MRI of the cervical and thoracic spine.  This was primarily supposed to be a follow-up from 2014, at which point he had cervical spinal stenosis with syringomyelia and it was recommended at that time that he have a thoracic scan to rule out a  structural lesion.  He did not have it done then, so I ordered at last visit, but he he initially needed sedation.  I prescribed him Valium, but he did not go back and have the scan done.    11/09/15 update:  The patient follows up today.  Last visit, I started the patient on Mirapex ER, 1.5 mg daily.  The patient states that it has definitely helped the tremor.  He denies compulsive behaviors.  No sleep attacks.  No falls but isn't as "sure footed" as was in the past.  No hallucinations.  No lightheadedness or near syncope.  He was supposed to have an MRI of the thoracic spine to follow-up on the syrinx, but still has not done that.  States that he was having pain around rib cage that he can't describe and the right was worse than the left.  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  Little better in last 3 days.  Been on vytorin for few years.  Having voice trail off at end of sentences.  Notices some drooling.  Is planning on getting exercise bike.    Admits to drinking 1/3 bottle of vodka per day  03/14/16 update:  Pt f/u today.  On mirapex 1.5 mg  daily.  Denies compulsive behaviors (admits to excessive EtOH but this was prior to mirapex).  He does state that he has dropped his alcohol some, but despite urging, he is unable to tell me how much he is drinking.  Has hx of syringomyelia but has refused to go to follow up on that despite urging.  Has a very low B12 level of 130.  Does request his B12 injection today.  He bought himself a bike but hasn't done it in a while.    07/18/16 update:  Patient remains on pramipexole ER, 1.5 mg daily.  No falls; occasional loss of balance.  Has some visual distortions but no hallucinations.  States that he has been stable on this medication.  Does have B12 deficiency.  Has not been faithful with coming for B12 injections.  Last B12 injection was 04/14/2016.  When asked about how much EtOH he is drinking he  states "I'm still drinking too much."  States that his work is very physical (walking up and down stairs frequently) and that is the majority of exercise that he gets.  Neuroimaging of the brain has previously been performed.  It was apparently done years ago for headache.   ALLERGIES:   Allergies  Allergen Reactions  . Hydrocodone Nausea Only    CURRENT MEDICATIONS:  Outpatient Encounter Prescriptions as of 07/18/2016  Medication Sig  . ezetimibe-simvastatin (VYTORIN) 10-20 MG tablet TAKE 1 TABLET BY MOUTH AT BEDTIME.  Marland Kitchen KLOR-CON M10 10 MEQ tablet TAKE 1 TABLET BY MOUTH TWICE DAILY.  Marland Kitchen losartan-hydrochlorothiazide (HYZAAR) 100-25 MG tablet TAKE 1 TABLET BY MOUTH DAILY.  Marland Kitchen MATZIM LA 360 MG 24 hr tablet TAKE 1 TABLET BY MOUTH DAILY.  . Pramipexole Dihydrochloride 1.5 MG TB24 TAKE 1 TABLET (1.5 MG TOTAL) BY MOUTH DAILY.  . [DISCONTINUED] MATZIM LA 360 MG 24 hr tablet TAKE 1 TABLET BY MOUTH DAILY.  . [EXPIRED] cyanocobalamin ((VITAMIN B-12)) injection 1,000 mcg    No facility-administered encounter medications on file as of 07/18/2016.     PAST MEDICAL HISTORY:   Past Medical History:  Diagnosis Date  . Cancer ([place])    skin, basal cell face and abdomen  . Herniated disc, cervical   . Hyperlipidemia   . Hypertension     PAST SURGICAL HISTORY:   Past Surgical History:  Procedure Laterality Date  . BASAL CELL CARCINOMA EXCISION    . EYE SURGERY     as a child  . FINGER FRACTURE SURGERY     5th digit, left hand, x2  . HERNIA REPAIR     age 34    SOCIAL HISTORY:   Social History   Social History  . Marital status: Married    Spouse name: N/A  . Number of children: 1  . Years of education: N/A   Occupational History  . 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 Smoking status: Never Smoker  . Smokeless tobacco: Former Systems developer     Comment: Dip Only.  . Alcohol use 0.0 oz/week     Comment: 3-4 drinks a night  . Drug use: No  . Sexual activity:  Not on file   Other Topics Concern  . 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 Father Deceased at age 29   MI, DM  . Mother Alive   HTN  . Brother Deceased   liver problems  . Brother Alive   DM  . Sister Alive   HTN    ROS:  A complete 10 system review of systems was obtained and was unremarkable apart from what is mentioned above.  PHYSICAL EXAMINATION:    VITALS:   Vitals:   07/18/16 1523  BP: 116/80  Pulse: 62  Weight: 243 lb (110.2 kg)  Height: 5\' 11"  (1.803 m)   Wt Readings from Last 3 Encounters:  07/18/16 243 lb (110.2 kg)  04/27/16 234 lb (106.1 kg)  03/14/16 230 lb (104.3 kg)     GEN:  The patient appears stated age and is in NAD. HEENT:  Normocephalic, atraumatic.  The mucous membranes are moist. The superficial temporal arteries are without ropiness or tenderness.Pooling of saliva about the corner of the mouth is noted. CV:  RRR With ectopic beats Lungs:  CTAB Neck/HEME:  There are no carotid bruits bilaterally.  Neurological examination:  Orientation: The patient is alert and oriented x3. Fund of knowledge is appropriate.  Recent and remote memory are intact.  Attention and concentration are normal.    Able to name objects and repeat phrases. Cranial nerves: There is good facial symmetry. There is facial hypomimia.   The speech is fluent and clear. Minimal trouble with gutteral sounds.  Soft palate rises symmetrically and there is no tongue deviation. Hearing is intact to conversational tone. Sensation: Sensation is intact to light touch throughout. Motor: Strength is 5/5 in the bilateral upper and lower extremities.   Shoulder shrug is equal and symmetric.  There is no pronator  drift.   Movement examination: Tone: There is good tone everywhere Abnormal movements: No resting tremor today Coordination:  There is good rapid alternating movements today. Gait and Station: The patient has no difficulty arising out of a deep-seated chair without the use of the hands. The patient's stride length is normal.  Armswing is decreased but just slightly.  He is able to run down the hall without difficulty.  Lab Results  Component Value Date   TSH 1.51 06/24/2015     Chemistry      Component Value Date/Time   NA 140 04/27/2016 1600   K 3.9 04/27/2016 1600   CL 102 04/27/2016 1600   CO2 30 04/27/2016 1600   BUN 16 04/27/2016 1600   CREATININE 1.10 04/27/2016 1600      Component Value Date/Time   CALCIUM 9.6 04/27/2016 1600   ALKPHOS 31 (L) 04/27/2016 1600   AST 26 04/27/2016 1600   ALT 34 04/27/2016 1600   BILITOT 0.5 04/27/2016 1600     Lab Results  Component Value Date   VITAMINB12 130 (L) 11/09/2015     ASSESSMENT/PLAN:  1.   Parkinson's disease.  The patient has tremor, bradykinesia, mild rigidity.  -Continue Mirapex ER, 1.5 mg daily.  Denies compulsive behaviors.  Denies sleep attacks.  -talked to him about extreme importance of safe, CV exercise.  He has gotten a stationary/recumbent bike but has stopped using it.  Encouraged him to start that. 2.  EtOH overuse  -talked to him once again about the importance of decreasing and subsequently discontinuing his alcohol.  He is drinking excessively. 3.  B12 deficiency  -noncompliant with regular injections  -B12 injection given today.  Decided following this to start with  oral supplements as hard for him to consistently get to the office.  Will recheck in future.  Take 1011mcg daily 4.  Cervical spinal stenosis with syringomyelia  -Needs further investigation but patient not ready to do this.  MRI last done in 2014 and it was recommended then to do contrasted scan and do MRI thoracic scan to rule out  structural lesion.   5.  F/u 4 months, sooner should new neuro issues arise.  Much greater than 50% of this visit was spent in counseling with the patient and the family.  Total face to face time:  25 min

## 2016-07-18 ENCOUNTER — Other Ambulatory Visit: Payer: Self-pay | Admitting: Internal Medicine

## 2016-07-18 ENCOUNTER — Ambulatory Visit (INDEPENDENT_AMBULATORY_CARE_PROVIDER_SITE_OTHER): Payer: 59 | Admitting: Neurology

## 2016-07-18 ENCOUNTER — Encounter: Payer: Self-pay | Admitting: Neurology

## 2016-07-18 ENCOUNTER — Telehealth: Payer: Self-pay

## 2016-07-18 VITALS — BP 116/80 | HR 62 | Ht 71.0 in | Wt 243.0 lb

## 2016-07-18 DIAGNOSIS — F1099 Alcohol use, unspecified with unspecified alcohol-induced disorder: Secondary | ICD-10-CM | POA: Diagnosis not present

## 2016-07-18 DIAGNOSIS — E538 Deficiency of other specified B group vitamins: Secondary | ICD-10-CM

## 2016-07-18 DIAGNOSIS — IMO0002 Reserved for concepts with insufficient information to code with codable children: Secondary | ICD-10-CM

## 2016-07-18 MED ORDER — CYANOCOBALAMIN 1000 MCG/ML IJ SOLN
1000.0000 ug | Freq: Once | INTRAMUSCULAR | Status: AC
  Administered 2016-07-18: 1000 ug via INTRAMUSCULAR

## 2016-07-18 NOTE — Telephone Encounter (Signed)
Pt left v/m requesting refill Matzim LA to [facility]; refill already done; left v/m for pt to ck with pharmacy.

## 2016-07-18 NOTE — Patient Instructions (Signed)
1. Start B12 1000 mcg daily.

## 2016-08-29 ENCOUNTER — Other Ambulatory Visit: Payer: Self-pay | Admitting: Neurology

## 2016-09-13 ENCOUNTER — Telehealth: Payer: Self-pay | Admitting: Neurology

## 2016-09-13 MED ORDER — PRAMIPEXOLE DIHYDROCHLORIDE 0.5 MG PO TABS: 0.5000 mg | ORAL_TABLET | Freq: Three times a day (TID) | ORAL | 0 refills | Status: DC

## 2016-09-13 NOTE — Telephone Encounter (Signed)
No alternative with once daily dosing if mirapex costs that much.  Will have to take 0.5 mg pramipexole tid.

## 2016-09-13 NOTE — Telephone Encounter (Signed)
Patient made aware we can send in TID dosing. RX sent to pharmacy and he will compare pricing and let us know what he wants to do.

## 2016-09-13 NOTE — Telephone Encounter (Signed)
Spoke with patient and offered TID dosing of Pramipexole. He wants to know if alternative with once daily dosing. Please advise.

## 2016-09-13 NOTE — Telephone Encounter (Signed)
Received a call from Patient regarding medication: Pramipexole.  Patient needs a refill of medication.   Patient having side effects from medication. No  Patient calling to update [place] on medication. He was told since his insurance has changed  to [facility] it will cost him almost $300. He would like to know of any other medications available. Thanks

## 2016-09-14 ENCOUNTER — Telehealth: Payer: Self-pay | Admitting: Neurology

## 2016-09-14 NOTE — Telephone Encounter (Signed)
Patient made aware he has to take medication TID because its immediate release not extended.

## 2016-09-14 NOTE — Telephone Encounter (Signed)
Received a call from PT regarding medication: Pramipexole.  Patient needs a refill of medication. No  Patient having side effects from medication. No  Patient calling to update [place] on medication. PT wants to know if he can take it all at once or does he need to take it 1 tablet 3 times a day

## 2016-10-24 ENCOUNTER — Other Ambulatory Visit: Payer: Self-pay | Admitting: Neurology

## 2016-11-14 NOTE — Progress Notes (Signed)
[name] [name] was seen today in the movement disorders clinic for neurologic consultation at the request of [name], MD.  The consultation is for the evaluation of R hand tremor x 1 year.  The records that were made available to me were reviewed.  Pt also brought in records/films from [name].  Pt states that he initially thought that tremor was coming from a problem in his cervical spine.  He was having neck and R arm pain and went to [facility].  I did review an MRI of his cervical spine that he brought me, dated 02/13/2013.  There was a moderate to large disc protrusion at the C6-C7 level which compressed the right C7 nerve root.  There was no associated spinal cord signal change at that level.  There was evidence of a syringomyelia but no cord expansion.  It was recommended that the patient undergo a repeat contrasted examination as well as an MRI of the thoracic spine.  I do not see that this was completed, at least on the images that he brought me and the images that are available through Epic.  He reports that it was not done.  He had injections and the pain got better for about 6 months but came back but he didn't follow back up.    07/29/15 update: The patient follows up today regarding his new diagnosis of Parkinson's disease, accompanied by his wife who supplements the history.  He did not want to start on any new medications last visit.  He has been about the same except that he has noted some occasional tremor in the left upper extremity which is new.  No falls.  No hallucinations.  No lightheadedness or near syncope.  He did have an MRI of the brain without gadolinium on 06/30/2015.  I reviewed this and it was negative.  He was supposed to have an MRI of the cervical and thoracic spine.  This was primarily supposed to be a follow-up from 2014, at which point he had cervical spinal stenosis with syringomyelia and it was recommended at that time that he have a thoracic scan to rule  out a structural lesion.  He did not have it done then, so I ordered at last visit, but he he initially needed sedation.  I prescribed him Valium, but he did not go back and have the scan done.    11/09/15 update:  The patient follows up today.  Last visit, I started the patient on Mirapex ER, 1.5 mg daily.  The patient states that it has definitely helped the tremor.  He denies compulsive behaviors.  No sleep attacks.  No falls but isn't as "sure footed" as was in the past.  No hallucinations.  No lightheadedness or near syncope.  He was supposed to have an MRI of the thoracic spine to follow-up on the syrinx, but still has not done that.  States that he was having pain around rib cage that he can't describe and the right was worse than the left.  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  Little better in last 3 days.  Been on vytorin for few years.  Having voice trail off at end of sentences.  Notices some drooling.  Is planning on getting exercise bike.    Admits to drinking 1/3 bottle of vodka per day  03/14/16 update:  Pt f/u today.  On mirapex 1.5  mg daily.  Denies compulsive behaviors (admits to excessive EtOH but this was prior to mirapex).  He does state that he has dropped his alcohol some, but despite urging, he is unable to tell me how much he is drinking.  Has hx of syringomyelia but has refused to go to follow up on that despite urging.  Has a very low B12 level of 130.  Does request his B12 injection today.  He bought himself a bike but hasn't done it in a while.    07/18/16 update:  Patient remains on pramipexole ER, 1.5 mg daily.  No falls; occasional loss of balance.  Has some visual distortions but no hallucinations.  States that he has been stable on this medication.  Does have B12 deficiency.  Has not been faithful with coming for B12 injections.  Last B12 injection was 04/14/2016.  When asked about how much EtOH he is  drinking he states "I'm still drinking too much."  States that his work is very physical (walking up and down stairs frequently) and that is the majority of exercise that he gets.  11/21/16 update:  Patient seen today in follow-up.  Patient wanted to change from ER to mirapex 0.5 mg tid but hasn't quite finished the ER yet.  Once he does he will change.  Pt denies falls.  Pt denies lightheadedness, near syncope.  No hallucinations but some visual distortions.  Mood has been good.  Changed to oral b12 supplements due to lack of compliance with injections.  Having pain in the low back.  No radiation down the leg.  Worse when riding the golf cart than when playing golf or walking.  States that trying to decrease EtOH but still at 1/3 bottle vodka per day.  Neuroimaging of the brain has previously been performed.  It was apparently done years ago for headache.   ALLERGIES:   Allergies  Allergen Reactions  . Hydrocodone Nausea Only    CURRENT MEDICATIONS:  Outpatient Encounter Prescriptions as of 11/21/2016  Medication Sig  . aspirin EC 81 MG tablet Take 81 mg by mouth daily.  Marland Kitchen ezetimibe-simvastatin (VYTORIN) 10-20 MG tablet TAKE 1 TABLET BY MOUTH AT BEDTIME.  Marland Kitchen KLOR-CON M10 10 MEQ tablet TAKE 1 TABLET BY MOUTH TWICE DAILY.  Marland Kitchen losartan-hydrochlorothiazide (HYZAAR) 100-25 MG tablet TAKE 1 TABLET BY MOUTH DAILY.  Marland Kitchen MATZIM LA 360 MG 24 hr tablet TAKE 1 TABLET BY MOUTH DAILY.  . Pramipexole Dihydrochloride 1.5 MG TB24 TAKE 1 TABLET (1.5 MG TOTAL) BY MOUTH DAILY.  . vitamin B-12 (CYANOCOBALAMIN) 1000 MCG tablet Take 1,000 mcg by mouth daily.  . [DISCONTINUED] pramipexole (MIRAPEX) 0.5 MG tablet TAKE 1 TABLET (0.5 MG TOTAL) BY MOUTH 3 (THREE) TIMES DAILY.   No facility-administered encounter medications on file as of 11/21/2016.     PAST MEDICAL HISTORY:   Past Medical History:  Diagnosis Date  . Cancer ([place])    skin, basal cell face and abdomen  . Herniated disc, cervical   . Hyperlipidemia     . Hypertension     PAST SURGICAL HISTORY:   Past Surgical History:  Procedure Laterality Date  . BASAL CELL CARCINOMA EXCISION    . EYE SURGERY     as a child  . FINGER FRACTURE SURGERY     5th digit, left hand, x2  . HERNIA REPAIR     age 28    SOCIAL HISTORY:   Social History   Social History  . Marital status: Married  Spouse name: N/A  . Number of children: 1  . Years of education: N/A   Occupational History  . 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 Smoking status: Never Smoker  . Smokeless tobacco: Former Systems developer     Comment: Dip Only.  . Alcohol use 0.0 oz/week     Comment: 3-4 drinks a night  . Drug use: No  . Sexual activity: Not on file   Other Topics Concern  . 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 Father Deceased at age 81       MI, DM  . Mother Alive       HTN  . Brother Deceased       liver problems  . Brother Alive       DM  . Sister Alive       HTN    ROS:  A complete 10 system review of systems was obtained and was unremarkable apart from what is mentioned above.  PHYSICAL EXAMINATION:    VITALS:   Vitals:   11/21/16 1523  BP: 124/80  Pulse: (!) 58  SpO2: 94%  Weight: 234 lb (106.1 kg)  Height: 5\' 11"  (1.803 m)   Wt Readings from Last 3 Encounters:  11/21/16 234 lb (106.1 kg)  07/18/16 243 lb (110.2 kg)  04/27/16 234 lb (106.1 kg)     GEN:  The patient appears stated age and is in NAD. HEENT:  Normocephalic, atraumatic.  The mucous membranes are moist. The superficial temporal arteries are without ropiness or tenderness.Pooling of saliva about the corner of the mouth is noted. CV:  RRR With ectopic beats Lungs:   CTAB Neck/HEME:  There are no carotid bruits bilaterally.  Neurological examination:  Orientation: The patient is alert and oriented x3. Fund of knowledge is appropriate.  Recent and remote memory are intact.  Attention and concentration are normal.    Able to name objects and repeat phrases. Cranial nerves: There is good facial symmetry. There is facial hypomimia.   The speech is fluent and clear. Minimal trouble with gutteral sounds.  Soft palate rises symmetrically and there is no tongue deviation. Hearing is intact to conversational tone. Sensation: Sensation is intact to light touch throughout. Motor: Strength is 5/5 in the bilateral upper and lower extremities.   Shoulder shrug is equal and symmetric.  There is no pronator drift.   Movement examination: Tone: There is just slight increased tone in the RUE Abnormal movements: Occasional tremor in the RUE and more rare in the LUE Coordination:  There is good rapid alternating movements today. Gait and Station: The patient has no difficulty arising out of a deep-seated chair without the use of the hands. The patient's stride length is normal.  Armswing is decreased but just slightly.   Lab Results  Component Value Date   TSH 1.51 06/24/2015     Chemistry      Component Value Date/Time   NA 140 04/27/2016 1600   K 3.9 04/27/2016 1600   CL 102 04/27/2016 1600   CO2 30 04/27/2016 1600   BUN 16 04/27/2016 1600   CREATININE 1.10 04/27/2016 1600  Component Value Date/Time   CALCIUM 9.6 04/27/2016 1600   ALKPHOS 31 (L) 04/27/2016 1600   AST 26 04/27/2016 1600   ALT 34 04/27/2016 1600   BILITOT 0.5 04/27/2016 1600     Lab Results  Component Value Date   VITAMINB12 130 (L) 11/09/2015     ASSESSMENT/PLAN:  1.   Parkinson's disease.  The patient has tremor, bradykinesia, mild rigidity.  -Continue Mirapex ER, 1.5 mg daily.  Once this RX runs out will change to pramipexole 0.5 mg tid due to cost.   Denies compulsive  behaviors.  Denies sleep attacks.  -talked to him about extreme importance of safe, CV exercise.  He has gotten a stationary/recumbent bike but has stopped using it.  Encouraged him to start that. 2.  EtOH overuse  -talked to him once again about the importance of decreasing and subsequently discontinuing his alcohol.  3.  B12 deficiency  -on oral supplements.  Take 1066mcg daily  -recheck b12 today 4.  Cervical spinal stenosis with syringomyelia  -Needs further investigation but patient not ready to do this.  MRI last done in 2014 and it was recommended then to do contrasted scan and do MRI thoracic scan to rule out structural lesion.  Discussed at length again today and described consequences. He understood but doesn't want to proceed. 5.  LBP  -refused neuroimaging.  Is going to think about this given back pain. 6.  F/u 4 months, sooner should new neuro issues arise.  Much greater than 50% of this visit was spent in counseling with the patient and the family.  Total face to face time:  25 min

## 2016-11-21 ENCOUNTER — Encounter: Payer: Self-pay | Admitting: Neurology

## 2016-11-21 ENCOUNTER — Other Ambulatory Visit (INDEPENDENT_AMBULATORY_CARE_PROVIDER_SITE_OTHER): Payer: Self-pay

## 2016-11-21 ENCOUNTER — Ambulatory Visit (INDEPENDENT_AMBULATORY_CARE_PROVIDER_SITE_OTHER): Payer: Self-pay | Admitting: Neurology

## 2016-11-21 VITALS — BP 124/80 | HR 58 | Ht 71.0 in | Wt 234.0 lb

## 2016-11-21 DIAGNOSIS — E538 Deficiency of other specified B group vitamins: Secondary | ICD-10-CM

## 2016-11-21 DIAGNOSIS — IMO0002 Reserved for concepts with insufficient information to code with codable children: Secondary | ICD-10-CM

## 2016-11-21 DIAGNOSIS — F1099 Alcohol use, unspecified with unspecified alcohol-induced disorder: Secondary | ICD-10-CM

## 2016-11-21 DIAGNOSIS — M545 Low back pain, unspecified: Secondary | ICD-10-CM

## 2016-11-21 DIAGNOSIS — G20 Parkinson's disease: Secondary | ICD-10-CM

## 2016-11-21 DIAGNOSIS — G950 Syringomyelia and syringobulbia: Secondary | ICD-10-CM

## 2016-11-21 LAB — VITAMIN B12: VITAMIN B 12: 658 pg/mL (ref 211–911)

## 2016-11-22 ENCOUNTER — Telehealth: Payer: Self-pay | Admitting: Neurology

## 2016-11-22 NOTE — Telephone Encounter (Signed)
-----   Message from Parker, DO sent at 11/21/2016  5:56 PM EDT ----- Let pt know that B12 is much better and to continue the oral supplement

## 2016-11-22 NOTE — Telephone Encounter (Signed)
Mychart message sent to patient.

## 2016-12-30 ENCOUNTER — Other Ambulatory Visit: Payer: Self-pay | Admitting: Neurology

## 2017-03-07 ENCOUNTER — Other Ambulatory Visit: Payer: Self-pay | Admitting: Neurology

## 2017-03-22 NOTE — Progress Notes (Signed)
[name] [name] was seen today in the movement disorders clinic for neurologic consultation at the request of [name], MD.  The consultation is for the evaluation of R hand tremor x 1 year.  The records that were made available to me were reviewed.  Pt also brought in records/films from [name].  Pt states that he initially thought that tremor was coming from a problem in his cervical spine.  He was having neck and R arm pain and went to [facility].  I did review an MRI of his cervical spine that he brought me, dated 02/13/2013.  There was a moderate to large disc protrusion at the C6-C7 level which compressed the right C7 nerve root.  There was no associated spinal cord signal change at that level.  There was evidence of a syringomyelia but no cord expansion.  It was recommended that the patient undergo a repeat contrasted examination as well as an MRI of the thoracic spine.  I do not see that this was completed, at least on the images that he brought me and the images that are available through Epic.  He reports that it was not done.  He had injections and the pain got better for about 6 months but came back but he didn't follow back up.    07/29/15 update: The patient follows up today regarding his new diagnosis of Parkinson's disease, accompanied by his wife who supplements the history.  He did not want to start on any new medications last visit.  He has been about the same except that he has noted some occasional tremor in the left upper extremity which is new.  No falls.  No hallucinations.  No lightheadedness or near syncope.  He did have an MRI of the brain without gadolinium on 06/30/2015.  I reviewed this and it was negative.  He was supposed to have an MRI of the cervical and thoracic spine.  This was primarily supposed to be a follow-up from 2014, at which point he had cervical spinal stenosis with syringomyelia and it was recommended at that time that he have a thoracic scan to rule  out a structural lesion.  He did not have it done then, so I ordered at last visit, but he he initially needed sedation.  I prescribed him Valium, but he did not go back and have the scan done.    11/09/15 update:  The patient follows up today.  Last visit, I started the patient on Mirapex ER, 1.5 mg daily.  The patient states that it has definitely helped the tremor.  He denies compulsive behaviors.  No sleep attacks.  No falls but isn't as "sure footed" as was in the past.  No hallucinations.  No lightheadedness or near syncope.  He was supposed to have an MRI of the thoracic spine to follow-up on the syrinx, but still has not done that.  States that he was having pain around rib cage that he can't describe and the right was worse than the left.  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  Little better in last 3 days.  Been on vytorin for few years.  Having voice trail off at end of sentences.  Notices some drooling.  Is planning on getting exercise bike.    Admits to drinking 1/3 bottle of vodka per day  03/14/16 update:  Pt f/u today.  On mirapex 1.5  mg daily.  Denies compulsive behaviors (admits to excessive EtOH but this was prior to mirapex).  He does state that he has dropped his alcohol some, but despite urging, he is unable to tell me how much he is drinking.  Has hx of syringomyelia but has refused to go to follow up on that despite urging.  Has a very low B12 level of 130.  Does request his B12 injection today.  He bought himself a bike but hasn't done it in a while.    07/18/16 update:  Patient remains on pramipexole ER, 1.5 mg daily.  No falls; occasional loss of balance.  Has some visual distortions but no hallucinations.  States that he has been stable on this medication.  Does have B12 deficiency.  Has not been faithful with coming for B12 injections.  Last B12 injection was 04/14/2016.  When asked about how much EtOH he is  drinking he states "I'm still drinking too much."  States that his work is very physical (walking up and down stairs frequently) and that is the majority of exercise that he gets.  11/21/16 update:  Patient seen today in follow-up.  Patient wanted to change from ER to mirapex 0.5 mg tid but hasn't quite finished the ER yet.  Once he does he will change.  Pt denies falls.  Pt denies lightheadedness, near syncope.  No hallucinations but some visual distortions.  Mood has been good.  Changed to oral b12 supplements due to lack of compliance with injections.  Having pain in the low back.  No radiation down the leg.  Worse when riding the golf cart than when playing golf or walking.  States that trying to decrease EtOH but still at 1/3 bottle vodka per day.  03/23/17 update: Patient seen today in follow-up for his Parkinson's disease.  Patient is on pramipexole 0.5 mg 3 times per day.  No compulsive behavior.  Occasional lightheadedness but no near syncope.  No sleep attacks.  No falls.  No hallucinations.  His mood is good.  In regards to alcohol, he states that "I'm drinking less than what I was."  He refuses to answer the question even when asked multiple times.    No new medical problems.  On 1069mcg b12 oral.  Neuroimaging of the brain has previously been performed.  It was apparently done years ago for headache.   ALLERGIES:   Allergies  Allergen Reactions  . Hydrocodone Nausea Only    CURRENT MEDICATIONS:  Outpatient Encounter Prescriptions as of 03/23/2017  Medication Sig  . aspirin EC 81 MG tablet Take 81 mg by mouth daily.  Marland Kitchen ezetimibe-simvastatin (VYTORIN) 10-20 MG tablet TAKE 1 TABLET BY MOUTH AT BEDTIME.  Marland Kitchen KLOR-CON M10 10 MEQ tablet TAKE 1 TABLET BY MOUTH TWICE DAILY.  Marland Kitchen losartan-hydrochlorothiazide (HYZAAR) 100-25 MG tablet TAKE 1 TABLET BY MOUTH DAILY.  Marland Kitchen MATZIM LA 360 MG 24 hr tablet TAKE 1 TABLET BY MOUTH DAILY.  Marland Kitchen pramipexole (MIRAPEX) 0.5 MG tablet TAKE 1 TABLET (0.5 MG TOTAL) BY  MOUTH 3 (THREE) TIMES DAILY.  . vitamin B-12 (CYANOCOBALAMIN) 1000 MCG tablet Take 1,000 mcg by mouth daily.  . [DISCONTINUED] Pramipexole Dihydrochloride 1.5 MG TB24 TAKE 1 TABLET (1.5 MG TOTAL) BY MOUTH DAILY.   No facility-administered encounter medications on file as of 03/23/2017.     PAST MEDICAL HISTORY:   Past Medical History:  Diagnosis Date  . Cancer ([place])    skin, basal cell face and abdomen  . Herniated disc, cervical   .  Hyperlipidemia   . Hypertension     PAST SURGICAL HISTORY:   Past Surgical History:  Procedure Laterality Date  . BASAL CELL CARCINOMA EXCISION    . EYE SURGERY     as a child  . FINGER FRACTURE SURGERY     5th digit, left hand, x2  . HERNIA REPAIR     age 72    SOCIAL HISTORY:   Social History   Social History  . Marital status: Married    Spouse name: N/A  . Number of children: 1  . Years of education: N/A   Occupational History  . 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 Smoking status: Never Smoker  . Smokeless tobacco: Former Systems developer     Comment: Dip Only.  . Alcohol use 0.0 oz/week     Comment: 3-4 drinks a night  . Drug use: No  . Sexual activity: Not on file   Other Topics Concern  . 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 Father Deceased at age 42       MI, DM  . Mother Alive       HTN  . Brother Deceased       liver problems  . Brother Alive       DM  . Sister Alive       HTN    ROS:  A complete 10 system review of systems was obtained and was unremarkable apart from what is mentioned above.  PHYSICAL EXAMINATION:    VITALS:   Vitals:   03/23/17 1507  BP: 130/82  Pulse: (!) 54  SpO2: 98%  Weight: 241 lb  (109.3 kg)  Height: 5\' 11"  (1.803 m)   Wt Readings from Last 3 Encounters:  03/23/17 241 lb (109.3 kg)  11/21/16 234 lb (106.1 kg)  07/18/16 243 lb (110.2 kg)     GEN:  The patient appears stated age and is in NAD. HEENT:  Normocephalic, atraumatic.  The mucous membranes are moist. The superficial temporal arteries are without ropiness or tenderness.Pooling of saliva about the corner of the mouth is noted. CV:  RRR With ectopic beats Lungs:  CTAB Neck/HEME:  There are no carotid bruits bilaterally.  Neurological examination:  Orientation: The patient is alert and oriented x3. Fund of knowledge is appropriate.  Recent and remote memory are intact.  Attention and concentration are normal.    Able to name objects and repeat phrases. Cranial nerves: There is good facial symmetry. There is facial hypomimia.   The speech is fluent and clear. Minimal trouble with gutteral sounds.  Soft palate rises symmetrically and there is no tongue deviation. Hearing is intact to conversational tone. Sensation: Sensation is intact to light touch throughout. Motor: Strength is 5/5 in the bilateral upper and lower extremities.   Shoulder shrug is equal and symmetric.  There is no pronator drift.   Movement examination: Tone: There is just slight increased tone in the RUE Abnormal movements: Occasional tremor in the RUE Coordination:  There is mild decremation with  alternation of supination/pronation of the forearm on the right only.  All other RAMs are normal Gait and Station: The patient has  no difficulty arising out of a deep-seated chair without the use of the hands. The patient's stride length is normal.  Armswing is decreased but just slightly.   Lab Results  Component Value Date   TSH 1.51 06/24/2015     Chemistry      Component Value Date/Time   NA 140 04/27/2016 1600   K 3.9 04/27/2016 1600   CL 102 04/27/2016 1600   CO2 30 04/27/2016 1600   BUN 16 04/27/2016 1600   CREATININE 1.10  04/27/2016 1600      Component Value Date/Time   CALCIUM 9.6 04/27/2016 1600   ALKPHOS 31 (L) 04/27/2016 1600   AST 26 04/27/2016 1600   ALT 34 04/27/2016 1600   BILITOT 0.5 04/27/2016 1600     Lab Results  Component Value Date   OHYWVPXT06 269 11/21/2016     ASSESSMENT/PLAN:  1.   Parkinson's disease.  The patient has tremor, bradykinesia, mild rigidity.  -pramipexole, 0.5 mg tid.  Risks, benefits, side effects and alternative therapies were discussed.  The opportunity to ask questions was given and they were answered to the best of my ability.  The patient expressed understanding and willingness to follow the outlined treatment protocols.  -talked to him about extreme importance of safe, CV exercise.  He has gotten a stationary/recumbent bike but has stopped using it.  Encouraged him to start that.  -asks me about DBS surgery.  We discussed details today.  He has not been on levodopa.   2.  EtOH overuse  -talked to him once again about the importance of decreasing and subsequently discontinuing his alcohol.  3.  B12 deficiency  -on oral supplements.  Is taking 1059mcg daily 4.  Cervical spinal stenosis with syringomyelia  -denies significant neck pain  -Needs further investigation but patient not ready to do this.  MRI last done in 2014 and it was recommended then to do contrasted scan and do MRI thoracic scan to rule out structural lesion.  Discussed at length again today and described consequences. He understood but doesn't want to proceed. 5.  LBP  -refused neuroimaging.  Is going to think about this given back pain. 6.  Follow up is anticipated in the next few months, sooner should new neurologic issues arise.  Much greater than 50% of this visit was spent in counseling and coordinating care.  Total face to face time:  25 min

## 2017-03-23 ENCOUNTER — Ambulatory Visit (INDEPENDENT_AMBULATORY_CARE_PROVIDER_SITE_OTHER): Payer: 59 | Admitting: Neurology

## 2017-03-23 ENCOUNTER — Encounter: Payer: Self-pay | Admitting: Neurology

## 2017-03-23 VITALS — BP 130/82 | HR 54 | Ht 71.0 in | Wt 241.0 lb

## 2017-03-23 DIAGNOSIS — G20 Parkinson's disease: Secondary | ICD-10-CM

## 2017-03-23 DIAGNOSIS — E538 Deficiency of other specified B group vitamins: Secondary | ICD-10-CM

## 2017-04-03 DIAGNOSIS — J22 Unspecified acute lower respiratory infection: Secondary | ICD-10-CM | POA: Diagnosis not present

## 2017-04-05 ENCOUNTER — Other Ambulatory Visit: Payer: Self-pay | Admitting: Neurology

## 2017-04-28 ENCOUNTER — Other Ambulatory Visit: Payer: Self-pay | Admitting: Internal Medicine

## 2017-05-03 ENCOUNTER — Encounter: Payer: BLUE CROSS/BLUE SHIELD | Admitting: Internal Medicine

## 2017-05-03 ENCOUNTER — Telehealth: Payer: Self-pay

## 2017-05-03 NOTE — Telephone Encounter (Signed)
Copied from [place]. Topic: Quick Communication - Appointment Cancellation >> May 03, 2017  2:50 PM Ether Griffins B wrote: Patient called to cancel appointment scheduled for 05/03/17. Patient has rescheduled their appointment.    Route to department's PEC pool.

## 2017-05-03 NOTE — Telephone Encounter (Signed)
I am generally not in favor of the $50 charge but want to be consistent in the group, etc I will leave it up to you Maybe we can waive it once, and charge anytime after that?

## 2017-05-03 NOTE — Telephone Encounter (Signed)
Pt cancelled appt 05/03/17 at 2:48pm; pts appt time was 05/03/17 at 3 PM. Pt could not leave work; Do you want to charge a late cancellation fee. Pt rescheduled CPX on 05/17/17.

## 2017-05-09 NOTE — Telephone Encounter (Signed)
Ok with me to waive.the fee.

## 2017-05-17 ENCOUNTER — Encounter: Payer: 59 | Admitting: Internal Medicine

## 2017-06-04 ENCOUNTER — Other Ambulatory Visit: Payer: Self-pay | Admitting: Internal Medicine

## 2017-07-20 ENCOUNTER — Other Ambulatory Visit: Payer: Self-pay | Admitting: Internal Medicine

## 2017-08-09 ENCOUNTER — Ambulatory Visit (INDEPENDENT_AMBULATORY_CARE_PROVIDER_SITE_OTHER): Payer: 59 | Admitting: Internal Medicine

## 2017-08-09 ENCOUNTER — Encounter: Payer: Self-pay | Admitting: Internal Medicine

## 2017-08-09 VITALS — BP 122/78 | HR 68 | Temp 98.3°F | Ht 69.0 in | Wt 220.0 lb

## 2017-08-09 DIAGNOSIS — Z0000 Encounter for general adult medical examination without abnormal findings: Secondary | ICD-10-CM | POA: Diagnosis not present

## 2017-08-09 DIAGNOSIS — Z1211 Encounter for screening for malignant neoplasm of colon: Secondary | ICD-10-CM

## 2017-08-09 DIAGNOSIS — I10 Essential (primary) hypertension: Secondary | ICD-10-CM

## 2017-08-09 DIAGNOSIS — E785 Hyperlipidemia, unspecified: Secondary | ICD-10-CM

## 2017-08-09 DIAGNOSIS — Z125 Encounter for screening for malignant neoplasm of prostate: Secondary | ICD-10-CM

## 2017-08-09 DIAGNOSIS — G20 Parkinson's disease: Secondary | ICD-10-CM

## 2017-08-09 LAB — COMPREHENSIVE METABOLIC PANEL
ALT: 27 U/L (ref 0–53)
AST: 22 U/L (ref 0–37)
Albumin: 4.9 g/dL (ref 3.5–5.2)
Alkaline Phosphatase: 31 U/L — ABNORMAL LOW (ref 39–117)
BUN: 14 mg/dL (ref 6–23)
CO2: 34 meq/L — AB (ref 19–32)
CREATININE: 0.99 mg/dL (ref 0.40–1.50)
Calcium: 10 mg/dL (ref 8.4–10.5)
Chloride: 98 mEq/L (ref 96–112)
GFR: 82.38 mL/min (ref >60.00)
GLUCOSE: 94 mg/dL (ref 70–99)
POTASSIUM: 3.6 meq/L (ref 3.5–5.1)
Sodium: 137 mEq/L (ref 135–145)
Total Bilirubin: 0.8 mg/dL (ref 0.2–1.2)
Total Protein: 7.4 g/dL (ref 6.0–8.3)

## 2017-08-09 LAB — LIPID PANEL
CHOL/HDL RATIO: 2
CHOLESTEROL: 127 mg/dL (ref 0–200)
HDL: 52.1 mg/dL (ref >39.00)
LDL CALC: 56 mg/dL (ref 0–99)
NONHDL: 74.67
Triglycerides: 93 mg/dL (ref 0.0–149.0)
VLDL: 18.6 mg/dL (ref 0.0–40.0)

## 2017-08-09 LAB — CBC
HEMATOCRIT: 41.7 % (ref 39.0–52.0)
HEMOGLOBIN: 14.8 g/dL (ref 13.0–17.0)
MCHC: 35.4 g/dL (ref 30.0–36.0)
MCV: 94.3 fl (ref 78.0–100.0)
Platelets: 190 10*3/uL (ref 150.0–400.0)
RBC: 4.42 Mil/uL (ref 4.22–5.81)
RDW: 12.5 % (ref 11.5–15.5)
WBC: 5.6 10*3/uL (ref 4.0–10.5)

## 2017-08-09 LAB — PSA: PSA: 0.62 ng/mL (ref 0.10–4.00)

## 2017-08-09 NOTE — Assessment & Plan Note (Signed)
No problems with primary prevention Okay to try med holiday to see if related to vague abdominal wall pain--but I doubt it

## 2017-08-09 NOTE — Assessment & Plan Note (Signed)
BP Readings from Last 3 Encounters:  08/09/17 122/78  03/23/17 130/82  11/21/16 124/80   Good control Will check labs

## 2017-08-09 NOTE — Progress Notes (Signed)
Subjective:    Patient ID: [name] [name], male    DOB: Jun 05, 1960, 57 y.o.   MRN: [id number]  HPI Here for physical  Keeps up with Dr Tat for the Parkinson's Mild progression Has had to change to the short acting mirapex---hasn't given up anything Mostly right hand and jaw (and he is right handed)  No problems with BP meds Some abdominal pains--thinks it may be muscular Has improved diet and walking a lot---got weight back down again  Current Outpatient Medications on File Prior to Visit  Medication Sig Dispense Refill  . aspirin EC 81 MG tablet Take 81 mg by mouth daily.    Marland Kitchen ezetimibe-simvastatin (VYTORIN) 10-20 MG tablet TAKE 1 TABLET BY MOUTH AT BEDTIME. 90 tablet 0  . KLOR-CON M10 10 MEQ tablet TAKE 1 TABLET BY MOUTH TWICE DAILY. 60 tablet 11  . losartan-hydrochlorothiazide (HYZAAR) 100-25 MG tablet TAKE 1 TABLET BY MOUTH DAILY. 90 tablet 0  . MATZIM LA 360 MG 24 hr tablet TAKE 1 TABLET BY MOUTH DAILY. 90 tablet 3  . pramipexole (MIRAPEX) 0.5 MG tablet TAKE 1 TABLET (0.5 MG TOTAL) BY MOUTH 3 (THREE) TIMES DAILY. 270 tablet 1  . vitamin B-12 (CYANOCOBALAMIN) 1000 MCG tablet Take 1,000 mcg by mouth daily.    . [DISCONTINUED] potassium chloride (K-DUR) 10 MEQ tablet Take 1 tablet (10 mEq total) by mouth 2 (two) times daily. 60 tablet 2   No current facility-administered medications on file prior to visit.     Allergies  Allergen Reactions  . Hydrocodone Nausea Only    Past Medical History:  Diagnosis Date  . Cancer ([place])    skin, basal cell face and abdomen  . Herniated disc, cervical   . Hyperlipidemia   . Hypertension     Past Surgical History:  Procedure Laterality Date  . BASAL CELL CARCINOMA EXCISION    . EYE SURGERY     as a child  . FINGER FRACTURE SURGERY     5th digit, left hand, x2  . HERNIA REPAIR     age 57    Family History  Problem Relation Age of Onset  . Diabetes Father   . Hypertension Father   . Heart attack Father   . Hypertension  Mother     Social History   Socioeconomic History  . Marital status: Married    Spouse name: Not on file  . Number of children: 1  . Years of education: Not on file  . Highest education level: Not on file  Social Needs  . Financial resource strain: Not on file  . Food insecurity - worry: Not on file  . Food insecurity - inability: Not on file  . Transportation needs - medical: Not on file  . Transportation needs - non-medical: Not on file  Occupational History  . Occupation: Artist  Tobacco Use  . Smoking status: Never Smoker  . Smokeless tobacco: Former Systems developer  . Tobacco comment: Dip Only.  Substance and Sexual Activity  . Alcohol use: Yes    Alcohol/week: 0.0 oz    Comment: 3-4 drinks a night  . Drug use: No  . Sexual activity: Not on file  Other Topics Concern  . 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       Wears seat belt Weight back down over 20#  HENT: Negative for dental problem, hearing loss, tinnitus and trouble swallowing.        Overdue for dentist  Eyes: Negative for visual disturbance.       No diplopia or unilateral vision loss  Respiratory: Negative for cough, chest tightness and shortness of breath.   Cardiovascular: Negative for chest pain, palpitations and leg swelling.  Gastrointestinal: Negative for blood in stool and nausea.       Bowels slower on low carb diet--eating salad helps No blood No heartburn  Genitourinary: Negative for difficulty urinating and urgency.       Some increased frequency but flow okay No sexual problems  Musculoskeletal: Negative for arthralgias, back pain and joint swelling.  Skin: Negative for rash.       No suspicious  lesions---hasn't seen derm in some time  Allergic/Immunologic: Negative for environmental allergies and immunocompromised state.  Neurological: Positive for tremors. Negative for dizziness, syncope and headaches.  Hematological: Negative for adenopathy. Does not bruise/bleed easily.  Psychiatric/Behavioral: Negative for dysphoric mood and sleep disturbance. The patient is not nervous/anxious.        Objective:   Physical Exam  Constitutional: He appears well-developed. No distress.  HENT:  Head: Normocephalic and atraumatic.  Right Ear: External ear normal.  Left Ear: External ear normal.  Mouth/Throat: Oropharynx is clear and moist. No oropharyngeal exudate.  Eyes: Conjunctivae are normal. Pupils are equal, round, and reactive to light.  Neck: No thyromegaly present.  Cardiovascular: Normal rate, regular rhythm, normal heart sounds and intact distal pulses. Exam reveals no gallop.  No murmur heard. Pulmonary/Chest: Effort normal and breath sounds normal. No respiratory distress. He has no wheezes. He has no rales.  Abdominal: Soft. There is no tenderness.  Musculoskeletal: He exhibits no edema or tenderness.  Lymphadenopathy:    He has no cervical adenopathy.  Neurological:  Right tremor Slight masked facies Mild bradykinesia  Skin: No rash noted. No erythema.  Psychiatric: He has a normal mood and affect. His behavior is normal.          Assessment & Plan:

## 2017-08-09 NOTE — Assessment & Plan Note (Signed)
Healthy for the most part Working better on fitness Yearly flu vaccine Promises to finally do the FIT this year Will check PSA after discussion

## 2017-08-09 NOTE — Assessment & Plan Note (Signed)
Slight progression Continues with Dr Carles Collet

## 2017-08-20 NOTE — Progress Notes (Signed)
[name] [name] was seen today in the movement disorders clinic for neurologic consultation at the request of [name] Carbon, MD.  The consultation is for the evaluation of R hand tremor x 1 year.  The records that were made available to me were reviewed.  Pt also brought in records/films from [name].  Pt states that he initially thought that tremor was coming from a problem in his cervical spine.  He was having neck and R arm pain and went to [facility].  I did review an MRI of his cervical spine that he brought me, dated 02/13/2013.  There was a moderate to large disc protrusion at the C6-C7 level which compressed the right C7 nerve root.  There was no associated spinal cord signal change at that level.  There was evidence of a syringomyelia but no cord expansion.  It was recommended that the patient undergo a repeat contrasted examination as well as an MRI of the thoracic spine.  I do not see that this was completed, at least on the images that he brought me and the images that are available through Epic.  He reports that it was not done.  He had injections and the pain got better for about 6 months but came back but he didn't follow back up.    07/29/15 update: The patient follows up today regarding his new diagnosis of Parkinson's disease, accompanied by his wife who supplements the history.  He did not want to start on any new medications last visit.  He has been about the same except that he has noted some occasional tremor in the left upper extremity which is new.  No falls.  No hallucinations.  No lightheadedness or near syncope.  He did have an MRI of the brain without gadolinium on 06/30/2015.  I reviewed this and it was negative.  He was supposed to have an MRI of the cervical and thoracic spine.  This was primarily supposed to be a follow-up from 2014, at which point he had cervical spinal stenosis with syringomyelia and it was recommended at that time that he have a thoracic scan to rule  out a structural lesion.  He did not have it done then, so I ordered at last visit, but he he initially needed sedation.  I prescribed him Valium, but he did not go back and have the scan done.    11/09/15 update:  The patient follows up today.  Last visit, I started the patient on Mirapex ER, 1.5 mg daily.  The patient states that it has definitely helped the tremor.  He denies compulsive behaviors.  No sleep attacks.  No falls but isn't as "sure footed" as was in the past.  No hallucinations.  No lightheadedness or near syncope.  He was supposed to have an MRI of the thoracic spine to follow-up on the syrinx, but still has not done that.  States that he was having pain around rib cage that he can't describe and the right was worse than the left.  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  Little better in last 3 days.  Been on vytorin for few years.  Having voice trail off at end of sentences.  Notices some drooling.  Is planning on getting exercise bike.    Admits to drinking 1/3 bottle of vodka per day  03/14/16 update:  Pt f/u today.  On mirapex 1.5  mg daily.  Denies compulsive behaviors (admits to excessive EtOH but this was prior to mirapex).  He does state that he has dropped his alcohol some, but despite urging, he is unable to tell me how much he is drinking.  Has hx of syringomyelia but has refused to go to follow up on that despite urging.  Has a very low B12 level of 130.  Does request his B12 injection today.  He bought himself a bike but hasn't done it in a while.    07/18/16 update:  Patient remains on pramipexole ER, 1.5 mg daily.  No falls; occasional loss of balance.  Has some visual distortions but no hallucinations.  States that he has been stable on this medication.  Does have B12 deficiency.  Has not been faithful with coming for B12 injections.  Last B12 injection was 04/14/2016.  When asked about how much EtOH he is  drinking he states "I'm still drinking too much."  States that his work is very physical (walking up and down stairs frequently) and that is the majority of exercise that he gets.  11/21/16 update:  Patient seen today in follow-up.  Patient wanted to change from ER to mirapex 0.5 mg tid but hasn't quite finished the ER yet.  Once he does he will change.  Pt denies falls.  Pt denies lightheadedness, near syncope.  No hallucinations but some visual distortions.  Mood has been good.  Changed to oral b12 supplements due to lack of compliance with injections.  Having pain in the low back.  No radiation down the leg.  Worse when riding the golf cart than when playing golf or walking.  States that trying to decrease EtOH but still at 1/3 bottle vodka per day.  03/23/17 update: Patient seen today in follow-up for his Parkinson's disease.  Patient is on pramipexole 0.5 mg 3 times per day.  No compulsive behavior.  Occasional lightheadedness but no near syncope.  No sleep attacks.  No falls.  No hallucinations.  His mood is good.  In regards to alcohol, he states that "I'm drinking less than what I was."  He refuses to answer the question even when asked multiple times.    No new medical problems.  On 1064mcg b12 oral.  08/21/17 update: Patient is seen today in follow-up for Parkinson's disease.  He is supposed to be on pramipexole 0.5 mg 3 times per day (he admits he is taking 2 in the AM and one at lunch and none in evening).  He denies compulsive behavior.  No falls.  No syncope or near syncope.  He notes more drooling and more tremor.  He has lost a lot of weight and his goal is to lose 60 more pounds.  He reports that he is doing this through low-carb diet and through walking 12,000 steps per day.  Continues to drink alcohol but cannot tell me the amount except to say that it was less than previous.  Neuroimaging of the brain has previously been performed.  It was apparently done years ago for  headache.   ALLERGIES:   Allergies  Allergen Reactions  . Hydrocodone Nausea Only    CURRENT MEDICATIONS:  Outpatient Encounter Medications as of 08/21/2017  Medication Sig  . aspirin EC 81 MG tablet Take 81 mg by mouth daily.  Marland Kitchen ezetimibe-simvastatin (VYTORIN) 10-20 MG tablet TAKE 1 TABLET BY MOUTH AT BEDTIME.  Marland Kitchen KLOR-CON M10 10 MEQ tablet TAKE 1 TABLET BY MOUTH TWICE DAILY.  Marland Kitchen losartan-hydrochlorothiazide (  HYZAAR) 100-25 MG tablet TAKE 1 TABLET BY MOUTH DAILY.  Marland Kitchen MATZIM LA 360 MG 24 hr tablet TAKE 1 TABLET BY MOUTH DAILY.  . vitamin B-12 (CYANOCOBALAMIN) 1000 MCG tablet Take 1,000 mcg by mouth daily.  . [DISCONTINUED] pramipexole (MIRAPEX) 0.5 MG tablet TAKE 1 TABLET (0.5 MG TOTAL) BY MOUTH 3 (THREE) TIMES DAILY.  . rotigotine (NEUPRO) 4 MG/24HR Place 1 patch onto the skin daily.  . rotigotine (NEUPRO) 4 MG/24HR Place 1 patch onto the skin daily.  . [DISCONTINUED] potassium chloride (K-DUR) 10 MEQ tablet Take 1 tablet (10 mEq total) by mouth 2 (two) times daily.   No facility-administered encounter medications on file as of 08/21/2017.     PAST MEDICAL HISTORY:   Past Medical History:  Diagnosis Date  . Cancer ([place])    skin, basal cell face and abdomen  . Herniated disc, cervical   . Hyperlipidemia   . Hypertension     PAST SURGICAL HISTORY:   Past Surgical History:  Procedure Laterality Date  . BASAL CELL CARCINOMA EXCISION    . EYE SURGERY     as a child  . FINGER FRACTURE SURGERY     5th digit, left hand, x2  . HERNIA REPAIR     age 18    SOCIAL HISTORY:   Social History   Socioeconomic History  . Marital status: Married    Spouse name: Not on file  . Number of children: 1  . Years of education: Not on file  . Highest education level: Not on file  Occupational History  . Occupation: Artist  Social Needs  . Financial resource strain: Not on file  . Food insecurity:    Worry: Not on file    Inability: Not on file  .  Transportation needs:    Medical: Not on file    Non-medical: Not on file  Tobacco Use  . Smoking status: Never Smoker  . Smokeless tobacco: Former Systems developer  . Tobacco comment: Dip Only.  Substance and Sexual Activity  . Alcohol use: Yes    Alcohol/week: 0.0 oz    Comment: 3-4 drinks a night  . Drug use: No  . Sexual activity: Not on file  Lifestyle  . Physical activity:    Days per week: Not on file    Minutes per session: Not on file  . Stress: Not on file  Relationships  . Social connections:    Talks on phone: Not on file    Gets together: Not on file    Attends religious service: Not on file    Active member of club or organization: Not on file    Attends meetings of clubs or organizations: Not on file    Relationship status: Not on file  . Intimate partner violence:    Fear of current or ex partner: Not on file    Emotionally abused: Not on file    Physically abused: Not on file    Forced sexual activity: Not on file  Other Topics Concern  . 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 Father  Deceased at age 42       MI, DM  . Mother  Alive       HTN  . Brother  Deceased       liver problems  . Brother  Alive       DM  . Sister  Alive       HTN    ROS:  A complete 10 system review of systems was obtained and was unremarkable apart from what is mentioned above.  PHYSICAL EXAMINATION:    VITALS:   Vitals:   08/21/17 1528  BP: 100/72  Pulse: (!) 54  SpO2: 96%  Weight: 222 lb (100.7 kg)  Height: 5\' 11"  (1.803 m)   Wt Readings from Last 3 Encounters:  08/21/17 222 lb (100.7 kg)  08/09/17 220 lb (99.8 kg)  03/23/17 241 lb (109.3 kg)     GEN:  The patient appears stated age and is  in NAD. HEENT:  Normocephalic, atraumatic.  The mucous membranes are moist. The superficial temporal arteries are without ropiness or tenderness.Pooling of saliva about the corner of the mouth is noted. CV:  RRR With ectopic beats Lungs:  CTAB Neck/HEME:  There are no carotid bruits bilaterally.  Neurological examination:  Orientation: The patient is alert and oriented x3. Fund of knowledge is appropriate.  Recent and remote memory are intact.  Attention and concentration are normal.    Able to name objects and repeat phrases. Cranial nerves: There is good facial symmetry. There is facial hypomimia.   The speech is fluent and clear. Minimal trouble with gutteral sounds.  Soft palate rises symmetrically and there is no tongue deviation. Hearing is intact to conversational tone. Sensation: Sensation is intact to light touch throughout. Motor: Strength is 5/5 in the bilateral upper and lower extremities.   Shoulder shrug is equal and symmetric.  There is no pronator drift.   Movement examination: Tone: There is mild increase in the right upper extremity Abnormal movements: With tremor in the right upper extremity Coordination:  There is mild decremation with  alternation of supination/pronation of the forearm on the right only.  All other RAMs are normal Gait and Station: The patient has no difficulty arising out of a deep-seated chair without the use of the hands. The patient's stride length is normal.  Armswing is decreased but just slightly.   Lab Results  Component Value Date   TSH 1.51 06/24/2015     Chemistry      Component Value Date/Time   NA 137 08/09/2017 1451   K 3.6 08/09/2017 1451   CL 98 08/09/2017 1451   CO2 34 (H) 08/09/2017 1451   BUN 14 08/09/2017 1451   CREATININE 0.99 08/09/2017 1451      Component Value Date/Time   CALCIUM 10.0 08/09/2017 1451   ALKPHOS 31 (L) 08/09/2017 1451   AST 22 08/09/2017 1451   ALT 27 08/09/2017 1451   BILITOT 0.8 08/09/2017 1451      Lab Results  Component Value Date   VITAMINB12 658 11/21/2016     ASSESSMENT/PLAN:  1.   Parkinson's disease.  The patient has tremor, bradykinesia, mild rigidity.  -I am going to discontinue his pramipexole and change him to Neupro.  I think that would help with a smoother 24-hour day release.  Samples and a prescription were given.  Discussed risks, benefits, and side effects.  Discussed how to put the patch on and discussed the importance of rotating sites.  A diagram was shown for proper site placement.  He may need to shave a little bit of his upper arm/like to  make this happen adequately.  -Discussed importance of cardiovascular exercise. 2.  EtOH overuse  -talked to him once again about the importance of decreasing and subsequently discontinuing his alcohol.  Unclear how much he is drinking as he really does not answer when asked. 3.  B12 deficiency  -on oral supplements.  Is taking 107mcg daily 4.  Cervical spinal stenosis with syringomyelia  -denies significant neck pain  -Needs further investigation but patient not ready to do this.  MRI last done in 2014 and it was recommended then to do contrasted scan and do MRI thoracic scan to rule out structural lesion.  Revisit this issue again today.  Overall, he thinks he is feeling well and is a symptom that can does not wish to move forward with this. 5.  LBP  -refused neuroimaging.  6.  Follow up is anticipated in the next few months, sooner should new neurologic issues arise.  Much greater than 50% of this visit was spent in counseling and coordinating care.  Total face to face time:  25 min

## 2017-08-21 ENCOUNTER — Ambulatory Visit: Payer: 59 | Admitting: Neurology

## 2017-08-21 ENCOUNTER — Encounter: Payer: Self-pay | Admitting: Neurology

## 2017-08-21 VITALS — BP 100/72 | HR 54 | Ht 71.0 in | Wt 222.0 lb

## 2017-08-21 DIAGNOSIS — G20 Parkinson's disease: Secondary | ICD-10-CM

## 2017-08-21 DIAGNOSIS — G950 Syringomyelia and syringobulbia: Secondary | ICD-10-CM

## 2017-08-21 MED ORDER — ROTIGOTINE 4 MG/24HR TD PT24: 1.0000 | MEDICATED_PATCH | Freq: Every day | TRANSDERMAL | 5 refills | Status: DC

## 2017-08-21 MED ORDER — ROTIGOTINE 4 MG/24HR TD PT24: 1.0000 | MEDICATED_PATCH | Freq: Every day | TRANSDERMAL | 0 refills | Status: DC

## 2017-08-21 NOTE — Patient Instructions (Addendum)
1. Stop Pramipexole   2. Start Neupro 4 mg patches. Samples given. We will call in a couple weeks to see how you are doing.   3.    4.   [facility]'s [facility]  The [facility] team know that living well with a movement disorder extends far beyond our clinic walls. We are together with you. Our team is passionate about providing resources to you and your loved ones who are living with Parkinson's disease and movement disorders. Participate in these programs and join our community. These resources are free or low cost!   [facility] is adding:   Innovative educational programs for patients and caregivers.   Support groups for patients and caregivers living with Parkinson's disease.   Parkinson's specific exercise programs.   Custom tailored therapeutic programs that will benefit patient's living with Parkinson's disease.   We are in this together. You can help and contribute to grow these programs and resources in our community. 100% of the funds donated to the [facility] stays right here in our community to support patients and their caregivers.  To make a tax deductible contribution:  -ask for a [facility] Parkinson's envelope in the office today.  - call the Office of Institutional Advancement at 4052994320.

## 2017-08-23 ENCOUNTER — Other Ambulatory Visit (INDEPENDENT_AMBULATORY_CARE_PROVIDER_SITE_OTHER): Payer: 59

## 2017-08-23 DIAGNOSIS — Z1211 Encounter for screening for malignant neoplasm of colon: Secondary | ICD-10-CM | POA: Diagnosis not present

## 2017-08-23 LAB — FECAL OCCULT BLOOD, IMMUNOCHEMICAL: Fecal Occult Bld: NEGATIVE

## 2017-08-26 ENCOUNTER — Other Ambulatory Visit: Payer: Self-pay | Admitting: Internal Medicine

## 2017-09-05 ENCOUNTER — Other Ambulatory Visit: Payer: Self-pay | Admitting: Internal Medicine

## 2017-09-05 ENCOUNTER — Telehealth: Payer: Self-pay | Admitting: Neurology

## 2017-09-05 MED ORDER — ROTIGOTINE 6 MG/24HR TD PT24: 1.0000 | MEDICATED_PATCH | Freq: Every day | TRANSDERMAL | 0 refills | Status: DC

## 2017-09-05 NOTE — Telephone Encounter (Signed)
Called patient to see how he is doing on Neupro 4 mg. He states sleeping is better, but tremors are no better. Tremors may even be a little bit worse. Please advise.

## 2017-09-05 NOTE — Telephone Encounter (Signed)
Increase to 6 mg patches.

## 2017-09-05 NOTE — Telephone Encounter (Signed)
Patient made aware. He will pick up 6 mg samples at the front desk. He will let us know how he does on the patches.

## 2017-09-21 ENCOUNTER — Telehealth: Payer: Self-pay | Admitting: Neurology

## 2017-09-21 MED ORDER — ROTIGOTINE 8 MG/24HR TD PT24: 1.0000 | MEDICATED_PATCH | Freq: Every day | TRANSDERMAL | 0 refills | Status: DC

## 2017-09-21 NOTE — Telephone Encounter (Signed)
1.     Name of medication: Neupro  2.     How are you currently taking this medication (dosage and times per day)? Once a day  3.     Are you having a reaction (difficulty breathing--STAT)?   4.     What is your medication issue? Tremors are worse since the dosage was increased

## 2017-09-21 NOTE — Telephone Encounter (Signed)
Patient states he will try it. Samples at the front for pick up. He will let us know how he does.

## 2017-09-21 NOTE — Telephone Encounter (Signed)
Please advise 

## 2017-09-21 NOTE — Telephone Encounter (Signed)
That doesn't make much sense.  I think that he is likely just having more tremor.  Highest it comes is an 8mg  patch if he wants to try that.

## 2017-10-02 ENCOUNTER — Telehealth: Payer: Self-pay | Admitting: Neurology

## 2017-10-02 NOTE — Telephone Encounter (Signed)
*  STAT* If patient is at the pharmacy, call can be transferred to refill team.  1.     Which medications need to be refilled? (please list name of each medication and dose if know) Neupro  2.     Which pharmacy/location (including street and city if local pharmacy) is medication to be sent to? [facility]  3.     Do they need a 30 or 90 day supply? Usually picks up samples but wants a prescription called in 8mg , will stop by to get a coupon for the prescription

## 2017-10-03 MED ORDER — ROTIGOTINE 8 MG/24HR TD PT24: 1.0000 | MEDICATED_PATCH | Freq: Every day | TRANSDERMAL | 1 refills | Status: DC

## 2017-10-03 NOTE — Telephone Encounter (Signed)
RX sent to pharmacy  

## 2017-10-03 NOTE — Telephone Encounter (Signed)
Pt was provided with coupon

## 2017-10-14 ENCOUNTER — Other Ambulatory Visit: Payer: Self-pay | Admitting: Internal Medicine

## 2018-01-11 NOTE — Progress Notes (Signed)
[name] [name] was seen today in the movement disorders clinic for neurologic consultation at the request of [name], MD.  The consultation is for the evaluation of R hand tremor x 1 year.  The records that were made available to me were reviewed.  Pt also brought in records/films from [name].  Pt states that he initially thought that tremor was coming from a problem in his cervical spine.  He was having neck and R arm pain and went to [facility].  I did review an MRI of his cervical spine that he brought me, dated 02/13/2013.  There was a moderate to large disc protrusion at the C6-C7 level which compressed the right C7 nerve root.  There was no associated spinal cord signal change at that level.  There was evidence of a syringomyelia but no cord expansion.  It was recommended that the patient undergo a repeat contrasted examination as well as an MRI of the thoracic spine.  I do not see that this was completed, at least on the images that he brought me and the images that are available through Epic.  He reports that it was not done.  He had injections and the pain got better for about 6 months but came back but he didn't follow back up.    07/29/15 update: The patient follows up today regarding his new diagnosis of Parkinson's disease, accompanied by his wife who supplements the history.  He did not want to start on any new medications last visit.  He has been about the same except that he has noted some occasional tremor in the left upper extremity which is new.  No falls.  No hallucinations.  No lightheadedness or near syncope.  He did have an MRI of the brain without gadolinium on 06/30/2015.  I reviewed this and it was negative.  He was supposed to have an MRI of the cervical and thoracic spine.  This was primarily supposed to be a follow-up from 2014, at which point he had cervical spinal stenosis with syringomyelia and it was recommended at that time that he have a thoracic scan to rule  out a structural lesion.  He did not have it done then, so I ordered at last visit, but he he initially needed sedation.  I prescribed him Valium, but he did not go back and have the scan done.    11/09/15 update:  The patient follows up today.  Last visit, I started the patient on Mirapex ER, 1.5 mg daily.  The patient states that it has definitely helped the tremor.  He denies compulsive behaviors.  No sleep attacks.  No falls but isn't as "sure footed" as was in the past.  No hallucinations.  No lightheadedness or near syncope.  He was supposed to have an MRI of the thoracic spine to follow-up on the syrinx, but still has not done that.  States that he was having pain around rib cage that he can't describe and the right was worse than the left.  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  Little better in last 3 days.  Been on vytorin for few years.  Having voice trail off at end of sentences.  Notices some drooling.  Is planning on getting exercise bike.    Admits to drinking 1/3 bottle of vodka per day  03/14/16 update:  Pt f/u today.  On mirapex 1.5  mg daily.  Denies compulsive behaviors (admits to excessive EtOH but this was prior to mirapex).  He does state that he has dropped his alcohol some, but despite urging, he is unable to tell me how much he is drinking.  Has hx of syringomyelia but has refused to go to follow up on that despite urging.  Has a very low B12 level of 130.  Does request his B12 injection today.  He bought himself a bike but hasn't done it in a while.    07/18/16 update:  Patient remains on pramipexole ER, 1.5 mg daily.  No falls; occasional loss of balance.  Has some visual distortions but no hallucinations.  States that he has been stable on this medication.  Does have B12 deficiency.  Has not been faithful with coming for B12 injections.  Last B12 injection was 04/14/2016.  When asked about how much EtOH he is  drinking he states "I'm still drinking too much."  States that his work is very physical (walking up and down stairs frequently) and that is the majority of exercise that he gets.  11/21/16 update:  Patient seen today in follow-up.  Patient wanted to change from ER to mirapex 0.5 mg tid but hasn't quite finished the ER yet.  Once he does he will change.  Pt denies falls.  Pt denies lightheadedness, near syncope.  No hallucinations but some visual distortions.  Mood has been good.  Changed to oral b12 supplements due to lack of compliance with injections.  Having pain in the low back.  No radiation down the leg.  Worse when riding the golf cart than when playing golf or walking.  States that trying to decrease EtOH but still at 1/3 bottle vodka per day.  03/23/17 update: Patient seen today in follow-up for his Parkinson's disease.  Patient is on pramipexole 0.5 mg 3 times per day.  No compulsive behavior.  Occasional lightheadedness but no near syncope.  No sleep attacks.  No falls.  No hallucinations.  His mood is good.  In regards to alcohol, he states that "I'm drinking less than what I was."  He refuses to answer the question even when asked multiple times.    No new medical problems.  On 1023mcg b12 oral.  08/21/17 update: Patient is seen today in follow-up for Parkinson's disease.  He is supposed to be on pramipexole 0.5 mg 3 times per day (he admits he is taking 2 in the AM and one at lunch and none in evening).  He denies compulsive behavior.  No falls.  No syncope or near syncope.  He notes more drooling and more tremor.  He has lost a lot of weight and his goal is to lose 60 more pounds.  He reports that he is doing this through low-carb diet and through walking 12,000 steps per day.  Continues to drink alcohol but cannot tell me the amount except to say that it was less than previous.  01/14/18 update: The patient is seen today in follow-up for Parkinson's disease.  I changed him to Neupro since our  last visit.  He thought that the 6 mg made tremor worse, which really did not make sense.  He was then given 8 mg and has been on that ever since.  "I feel okay but the tremor is still bad."  If he misses the patch, he will get more tremor and more stiffness.  He has had no compulsive behaviors.  No lightheadedness or near syncope.  "  I'm drooling like crazy."  He states that he is cutting down alcohol.  I pushed him on this and ultimately he stated that he was drinking half a bottle of vodka per day, which he states is less than previous.  Neuroimaging of the brain has previously been performed.  It was apparently done years ago for headache.   ALLERGIES:   Allergies  Allergen Reactions  . Hydrocodone Nausea Only    CURRENT MEDICATIONS:  Outpatient Encounter Medications as of 01/14/2018  Medication Sig  . aspirin EC 81 MG tablet Take 81 mg by mouth daily.  Marland Kitchen ezetimibe-simvastatin (VYTORIN) 10-20 MG tablet TAKE 1 TABLET BY MOUTH AT BEDTIME.  Marland Kitchen KLOR-CON M10 10 MEQ tablet TAKE 1 TABLET BY MOUTH TWICE DAILY.  Marland Kitchen losartan-hydrochlorothiazide (HYZAAR) 100-25 MG tablet TAKE 1 TABLET BY MOUTH DAILY.  Marland Kitchen MATZIM LA 360 MG 24 hr tablet TAKE 1 TABLET BY MOUTH DAILY.  Marland Kitchen Rotigotine (NEUPRO) 8 MG/24HR PT24 Place 1 patch onto the skin daily.  . vitamin B-12 (CYANOCOBALAMIN) 1000 MCG tablet Take 1,000 mcg by mouth daily.  . carbidopa-levodopa (SINEMET IR) 25-100 MG tablet Take 1 tablet by mouth 3 (three) times daily.  . [DISCONTINUED] potassium chloride (K-DUR) 10 MEQ tablet Take 1 tablet (10 mEq total) by mouth 2 (two) times daily.  . [DISCONTINUED] rotigotine (NEUPRO) 6 MG/24HR Place 1 patch onto the skin daily.   No facility-administered encounter medications on file as of 01/14/2018.     PAST MEDICAL HISTORY:   Past Medical History:  Diagnosis Date  . Cancer ([place])    skin, basal cell face and abdomen  . Herniated disc, cervical   . Hyperlipidemia   . Hypertension     PAST SURGICAL HISTORY:     Past Surgical History:  Procedure Laterality Date  . BASAL CELL CARCINOMA EXCISION    . EYE SURGERY     as a child  . FINGER FRACTURE SURGERY     5th digit, left hand, x2  . HERNIA REPAIR     age 61    SOCIAL HISTORY:   Social History   Socioeconomic History  . Marital status: Married    Spouse name: Not on file  . Number of children: 1  . Years of education: Not on file  . Highest education level: Not on file  Occupational History  . Occupation: Artist  Social Needs  . Financial resource strain: Not on file  . Food insecurity:    Worry: Not on file    Inability: Not on file  . Transportation needs:    Medical: Not on file    Non-medical: Not on file  Tobacco Use  . Smoking status: Never Smoker  . Smokeless tobacco: Former Systems developer  . Tobacco comment: Dip Only.  Substance and Sexual Activity  . Alcohol use: Yes    Alcohol/week: 0.0 standard drinks    Comment: 1/2 bottle of vodka per night  . Drug use: No  . Sexual activity: Not on file  Lifestyle  . Physical activity:    Days per week: Not on file    Minutes per session: Not on file  . Stress: Not on file  Relationships  . Social connections:    Talks on phone: Not on file    Gets together: Not on file    Attends religious service: Not on file    Active member of club or organization: Not on file    Attends meetings of clubs or organizations: Not on  file    Relationship status: Not on file  . Intimate partner violence:    Fear of current or ex partner: Not on file    Emotionally abused: Not on file    Physically abused: Not on file    Forced sexual activity: Not on file  Other Topics Concern  . 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 Father  Deceased at age 67       MI, DM  . Mother  Alive       HTN  . Brother  Deceased       liver problems  . Brother  Alive       DM  . Sister  Alive       HTN    ROS:  A complete 10 system review of systems was obtained and was unremarkable apart from what is mentioned above.  PHYSICAL EXAMINATION:    VITALS:   Vitals:   01/14/18 1515  BP: 132/80  Pulse: 66  SpO2: 96%  Weight: 229 lb (103.9 kg)  Height: 5\' 11"  (1.803 m)   Wt Readings from Last 3 Encounters:  01/14/18 229 lb (103.9 kg)  08/21/17 222 lb (100.7 kg)  08/09/17 220 lb (99.8 kg)    GEN:  The patient appears stated age and is in NAD. HEENT:  Normocephalic, atraumatic.  The mucous membranes are moist. The superficial temporal arteries are without ropiness or tenderness. CV:  RRR Lungs:  CTAB Neck/HEME:  There are no carotid bruits bilaterally.  Neurological examination:  Orientation: The patient is alert and oriented x3. Cranial nerves: There is good facial symmetry.  There is significant facial hypomimia.  The speech is fluent and clear. Soft palate rises symmetrically and there is no tongue deviation. Hearing is intact to conversational tone. Sensation: Sensation is intact to light touch throughout Motor: Strength is 5/5 in the bilateral upper and lower extremities.   Shoulder shrug is equal and symmetric.  There is no pronator drift.  Movement examination: Tone: There is normal tone in the upper and lower extremities today. Abnormal movements: There is intermittent tremor in the right upper extremity.  He has mouth tremor when distracted. Coordination:  There is mild decremation of all rapid alternating movements in the right compared to that of the left. Gait and Station: The patient has no difficulty arising out of a deep-seated chair without the use of the hands. The patient's stride length is normal.  Armswing is decreased.  He actually runs down the hall better than  he walks.  Lab Results  Component Value Date   TSH 1.51 06/24/2015     Chemistry      Component Value Date/Time   NA 137 08/09/2017 1451   K 3.6 08/09/2017 1451   CL 98 08/09/2017 1451   CO2 34 (H) 08/09/2017 1451   BUN 14 08/09/2017 1451   CREATININE 0.99 08/09/2017 1451      Component Value Date/Time   CALCIUM 10.0 08/09/2017 1451   ALKPHOS 31 (L) 08/09/2017 1451   AST 22 08/09/2017 1451   ALT 27 08/09/2017 1451   BILITOT 0.8 08/09/2017 1451     Lab Results  Component Value Date  VITAMINB12 658 11/21/2016     ASSESSMENT/PLAN:  1.   Parkinson's disease.  The patient has tremor, bradykinesia, mild rigidity.  -Continue Neupro, 8 mg daily.  Risks, benefits, and side effects discussed.  -Start carbidopa/levodopa 25/100, 1 tablet 3 times per day.  I did not work him up slowly on this medication, primarily because I worry about compliance the more complicated I make it.  I suspect he will be able to tolerate this given young age.  -Talked about surgical interventions in detail.  Talked about what they would help with him but they would not.  -Talked about the importance of adding cardiovascular exercise in. 2.  EtOH overuse  -Drinking half bottle of vodka per day.  I have talked to him many times about decreasing alcohol for many reasons and he states that he really is trying to work on this. 3.  B12 deficiency  -on oral supplements.  Is taking 1073mcg daily 4.  Cervical spinal stenosis with syringomyelia  -denies significant neck pain  -Needs further investigation but patient not ready to do this.  MRI last done in 2014 and it was recommended then to do contrasted scan and do MRI thoracic scan to rule out structural lesion.  Revisit this issue again today.  He doesn't wish to proceed and he understands consequences.   5.  LBP  -refused neuroimaging.  6.  I will plan on seeing him back in 3 to 4 months, sooner should new neurologic issues arise.  Much greater than 50% of  this visit was spent in counseling and coordinating care.  Total face to face time:  28 min

## 2018-01-14 ENCOUNTER — Encounter: Payer: Self-pay | Admitting: Neurology

## 2018-01-14 ENCOUNTER — Ambulatory Visit: Payer: 59 | Admitting: Neurology

## 2018-01-14 VITALS — BP 132/80 | HR 66 | Ht 71.0 in | Wt 229.0 lb

## 2018-01-14 DIAGNOSIS — G950 Syringomyelia and syringobulbia: Secondary | ICD-10-CM

## 2018-01-14 DIAGNOSIS — F1010 Alcohol abuse, uncomplicated: Secondary | ICD-10-CM

## 2018-01-14 DIAGNOSIS — G20 Parkinson's disease: Secondary | ICD-10-CM | POA: Diagnosis not present

## 2018-01-14 MED ORDER — CARBIDOPA-LEVODOPA 25-100 MG PO TABS: 1.0000 | ORAL_TABLET | Freq: Three times a day (TID) | ORAL | 3 refills | Status: DC

## 2018-01-14 NOTE — Patient Instructions (Signed)
Start Carbidopa Levodopa 25/100 IR 1 tablet at 6 am, 1 tablet at 10 am, and 1 tablet at 3 pm.

## 2018-01-15 ENCOUNTER — Ambulatory Visit: Payer: 59 | Admitting: Neurology

## 2018-02-17 ENCOUNTER — Other Ambulatory Visit: Payer: Self-pay | Admitting: Internal Medicine

## 2018-03-28 ENCOUNTER — Other Ambulatory Visit: Payer: Self-pay | Admitting: Neurology

## 2018-04-30 ENCOUNTER — Other Ambulatory Visit: Payer: Self-pay | Admitting: Neurology

## 2018-05-16 ENCOUNTER — Other Ambulatory Visit: Payer: Self-pay | Admitting: Internal Medicine

## 2018-05-16 MED ORDER — LOSARTAN POTASSIUM 100 MG PO TABS: 100.0000 mg | ORAL_TABLET | Freq: Every day | ORAL | 3 refills | Status: DC

## 2018-05-16 MED ORDER — HYDROCHLOROTHIAZIDE 25 MG PO TABS: 25.0000 mg | ORAL_TABLET | Freq: Every day | ORAL | 3 refills | Status: DC

## 2018-05-30 NOTE — Progress Notes (Signed)
[name] [name] was seen today in the movement disorders clinic for neurologic consultation at the request of [name], MD.  The consultation is for the evaluation of R hand tremor x 1 year.  The records that were made available to me were reviewed.  Pt also brought in records/films from [name].  Pt states that he initially thought that tremor was coming from a problem in his cervical spine.  He was having neck and R arm pain and went to [facility].  I did review an MRI of his cervical spine that he brought me, dated 02/13/2013.  There was a moderate to large disc protrusion at the C6-C7 level which compressed the right C7 nerve root.  There was no associated spinal cord signal change at that level.  There was evidence of a syringomyelia but no cord expansion.  It was recommended that the patient undergo a repeat contrasted examination as well as an MRI of the thoracic spine.  I do not see that this was completed, at least on the images that he brought me and the images that are available through Epic.  He reports that it was not done.  He had injections and the pain got better for about 6 months but came back but he didn't follow back up.    07/29/15 update: The patient follows up today regarding his new diagnosis of Parkinson's disease, accompanied by his wife who supplements the history.  He did not want to start on any new medications last visit.  He has been about the same except that he has noted some occasional tremor in the left upper extremity which is new.  No falls.  No hallucinations.  No lightheadedness or near syncope.  He did have an MRI of the brain without gadolinium on 06/30/2015.  I reviewed this and it was negative.  He was supposed to have an MRI of the cervical and thoracic spine.  This was primarily supposed to be a follow-up from 2014, at which point he had cervical spinal stenosis with syringomyelia and it was recommended at that time that he have a thoracic scan to rule  out a structural lesion.  He did not have it done then, so I ordered at last visit, but he he initially needed sedation.  I prescribed him Valium, but he did not go back and have the scan done.    11/09/15 update:  The patient follows up today.  Last visit, I started the patient on Mirapex ER, 1.5 mg daily.  The patient states that it has definitely helped the tremor.  He denies compulsive behaviors.  No sleep attacks.  No falls but isn't as "sure footed" as was in the past.  No hallucinations.  No lightheadedness or near syncope.  He was supposed to have an MRI of the thoracic spine to follow-up on the syrinx, but still has not done that.  States that he was having pain around rib cage that he can't describe and the right was worse than the left.  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  Little better in last 3 days.  Been on vytorin for few years.  Having voice trail off at end of sentences.  Notices some drooling.  Is planning on getting exercise bike.    Admits to drinking 1/3 bottle of vodka per day  03/14/16 update:  Pt f/u today.  On mirapex 1.5  mg daily.  Denies compulsive behaviors (admits to excessive EtOH but this was prior to mirapex).  He does state that he has dropped his alcohol some, but despite urging, he is unable to tell me how much he is drinking.  Has hx of syringomyelia but has refused to go to follow up on that despite urging.  Has a very low B12 level of 130.  Does request his B12 injection today.  He bought himself a bike but hasn't done it in a while.    07/18/16 update:  Patient remains on pramipexole ER, 1.5 mg daily.  No falls; occasional loss of balance.  Has some visual distortions but no hallucinations.  States that he has been stable on this medication.  Does have B12 deficiency.  Has not been faithful with coming for B12 injections.  Last B12 injection was 04/14/2016.  When asked about how much EtOH he is  drinking he states "I'm still drinking too much."  States that his work is very physical (walking up and down stairs frequently) and that is the majority of exercise that he gets.  11/21/16 update:  Patient seen today in follow-up.  Patient wanted to change from ER to mirapex 0.5 mg tid but hasn't quite finished the ER yet.  Once he does he will change.  Pt denies falls.  Pt denies lightheadedness, near syncope.  No hallucinations but some visual distortions.  Mood has been good.  Changed to oral b12 supplements due to lack of compliance with injections.  Having pain in the low back.  No radiation down the leg.  Worse when riding the golf cart than when playing golf or walking.  States that trying to decrease EtOH but still at 1/3 bottle vodka per day.  03/23/17 update: Patient seen today in follow-up for his Parkinson's disease.  Patient is on pramipexole 0.5 mg 3 times per day.  No compulsive behavior.  Occasional lightheadedness but no near syncope.  No sleep attacks.  No falls.  No hallucinations.  His mood is good.  In regards to alcohol, he states that "I'm drinking less than what I was."  He refuses to answer the question even when asked multiple times.    No new medical problems.  On 1023mcg b12 oral.  08/21/17 update: Patient is seen today in follow-up for Parkinson's disease.  He is supposed to be on pramipexole 0.5 mg 3 times per day (he admits he is taking 2 in the AM and one at lunch and none in evening).  He denies compulsive behavior.  No falls.  No syncope or near syncope.  He notes more drooling and more tremor.  He has lost a lot of weight and his goal is to lose 60 more pounds.  He reports that he is doing this through low-carb diet and through walking 12,000 steps per day.  Continues to drink alcohol but cannot tell me the amount except to say that it was less than previous.  01/14/18 update: The patient is seen today in follow-up for Parkinson's disease.  I changed him to Neupro since our  last visit.  He thought that the 6 mg made tremor worse, which really did not make sense.  He was then given 8 mg and has been on that ever since.  "I feel okay but the tremor is still bad."  If he misses the patch, he will get more tremor and more stiffness.  He has had no compulsive behaviors.  No lightheadedness or near syncope.  "  I'm drooling like crazy."  He states that he is cutting down alcohol.  I pushed him on this and ultimately he stated that he was drinking half a bottle of vodka per day, which he states is less than previous.  06/03/2018 update: Patient is seen today in follow-up for Parkinson's disease.  He is on Neupro, 8 mg daily.  I started him on carbidopa/levodopa 25/100, 1 tablet 3 times per day last visit.  He reports that "most of the time, I take it twice a day."  He has had no falls but he has had a few "close calls."  No lightheadedness or near syncope.  Unfortunately, he continues to drink a significant amount of alcohol per day.  He is having some trouble with pain when he first gets out of bed, but it is primarily leg and foot pain on the right.  He is getting a new bed because he is having trouble getting in and out of the tall but he has.  Once he gets moving, he feels okay.  He denies significant back or neck pain.  Neuroimaging of the brain has previously been performed.  It was apparently done years ago for headache.   ALLERGIES:   Allergies  Allergen Reactions  . Hydrocodone Nausea Only    CURRENT MEDICATIONS:  Outpatient Encounter Medications as of 06/03/2018  Medication Sig  . aspirin EC 81 MG tablet Take 81 mg by mouth daily.  . carbidopa-levodopa (SINEMET IR) 25-100 MG tablet TAKE 1 TABLET BY MOUTH THREE TIMES A DAY  . ezetimibe-simvastatin (VYTORIN) 10-20 MG tablet TAKE 1 TABLET BY MOUTH AT BEDTIME.  . hydrochlorothiazide (HYDRODIURIL) 25 MG tablet Take 1 tablet (25 mg total) by mouth daily.  Marland Kitchen KLOR-CON M10 10 MEQ tablet TAKE 1 TABLET BY MOUTH TWICE DAILY.  Marland Kitchen  losartan (COZAAR) 100 MG tablet Take 1 tablet (100 mg total) by mouth daily.  Marland Kitchen MATZIM LA 360 MG 24 hr tablet TAKE 1 TABLET BY MOUTH DAILY.  Marland Kitchen NEUPRO 8 MG/24HR PT24 PLACE 1 PATCH ONTO THE SKIN DAILY.  . vitamin B-12 (CYANOCOBALAMIN) 1000 MCG tablet Take 1,000 mcg by mouth daily.  . [DISCONTINUED] potassium chloride (K-DUR) 10 MEQ tablet Take 1 tablet (10 mEq total) by mouth 2 (two) times daily.   No facility-administered encounter medications on file as of 06/03/2018.     PAST MEDICAL HISTORY:   Past Medical History:  Diagnosis Date  . Cancer ([place])    skin, basal cell face and abdomen  . Herniated disc, cervical   . Hyperlipidemia   . Hypertension     PAST SURGICAL HISTORY:   Past Surgical History:  Procedure Laterality Date  . BASAL CELL CARCINOMA EXCISION    . EYE SURGERY     as a child  . FINGER FRACTURE SURGERY     5th digit, left hand, x2  . HERNIA REPAIR     age 53    SOCIAL HISTORY:   Social History   Socioeconomic History  . Marital status: Married    Spouse name: Not on file  . Number of children: 1  . Years of education: Not on file  . Highest education level: Not on file  Occupational History  . Occupation: Artist  Social Needs  . Financial resource strain: Not on file  . Food insecurity:    Worry: Not on file    Inability: Not on file  . Transportation needs:    Medical: Not on file  Non-medical: Not on file  Tobacco Use  . Smoking status: Never Smoker  . Smokeless tobacco: Former Systems developer  . Tobacco comment: Dip Only.  Substance and Sexual Activity  . Alcohol use: Yes    Alcohol/week: 0.0 standard drinks    Comment: 1/2 bottle of vodka per night  . Drug use: No  . Sexual activity: Not on file  Lifestyle  . Physical activity:    Days per week: Not on file    Minutes per session: Not on file  . Stress: Not on file  Relationships  . Social connections:    Talks on phone: Not on file    Gets together: Not on file     Attends religious service: Not on file    Active member of club or organization: Not on file    Attends meetings of clubs or organizations: Not on file    Relationship status: Not on file  . Intimate partner violence:    Fear of current or ex partner: Not on file    Emotionally abused: Not on file    Physically abused: Not on file    Forced sexual activity: Not on file  Other Topics Concern  . 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 Father  Deceased at age 81       MI, DM  . Mother  Alive       HTN  . Brother  Deceased       liver problems  . Brother  Alive       DM  . Sister  Alive       HTN    ROS:  Review of Systems  Constitutional: Negative.   HENT: Negative.   Eyes: Negative.   Respiratory: Negative.   Cardiovascular: Negative.   Gastrointestinal: Negative.   Genitourinary: Negative.   Skin: Negative.   Endo/Heme/Allergies: Negative.      PHYSICAL EXAMINATION:    VITALS:   Vitals:   06/03/18 1321  BP: 128/88  Pulse: 66  SpO2: 97%  Weight: 242 lb (109.8 kg)  Height: 5\' 11"  (1.803 m)   Wt Readings from Last 3 Encounters:  06/03/18 242 lb (109.8 kg)  01/14/18 229 lb (103.9 kg)  08/21/17 222 lb (100.7 kg)    GEN:  The patient appears stated age and is in NAD. HEENT:  Normocephalic, atraumatic.  The mucous membranes are moist. The superficial temporal arteries are without ropiness or tenderness. CV:  RRR Lungs:  CTAB Neck/HEME:  There are no carotid bruits bilaterally.  Neurological examination:  Orientation: The patient is alert and oriented x3. Cranial nerves: There is good facial symmetry. The speech is fluent and clear. Soft palate rises symmetrically and there is no  tongue deviation. Hearing is intact to conversational tone. Sensation: Sensation is intact to light touch throughout Motor: Strength is 5/5 in the bilateral upper and lower extremities.   Shoulder shrug is equal and symmetric.  There is no pronator drift.  Movement examination: Tone: There is normal tone in the upper and lower extremities today. Abnormal movements: There is rare tremor in the right upper extremity. Coordination:  There is no decremation with any form of RAMS, including alternating supination  and pronation of the forearm, hand opening and closing, finger taps, heel taps and toe taps. Gait and Station: The patient has no difficulty arising out of a deep-seated chair without the use of the hands. The patient's stride length is normal.  Armswing is good today.    Lab Results  Component Value Date   TSH 1.51 06/24/2015     Chemistry      Component Value Date/Time   NA 137 08/09/2017 1451   K 3.6 08/09/2017 1451   CL 98 08/09/2017 1451   CO2 34 (H) 08/09/2017 1451   BUN 14 08/09/2017 1451   CREATININE 0.99 08/09/2017 1451      Component Value Date/Time   CALCIUM 10.0 08/09/2017 1451   ALKPHOS 31 (L) 08/09/2017 1451   AST 22 08/09/2017 1451   ALT 27 08/09/2017 1451   BILITOT 0.8 08/09/2017 1451     Lab Results  Component Value Date   VITAMINB12 658 11/21/2016     ASSESSMENT/PLAN:  1.   Parkinson's disease.  The patient has tremor, bradykinesia, mild rigidity.  -Continue Neupro, 8 mg daily.  Risks, benefits, and side effects discussed.  -Continue carbidopa/levodopa 25/100, but I encouraged him to actually take it 3 times per day.  He took the medication about an hour before he got here, and he looked good on it. 2.  EtOH overuse  -Each time he is here, we have a long discussion about the alcohol abuse.  Unfortunately, he really has been unable or unwilling to stop drinking.  We talked about the need to use his liver to metabolize his Parkinson's medications.  We  talked about the importance of slowly decreasing the alcohol the before stopping it to avoid alcohol withdrawal seizure.   3.  B12 deficiency  -on oral supplements.  Is taking 1079mcg daily 4.  Cervical spinal stenosis with syringomyelia  -Long discussion about this today.  This really needs further investigation.  Discussed again that his last MRI was in 2014 and at that time it was recommended to do a contrasted exam and scan the thoracic region to rule out structural lesion.  We have talked about it many times and he has refused this.  Denies significant neck pain.  I told him I would continue to revisit this issue and he will let me know if he changes his mind. 5.  LBP  -refused neuroimaging again today.  6. Follow up is anticipated in the next few months, sooner should new neurologic issues arise.  Much greater than 50% of this visit was spent in counseling and coordinating care.  Total face to face time:  25 min

## 2018-05-31 ENCOUNTER — Ambulatory Visit: Payer: 59 | Admitting: Neurology

## 2018-06-03 ENCOUNTER — Encounter: Payer: Self-pay | Admitting: Neurology

## 2018-06-03 ENCOUNTER — Ambulatory Visit: Payer: 59 | Admitting: Neurology

## 2018-06-03 VITALS — BP 128/88 | HR 66 | Ht 71.0 in | Wt 242.0 lb

## 2018-06-03 DIAGNOSIS — F1010 Alcohol abuse, uncomplicated: Secondary | ICD-10-CM | POA: Diagnosis not present

## 2018-06-03 DIAGNOSIS — G20 Parkinson's disease: Secondary | ICD-10-CM

## 2018-06-03 DIAGNOSIS — G950 Syringomyelia and syringobulbia: Secondary | ICD-10-CM | POA: Diagnosis not present

## 2018-06-03 MED ORDER — ROTIGOTINE 8 MG/24HR TD PT24: 1.0000 | MEDICATED_PATCH | Freq: Every day | TRANSDERMAL | 1 refills | Status: DC

## 2018-06-03 NOTE — Patient Instructions (Signed)
1.  Take carbidopa/levodopa 25/100 THREE times per day 2.  Let me know if you change your mind on the MRI of the low back and neck 3.  Wean and discontinue the alcohol 4.  Enjoy your sleep number!

## 2018-08-13 ENCOUNTER — Other Ambulatory Visit: Payer: Self-pay | Admitting: Neurology

## 2018-08-19 ENCOUNTER — Encounter: Payer: 59 | Admitting: Internal Medicine

## 2018-08-21 ENCOUNTER — Other Ambulatory Visit: Payer: Self-pay | Admitting: Internal Medicine

## 2018-08-27 ENCOUNTER — Telehealth: Payer: Self-pay

## 2018-08-27 NOTE — Telephone Encounter (Signed)
Received inbound call from patient regarding blood pressure medication. When BP medication was filled in Dec 2019, the dosage was prescribed separately as Cozaar and HCTZ. When BP medication was refilled in Mar 2020, the dosage was prescribed in a single pill as Hyzaar. The patient called to clarify which medication should he take at this time. Advised patient to discontinue use of and discard Cozaar and HCTZ and begin using Hyzaar. Patient verbalized understanding.

## 2018-08-27 NOTE — Telephone Encounter (Signed)
Correct

## 2018-10-07 NOTE — Progress Notes (Signed)
Virtual Visit via Video Note The purpose of this virtual visit is to provide medical care while limiting exposure to the novel coronavirus.    Consent was obtained for video visit:  Yes.   Answered questions that patient had about telehealth interaction:  Yes.   I discussed the limitations, risks, security and privacy concerns of performing an evaluation and management service by telemedicine. I also discussed with the patient that there may be a patient responsible charge related to this service. The patient expressed understanding and agreed to proceed.  Pt location: Home Physician Location: office Name of referring provider:  [name], MD I connected with [name] [name] at patients initiation/request on 10/08/2018 at  2:30 PM EDT by video enabled telemedicine application and verified that I am speaking with the correct person using two identifiers. Pt MRN:  [id number] Pt DOB:  1961-01-02 Video Participants:  [name] [name];     History of Present Illness:  Patient seen today in follow-up for Parkinson's disease.  He is on the Neupro patch, 8 mg daily.  Rarely will give him site reactions if he puts it on the side.   He is on carbidopa/levodopa 25/100, 1 tablet 3 times per day.  He has had no falls.  No lightheadedness or near syncope.  Continues to drink a significant amount of alcohol per day - "i've cut back a little bit but not much."  Records reviewed.  Has not seen primary care since March, 2019.  Does have an appointment with Dr. Silvio Pate for August.  Does admit to hip and back pain.   Observations/Objective:   Vitals:   10/08/18 1432  Weight: 230 lb (104.3 kg)  Height: 5\' 11"  (1.803 m)   GEN:  The patient appears stated age and is in NAD.  Neurological examination:  Orientation: The patient is alert and oriented x3. Cranial nerves: There is good facial symmetry. There is facial hypomimia.  The speech is fluent and clear. Soft palate rises symmetrically and there  is no tongue deviation. Hearing is intact to conversational tone. Motor: Strength is at least antigravity x 4.   Shoulder shrug is equal and symmetric.  There is no pronator drift.  Movement examination: Tone: unable Abnormal movements: There is mild jaw tremor. Coordination:  There is  decremation with RAM's, with any form of RAMS, including alternating supination and pronation of the forearm, hand opening and closing, finger taps on the left Gait and Station: The patient has a slightly stooped posture and initially shuffles, but then does better when the shuffling is pointed out.    Assessment and Plan:   1.   Parkinson's disease.  The patient has tremor, bradykinesia, mild rigidity.             -Continue Neupro, 8 mg daily.  Risks, benefits, and side effects discussed.             -Increase carbidopa/levodopa 25/100, 2 tablets in the morning, 1 in the afternoon and 1 in the evening. 2.  EtOH overuse             -Talked again about decreasing amount of alcohol. 3.  B12 deficiency             -on oral supplements.  Is taking 1042mcg daily 4.  Cervical spinal stenosis with syringomyelia             -Long discussion about this today.  This really needs further investigation.  Discussed again that his last  MRI was in 2014 and at that time it was recommended to do a contrasted exam and scan the thoracic region to rule out structural lesion.  We have talked about it many times and he has refused this.    Reminded him about this today, as I have every visit.  He states that he is "thinking about it." 5.  LBP             -refused neuroimaging again today but as with the cervical spine, he states that he is thinking about it.  Having more hip and back pain than he was.  Follow Up Instructions:  Follow-up 4 to 5 months.  -I discussed the assessment and treatment plan with the patient. The patient was provided an opportunity to ask questions and all were answered. The patient agreed with the plan  and demonstrated an understanding of the instructions.   The patient was advised to call back or seek an in-person evaluation if the symptoms worsen or if the condition fails to improve as anticipated.    Total Time spent in visit with the patient was:  25 min.   Pt understands and agrees with the plan of care outlined.     [name] Bogus, DO

## 2018-10-08 ENCOUNTER — Telehealth (INDEPENDENT_AMBULATORY_CARE_PROVIDER_SITE_OTHER): Payer: 59 | Admitting: Neurology

## 2018-10-08 ENCOUNTER — Encounter: Payer: Self-pay | Admitting: Neurology

## 2018-10-08 ENCOUNTER — Other Ambulatory Visit: Payer: Self-pay

## 2018-10-08 ENCOUNTER — Encounter: Payer: Self-pay | Admitting: *Deleted

## 2018-10-08 DIAGNOSIS — F1010 Alcohol abuse, uncomplicated: Secondary | ICD-10-CM

## 2018-10-08 DIAGNOSIS — G20 Parkinson's disease: Secondary | ICD-10-CM

## 2018-10-08 MED ORDER — CARBIDOPA-LEVODOPA 25-100 MG PO TABS: ORAL_TABLET | ORAL | 1 refills | Status: DC

## 2018-10-14 ENCOUNTER — Ambulatory Visit: Payer: 59 | Admitting: Neurology

## 2018-10-28 ENCOUNTER — Other Ambulatory Visit: Payer: 59

## 2018-10-28 ENCOUNTER — Ambulatory Visit (INDEPENDENT_AMBULATORY_CARE_PROVIDER_SITE_OTHER): Payer: 59 | Admitting: Family Medicine

## 2018-10-28 ENCOUNTER — Telehealth: Payer: Self-pay | Admitting: *Deleted

## 2018-10-28 ENCOUNTER — Encounter: Payer: Self-pay | Admitting: Family Medicine

## 2018-10-28 VITALS — Temp 100.2°F | Wt 240.0 lb

## 2018-10-28 DIAGNOSIS — Z20822 Contact with and (suspected) exposure to covid-19: Secondary | ICD-10-CM

## 2018-10-28 DIAGNOSIS — J069 Acute upper respiratory infection, unspecified: Secondary | ICD-10-CM | POA: Diagnosis not present

## 2018-10-28 DIAGNOSIS — R6889 Other general symptoms and signs: Secondary | ICD-10-CM | POA: Diagnosis not present

## 2018-10-28 NOTE — Progress Notes (Signed)
I connected with [name] [name] on 10/28/18 at  2:00 PM EDT by video and verified that I am speaking with the correct person using two identifiers.   I discussed the limitations, risks, security and privacy concerns of performing an evaluation and management service by video and the availability of in person appointments. I also discussed with the patient that there may be a patient responsible charge related to this service. The patient expressed understanding and agreed to proceed.  Patient location: Home Provider Location: [place] Participants: Lesleigh Noe and [name] [name]   Subjective:     [name] [name] is a 58 y.o. male presenting for Fever (symptoms started suddenly last night-10/27/2018, with chills, temp today 100.2, cough and some dyspnea present. Has not been exposued to anyone with COVID that he knows of.)     Fever   This is a new problem. The current episode started yesterday. The problem has been gradually improving. The maximum temperature noted was 100 to 100.9 F. Associated symptoms include congestion and coughing. Pertinent negatives include no chest pain, diarrhea, muscle aches, nausea, sore throat or vomiting. He has tried nothing for the symptoms.  Risk factors: no sick contacts    Initially thought it was due to missing his parkinson's medication.   Works at a nursing home  Review of Systems  Constitutional: Positive for chills and fever.  HENT: Positive for congestion. Negative for sore throat.   Respiratory: Positive for cough and shortness of breath.   Cardiovascular: Negative for chest pain.  Gastrointestinal: Negative for diarrhea, nausea and vomiting.     Social History   Tobacco Use  Smoking Status Never Smoker  Smokeless Tobacco Former Systems developer  Tobacco Comment   Dip Only.        Objective:   BP Readings from Last 3 Encounters:  06/03/18 128/88  01/14/18 132/80  08/21/17 100/72   Wt Readings from Last 3  Encounters:  10/28/18 240 lb (108.9 kg)  10/08/18 230 lb (104.3 kg)  06/03/18 242 lb (109.8 kg)    Temp 100.2 F (37.9 C) Comment: per patient  Wt 240 lb (108.9 kg) Comment: per patient  BMI 33.47 kg/m   Physical Exam Constitutional:      Appearance: Normal appearance. He is not ill-appearing.  HENT:     Head: Normocephalic and atraumatic.     Right Ear: External ear normal.     Left Ear: External ear normal.  Eyes:     Conjunctiva/sclera: Conjunctivae normal.  Pulmonary:     Effort: Pulmonary effort is normal. No respiratory distress.  Neurological:     Mental Status: He is alert. Mental status is at baseline.  Psychiatric:        Mood and Affect: Mood normal.        Behavior: Behavior normal.        Thought Content: Thought content normal.        Judgment: Judgment normal.          Assessment & Plan:   Problem List Items Addressed This Visit    None    Visit Diagnoses    Suspected Covid-19 Virus Infection    -  Primary   Upper respiratory tract infection, unspecified type         Stressed importance of being tested given symptoms. Referral made for drive-up testing for patient. Advised isolation until test results are back. And that he should stay out of work until 7 days since symptom  onset and at least 3 days fever free.    Return if symptoms worsen or fail to improve.  Lesleigh Noe, MD

## 2018-10-28 NOTE — Telephone Encounter (Signed)
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   Please test for Covid-19

## 2018-10-28 NOTE — Telephone Encounter (Signed)
Referred by Dr. Waunita Schooner at [facility] for Covid 19 testing due to symptoms. Appointment made for today at [place] site for 3:30p. Made aware to wear mask and stay in vehicle.

## 2018-10-29 LAB — NOVEL CORONAVIRUS, NAA: SARS-CoV-2, NAA: NOT DETECTED

## 2018-10-30 ENCOUNTER — Telehealth: Payer: Self-pay | Admitting: Internal Medicine

## 2018-10-30 NOTE — Telephone Encounter (Signed)
error 

## 2018-10-31 ENCOUNTER — Telehealth: Payer: Self-pay

## 2018-10-31 NOTE — Telephone Encounter (Signed)
Left message for patient to call back to follow up on how he is feeling since his last virtual visit on 10/28/2018

## 2018-10-31 NOTE — Telephone Encounter (Signed)
Spoke with patient. Patient feels better, not really having any symptoms lingering. No new symptoms. Patient will let us know if he needs anything else or if anything changes. FYI

## 2018-11-27 ENCOUNTER — Telehealth: Payer: Self-pay | Admitting: Neurology

## 2018-11-27 NOTE — Telephone Encounter (Signed)
Get some details for me on what he is having trouble with walking wise.  He def has PD, but he still works in a very active job.  Refuses repeat neuroimaging of spine.  Make sure bladder/bowel are okay

## 2018-11-27 NOTE — Telephone Encounter (Signed)
I will fill out but find out if he is agreeable to repeat MRI scan that we have been discussing for a long time to look at his syrinx.

## 2018-11-27 NOTE — Telephone Encounter (Signed)
Spoke with patient he would like to pick up from office tomorrow if your ok to sign form?

## 2018-11-27 NOTE — Telephone Encounter (Signed)
Called patient no answer ask to call office back to discuss repeating this scan

## 2018-11-27 NOTE — Telephone Encounter (Signed)
Pt called back he states that he will discuss MRI on next office visit

## 2018-11-27 NOTE — Telephone Encounter (Signed)
Patient was calling in about a Handicap Placard. Please call him back at [phone]. Thanks!

## 2018-11-27 NOTE — Telephone Encounter (Signed)
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 Pt bladder/ bowel are ok.

## 2018-12-18 ENCOUNTER — Other Ambulatory Visit: Payer: Self-pay | Admitting: Neurology

## 2018-12-18 NOTE — Telephone Encounter (Signed)
Requested Prescriptions   Pending Prescriptions Disp Refills  . [name] [name] 8 MG/24HR PT24 [Pharmacy Med Name: NEUPRO 8 MG/24 HR PATCH] 90 patch 1    Sig: APPLY 1 PATCH ONTO THE SKIN EVERY DAY   Rx last filled: 06/03/18 #90 1 REFILL  Pt last seen:10/08/18  Follow up appt scheduled:03/21/19

## 2019-01-27 ENCOUNTER — Encounter: Payer: 59 | Admitting: Internal Medicine

## 2019-03-03 ENCOUNTER — Telehealth: Payer: Self-pay | Admitting: Neurology

## 2019-03-03 NOTE — Telephone Encounter (Signed)
Spoke with patient he would like to discuss this medication at next appt on 03/21/19 VV He has enough patches to get him until his appt.

## 2019-03-03 NOTE — Telephone Encounter (Signed)
Spoke with patient $300 is the cost of the medication after insurance. He has used up his benefits with the co-pay cards. They will not except co-pay card.

## 2019-03-03 NOTE — Telephone Encounter (Signed)
Is $300 his copay?  Is this approved with his insurance

## 2019-03-03 NOTE — Telephone Encounter (Signed)
Patient states that he picked up a copay card for the Neupro patch and the Drug store told him all of the benefits on that card has been used. He states that he just picked that card up and needs to know what to do because the patches are about 300.00 dollars  Please call

## 2019-03-03 NOTE — Telephone Encounter (Signed)
He used to be on pramipexole, which is in the same class as neupro.  We can change back if he would like

## 2019-03-20 NOTE — Progress Notes (Signed)
Virtual Visit via Video Note The purpose of this virtual visit is to provide medical care while limiting exposure to the novel coronavirus.    Consent was obtained for video visit:  Yes.   Answered questions that patient had about telehealth interaction:  Yes.   I discussed the limitations, risks, security and privacy concerns of performing an evaluation and management service by telemedicine. I also discussed with the patient that there may be a patient responsible charge related to this service. The patient expressed understanding and agreed to proceed.  Pt location: Home Physician Location: office Name of referring provider:  [name], MD I connected with [name] [name] at patients initiation/request on 03/21/2019 at  3:30 PM EDT by video enabled telemedicine application and verified that I am speaking with the correct person using two identifiers. Pt MRN:  [id number] Pt DOB:  04/02/1961 Video Participants:  [name] [name];     History of Present Illness:  Patient seen today in follow-up for Parkinson's disease.  He is on the Neupro patch, 8 mg daily.  Rarely will give him site reactions if he puts it on the side.  However, the medication has become quite expensive for him (over $300 per month per pt today).  He called me about that.  I told him we could change back to pramipexole if he would like, which he used to be on in the past.  His carbidopa/levodopa 25/100 was increased last visit so he takes 2 tablets in the morning, 1 in the afternoon and 1 in the evening (he is actually taking 1 po qid).  He has had no falls.  No lightheadedness or near syncope.  States that he has cut "way back" in alcohol.  Drinks 8-9 vodka week.     The records that were made available to me were reviewed.  Patient called me about a handicap placard since our last visit.  Patient has a very active job with walking, so I did question him about this.  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  Told the patient I would fill this out if he was agreeable to doing an MRI to allow me to look at the syrinx that was present many years ago that he has refused repeat neuroimaging of.  Stated that he would think about it.  Asked him about it today and states that he is doing really well Having drooling but because he recently grew a beard, he doesn't notice it until he feels his beard is wet.     Observations/Objective:   Vitals:   03/21/19 1532  Weight: 235 lb (106.6 kg)  Height: 5\' 11"  (1.803 m)   GEN:  The patient appears stated age and is in NAD.  Neurological examination:  Orientation: The patient is alert and oriented x3. Cranial nerves: There is good facial symmetry. There is facial hypomimia.  The speech is fluent and clear. Soft palate rises symmetrically and there is no tongue deviation. Hearing is intact to conversational tone. Motor: Strength is at least antigravity x 4.   Shoulder shrug is equal and symmetric.  There is no pronator drift.  Movement examination: Tone: unable Abnormal movements: There is mild jaw tremor. Coordination:  There is mild  decremation with RAM's, with any form of RAMS, including alternating supination and pronation of the forearm, hand opening and closing, finger taps on the left Gait and Station: not tested (is at work  and coworkers are around)    Assessment and Plan:   1.   Parkinson's disease.  The patient has tremor, bradykinesia, mild rigidity.             -Discontinue Neupro, 8 mg, due to cost  -Start pramipexole 1 mg 3 times per day.  Discussed risk, benefits, and side effects which included but were not limited to compulsive behaviors and sleep attacks.             -Told him he did not have to take levodopa 4 times per day, but rather carbidopa/levodopa 25/100, 2 tablets in the morning, 1 in the afternoon and 1 in the evening. 2.  EtOH overuse             -Talked again about decreasing amount of  alcohol.  He states that he has cut down and is drinking about 8 vodka drinks per week. 3.  B12 deficiency             -on oral supplements.  Is taking 1051mcg daily 4.  Cervical spinal stenosis with syringomyelia             -Long discussion about this today as well as multiple prior other visits..  This really needs further investigation.  Discussed again that his last MRI was in 2014 and at that time it was recommended to do a contrasted exam and scan the thoracic region to rule out structural lesion.  We have talked about it many times and he has refused this.    Reminded him about this today, as I have every visit.  He declined repeat imagine 5.  LBP             -refused neuroimaging again today but as with the cervical spine, he states that he is thinking about it.    Follow Up Instructions: 4- 5 months  -I discussed the assessment and treatment plan with the patient. The patient was provided an opportunity to ask questions and all were answered. The patient agreed with the plan and demonstrated an understanding of the instructions.   The patient was advised to call back or seek an in-person evaluation if the symptoms worsen or if the condition fails to improve as anticipated.    Total Time spent in visit with the patient was:  25  min.   Pt understands and agrees with the plan of care outlined.     Alonza Bogus, DO

## 2019-03-21 ENCOUNTER — Encounter: Payer: Self-pay | Admitting: Neurology

## 2019-03-21 ENCOUNTER — Telehealth (INDEPENDENT_AMBULATORY_CARE_PROVIDER_SITE_OTHER): Payer: 59 | Admitting: Neurology

## 2019-03-21 ENCOUNTER — Other Ambulatory Visit: Payer: Self-pay

## 2019-03-21 VITALS — Ht 71.0 in | Wt 235.0 lb

## 2019-03-21 DIAGNOSIS — G20 Parkinson's disease: Secondary | ICD-10-CM

## 2019-03-21 MED ORDER — PRAMIPEXOLE DIHYDROCHLORIDE 1 MG PO TABS: 1.0000 mg | ORAL_TABLET | Freq: Three times a day (TID) | ORAL | 1 refills | Status: DC

## 2019-05-08 ENCOUNTER — Encounter: Payer: 59 | Admitting: Internal Medicine

## 2019-05-16 ENCOUNTER — Other Ambulatory Visit: Payer: Self-pay | Admitting: Internal Medicine

## 2019-05-24 ENCOUNTER — Other Ambulatory Visit: Payer: Self-pay | Admitting: Neurology

## 2019-06-04 ENCOUNTER — Other Ambulatory Visit: Payer: Self-pay | Admitting: Internal Medicine

## 2019-06-27 ENCOUNTER — Other Ambulatory Visit: Payer: Self-pay | Admitting: Internal Medicine

## 2019-06-30 NOTE — Telephone Encounter (Signed)
Hydrochlorothiazide was: The original prescription was discontinued on 10/28/2018 by Kris Mouton, CMA for the following reason: Duplicate. Renewing this prescription may not be appropriate

## 2019-07-17 ENCOUNTER — Encounter: Payer: 59 | Admitting: Internal Medicine

## 2019-08-12 NOTE — Progress Notes (Signed)
Assessment/Plan:   1.  Parkinsons Disease  -Continue carbidopa/levodopa 25/100, 2/1/1  -Continue pramipexole, 1 mg 3 times per day.  Discussed compulsive behaviors.    -chem, tsh, cbc today  2.  Alcohol overuse  -Discussed again the importance of weaning and ultimately discontinuing alcohol.  We can help him with resources if he would like.  3.  B12 deficiency  -Patient on supplements  4.  Cervical spinal stenosis with syringomyelia  -Patient declines further investigation.  Has not been investigated since 2014, and at that point it was recommended we do a contrasted exam and scan the thoracic region to rule out structural lesion.  That was not done, but patient has declined it.  In fairness, he has done well since that time   Subjective:   [name] [name] was seen today in follow up for Parkinsons disease.  Rotigotine was discontinued last visit, primarily because of cost.  We started pramipexole instead.  He is tolerating that well.  He denies compulsive behaviors or sleep attacks.  Alcohol is an issue, but no more of an issue than it was previously.  Asked him several times exactly how much he was drinking, but patient just could not tell me.  My previous records were reviewed prior to todays visit as well as outside records available to me. Pt denies falls.  Pt denies lightheadedness, near syncope.  No hallucinations.  Mood has been good.  He continues to work full time.  Current prescribed movement disorder medications: Carbidopa/levodopa 25/100, 2/1/1 Pramipexole, 1 mg 3 times per day (started last visit)   PREVIOUS MEDICATIONS:  rotigotine (discontinued because of cost)  ALLERGIES:   Allergies  Allergen Reactions  . Hydrocodone Nausea Only    CURRENT MEDICATIONS:  Outpatient Encounter Medications as of 08/14/2019  Medication Sig  . aspirin EC 81 MG tablet Take 81 mg by mouth daily.  . carbidopa-levodopa (SINEMET IR) 25-100 MG tablet TAKE 2 TABLETS BY MOUTH IN THE  AM,TAKE 1 TABLET IN THE AFTERNOON,TAKE 1 TABLET IN THE EVENING  . losartan-hydrochlorothiazide (HYZAAR) 100-25 MG tablet TAKE 1 TABLET BY MOUTH DAILY.  Marland Kitchen pramipexole (MIRAPEX) 1 MG tablet Take 1 tablet (1 mg total) by mouth 3 (three) times daily.  . vitamin B-12 (CYANOCOBALAMIN) 1000 MCG tablet Take 1,000 mcg by mouth daily.  . [DISCONTINUED] potassium chloride (K-DUR) 10 MEQ tablet Take 1 tablet (10 mEq total) by mouth 2 (two) times daily.  . [DISCONTINUED] ezetimibe-simvastatin (VYTORIN) 10-20 MG tablet TAKE 1 TABLET BY MOUTH AT BEDTIME. (Patient not taking: Reported on 08/14/2019)  . [DISCONTINUED] NEUPRO 8 MG/24HR PT24 APPLY 1 PATCH ONTO THE SKIN EVERY DAY (Patient not taking: Reported on 08/14/2019)   No facility-administered encounter medications on file as of 08/14/2019.    Objective:   PHYSICAL EXAMINATION:    VITALS:   Vitals:   08/14/19 1452  BP: (!) 146/82  Pulse: (!) 53  SpO2: 97%  Weight: 253 lb (114.8 kg)  Height: 5\' 11"  (1.803 m)    GEN:  The patient appears stated age and is in NAD. HEENT:  Normocephalic, atraumatic.  The mucous membranes are moist. The superficial temporal arteries are without ropiness or tenderness. CV:  Loletha Grayer.  regular Lungs:  CTAB Neck/HEME:  There are no carotid bruits bilaterally.  Neurological examination:  Orientation: The patient is alert and oriented x3. Cranial nerves: There is good facial symmetry with facial hypomimia. The speech is fluent and clear. Soft palate rises symmetrically and there is no tongue deviation. Hearing  is intact to conversational tone. Sensation: Sensation is intact to light touch throughout Motor: Strength is at least antigravity x4.  Movement examination: Tone: There is normal tone in the UE/LE Abnormal movements: none Coordination:  There is no decremation with RAM's Gait and Station: The patient has no difficulty arising out of a deep-seated chair without the use of the hands. The patient's stride length is  good but he does have a flexed posture.     Total time spent on today's visit was 30 minutes, including both face-to-face time and nonface-to-face time.  Time included that spent on review of records (prior notes available to me/labs/imaging if pertinent), discussing treatment and goals, answering patient's questions and coordinating care.  Cc:  [name], MD

## 2019-08-14 ENCOUNTER — Encounter: Payer: Self-pay | Admitting: Neurology

## 2019-08-14 ENCOUNTER — Other Ambulatory Visit: Payer: Self-pay

## 2019-08-14 ENCOUNTER — Ambulatory Visit: Payer: 59 | Admitting: Neurology

## 2019-08-14 ENCOUNTER — Other Ambulatory Visit: Payer: 59

## 2019-08-14 VITALS — BP 146/82 | HR 53 | Ht 71.0 in | Wt 253.0 lb

## 2019-08-14 DIAGNOSIS — Z5181 Encounter for therapeutic drug level monitoring: Secondary | ICD-10-CM

## 2019-08-14 DIAGNOSIS — R251 Tremor, unspecified: Secondary | ICD-10-CM | POA: Diagnosis not present

## 2019-08-14 DIAGNOSIS — G20 Parkinson's disease: Secondary | ICD-10-CM

## 2019-08-14 MED ORDER — PRAMIPEXOLE DIHYDROCHLORIDE 1 MG PO TABS: 1.0000 mg | ORAL_TABLET | Freq: Three times a day (TID) | ORAL | 1 refills | Status: DC

## 2019-08-14 NOTE — Patient Instructions (Addendum)
Medication:  START Nuplazid 34mg  Take 1 tablet once a day   Lab Work: Your provider has requested that you have labwork completed today. Please go to [facility] ([address]) on the second floor of this building before leaving the office today. You do not need to check in. If you are not called within 15 minutes please check with the front desk.   Testing/Procedures:    Follow up:  Your physician recommends that you schedule a follow-up appointment in: 5 months with Dr Tat

## 2019-08-15 LAB — COMPREHENSIVE METABOLIC PANEL
AG Ratio: 2 (calc) (ref 1.0–2.5)
ALT: 15 U/L (ref 9–46)
AST: 25 U/L (ref 10–35)
Albumin: 4.6 g/dL (ref 3.6–5.1)
Alkaline phosphatase (APISO): 29 U/L — ABNORMAL LOW (ref 35–144)
BUN: 14 mg/dL (ref 7–25)
CO2: 31 mmol/L (ref 20–32)
Calcium: 9.9 mg/dL (ref 8.6–10.3)
Chloride: 101 mmol/L (ref 98–110)
Creat: 0.91 mg/dL (ref 0.70–1.25)
Globulin: 2.3 g/dL (calc) (ref 1.9–3.7)
Glucose, Bld: 93 mg/dL (ref 65–99)
Potassium: 3.9 mmol/L (ref 3.5–5.3)
Sodium: 137 mmol/L (ref 135–146)
Total Bilirubin: 0.7 mg/dL (ref 0.2–1.2)
Total Protein: 6.9 g/dL (ref 6.1–8.1)

## 2019-08-15 LAB — CBC
HCT: 44.2 % (ref 38.5–50.0)
Hemoglobin: 15.4 g/dL (ref 13.2–17.1)
MCH: 32.8 pg (ref 27.0–33.0)
MCHC: 34.8 g/dL (ref 32.0–36.0)
MCV: 94 fL (ref 80.0–100.0)
MPV: 10.3 fL (ref 7.5–12.5)
Platelets: 180 10*3/uL (ref 140–400)
RBC: 4.7 10*6/uL (ref 4.20–5.80)
RDW: 12.2 % (ref 11.0–15.0)
WBC: 6 10*3/uL (ref 3.8–10.8)

## 2019-08-15 LAB — TSH: TSH: 1.29 mIU/L (ref 0.40–4.50)

## 2019-08-28 ENCOUNTER — Encounter: Payer: Self-pay | Admitting: *Deleted

## 2019-08-28 NOTE — Progress Notes (Addendum)
[name] [name] ([facility]) Rx #: [id number] Nuplazid 34MG  capsules   Form [facility] Electronic Prior Authorization Form (2017 NCPDP) Created 5 hours ago Sent to Plan 14 minutes ago Plan Response 14 minutes ago Submit Clinical Questions 12 minutes ago Determination Favorable 8 minutes ago  Letter received and sent to scan into his chart  08/28/2019 [place] [address] [place],  [address] Plan member ID: [id number] Case number: [id number] Prescriber name: Alonza Bogus Prescriber fax: [phone] [place] Dear [name] [name], [facility], on behalf of [employer], is responsible for reviewing pharmacy services provided to [name] members. [facility] received a request on 08/28/2019 from your prescriber for coverage of Nuplazid Cap 34mg . Your request for Nuplazid Cap 34mg  has been approved. How long does this approval last? NUPLAZID CAP 34MG , use as directed, is approved through 08/27/2020 or until coverage for the medication is no longer available under your benefit plan or the medication becomes subject to a pharmacy benefit coverage requirement, such as supply limits or notification, whichever occurs first as allowed by law. Please note: Doses/quantities above plan limits and/or maximum Food and Drug Administration (FDA) approved dosing may be subject to further review. Reviewed by: System What happens when the authorization expires? It is recommended that your prescriber contact [facility] for continued authorization before your authorization expires so that you do not experience any disruption in therapy. Thank you for your patience and understanding during the review process. If you have an active prescription for this medication, you may now fill. If you have any additional questions, please call [facility] toll-free at [phone]. Sincerely, [facility]

## 2019-09-08 ENCOUNTER — Ambulatory Visit (INDEPENDENT_AMBULATORY_CARE_PROVIDER_SITE_OTHER): Payer: 59 | Admitting: Internal Medicine

## 2019-09-08 ENCOUNTER — Encounter: Payer: Self-pay | Admitting: Internal Medicine

## 2019-09-08 ENCOUNTER — Other Ambulatory Visit: Payer: Self-pay

## 2019-09-08 VITALS — BP 110/74 | HR 62 | Temp 97.9°F | Ht 69.5 in | Wt 248.0 lb

## 2019-09-08 DIAGNOSIS — Z0000 Encounter for general adult medical examination without abnormal findings: Secondary | ICD-10-CM

## 2019-09-08 DIAGNOSIS — I10 Essential (primary) hypertension: Secondary | ICD-10-CM

## 2019-09-08 DIAGNOSIS — Z1211 Encounter for screening for malignant neoplasm of colon: Secondary | ICD-10-CM | POA: Diagnosis not present

## 2019-09-08 DIAGNOSIS — F1090 Alcohol use, unspecified, uncomplicated: Secondary | ICD-10-CM | POA: Insufficient documentation

## 2019-09-08 DIAGNOSIS — G20A1 Parkinson's disease without dyskinesia, without mention of fluctuations: Secondary | ICD-10-CM

## 2019-09-08 DIAGNOSIS — G950 Syringomyelia and syringobulbia: Secondary | ICD-10-CM

## 2019-09-08 DIAGNOSIS — F1099 Alcohol use, unspecified with unspecified alcohol-induced disorder: Secondary | ICD-10-CM | POA: Insufficient documentation

## 2019-09-08 DIAGNOSIS — G20 Parkinson's disease: Secondary | ICD-10-CM | POA: Diagnosis not present

## 2019-09-08 HISTORY — DX: Alcohol use, unspecified with unspecified alcohol-induced disorder: F10.99

## 2019-09-08 HISTORY — DX: Syringomyelia and syringobulbia: G95.0

## 2019-09-08 NOTE — Assessment & Plan Note (Signed)
Doing okay Will do FIT Discussed PSA---will defer for now (doesn't need other blood work now) Had COVID vaccine Flu vaccine in fall

## 2019-09-08 NOTE — Assessment & Plan Note (Signed)
Now with hallucinations Dr Tat is managing

## 2019-09-08 NOTE — Progress Notes (Signed)
Subjective:    Patient ID: [name] [name], male    DOB: 03-20-1961, 59 y.o.   MRN: [id number]  HPI Here for physical This visit occurred during the SARS-CoV-2 public health emergency.  Safety protocols were in place, including screening questions prior to the visit, additional usage of staff PPE, and extensive cleaning of exam room while observing appropriate contact time as indicated for disinfecting solutions.   Has recently started nuuplazid Had been having hallucinations--visual  Could hear dog's chain (even though dog died last year) Started it 1 week ago---not sure if it is helping Not distressing thus far  Still working full time Trying to stay away from ladders  Has gained weight Thinks it was related to past medication for Parkinson's Has improved and lost 12# in past 2 weeks  Current Outpatient Medications on File Prior to Visit  Medication Sig Dispense Refill  . aspirin EC 81 MG tablet Take 81 mg by mouth daily.    . carbidopa-levodopa (SINEMET IR) 25-100 MG tablet TAKE 2 TABLETS BY MOUTH IN THE AM,TAKE 1 TABLET IN THE AFTERNOON,TAKE 1 TABLET IN THE EVENING 360 tablet 1  . losartan-hydrochlorothiazide (HYZAAR) 100-25 MG tablet TAKE 1 TABLET BY MOUTH DAILY. 90 tablet 0  . NUPLAZID 34 MG CAPS Take 1 capsule by mouth daily.    . pramipexole (MIRAPEX) 1 MG tablet Take 1 tablet (1 mg total) by mouth 3 (three) times daily. 270 tablet 1  . vitamin B-12 (CYANOCOBALAMIN) 1000 MCG tablet Take 1,000 mcg by mouth daily.    . [DISCONTINUED] potassium chloride (K-DUR) 10 MEQ tablet Take 1 tablet (10 mEq total) by mouth 2 (two) times daily. 60 tablet 2   No current facility-administered medications on file prior to visit.    Allergies  Allergen Reactions  . Hydrocodone Nausea Only    Past Medical History:  Diagnosis Date  . Cancer ([place])    skin, basal cell face and abdomen  . Herniated disc, cervical   . Hyperlipidemia   . Hypertension     Past Surgical History:   Procedure Laterality Date  . BASAL CELL CARCINOMA EXCISION    . EYE SURGERY     as a child  . FINGER FRACTURE SURGERY     5th digit, left hand, x2  . HERNIA REPAIR     age 71    Family History  Problem Relation Age of Onset  . Diabetes Father   . Hypertension Father   . Heart attack Father   . Hypertension Mother     Social History   Socioeconomic History  . Marital status: Married    Spouse name: Not on file  . Number of children: 1  . Years of education: Not on file  . Highest education level: Not on file  Occupational History  . Occupation: Artist  Tobacco Use  . Smoking status: Never Smoker  . Smokeless tobacco: Former Systems developer  . Tobacco comment: Dip Only.  Substance and Sexual Activity  . Alcohol use: Yes    Alcohol/week: 0.0 standard drinks    Comment: 1/2 bottle of vodka per night  . Drug use: No  . Sexual activity: Not on file  Other Topics Concern  . 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 Difficulty of Paying Living Expenses:   Food Insecurity:   . Worried About Charity fundraiser in the Last Year:   . Arboriculturist in the Last Year:   Transportation Needs:   . Film/video editor (Medical):   Marland Kitchen Lack of Transportation (Non-Medical):   Physical Activity:   . Days of Exercise per Week:   . Minutes of Exercise per Session:   Stress:   . Feeling of Stress :   Social Connections:   . Frequency of Communication with Friends and Family:   . Frequency of Social Gatherings with Friends and Family:   . Attends Religious Services:   . Active Member of Clubs or Organizations:   . Attends Archivist  Meetings:   Marland Kitchen Marital Status:   Intimate Partner Violence:   . Fear of Current or Ex-Partner:   . Emotionally Abused:   Marland Kitchen Physically Abused:   . Sexually Abused:    Review of Systems  Constitutional: Positive for unexpected weight change. Negative for fatigue.       Wears seat belt  HENT: Negative for dental problem, hearing loss and tinnitus.        Overdue for dentist  Eyes:       Chronic vision loss in right eye--no recent change No diplopia   Respiratory: Negative for cough, chest tightness and shortness of breath.   Cardiovascular: Negative for chest pain and leg swelling.       Brief heart flutters--"like a butterfly"---seconds only  Gastrointestinal: Negative for blood in stool and constipation.       Gets indigestion from milk--avoids now  Endocrine: Negative for polydipsia and polyuria.  Genitourinary: Positive for frequency.       Some trouble with flow and dribbling---not enough for medication  Musculoskeletal: Positive for myalgias. Negative for arthralgias, back pain and joint swelling.       Soreness from his job  Skin: Negative for rash.       No suspicious skin lesions  Allergic/Immunologic: Negative for environmental allergies and immunocompromised state.  Neurological: Negative for dizziness, syncope, light-headedness and headaches.  Hematological: Negative for adenopathy. Does not bruise/bleed easily.  Psychiatric/Behavioral: Negative for dysphoric mood and sleep disturbance.       Worries about the Parkinson's       Objective:   Physical Exam  Constitutional: He appears well-developed. No distress.  HENT:  Head: Normocephalic and atraumatic.  Right Ear: External ear normal.  Left Ear: External ear normal.  Mouth/Throat: Oropharynx is clear and moist. No oropharyngeal exudate.  Eyes: Pupils are equal, round, and reactive to light. Conjunctivae are normal.  Neck: No thyromegaly present.  Cardiovascular: Normal rate, regular rhythm, normal heart sounds  and intact distal pulses. Exam reveals no gallop.  No murmur heard. Respiratory: Breath sounds normal. No respiratory distress. He has no wheezes. He has no rales.  GI: Soft. There is no abdominal tenderness.  Musculoskeletal:        General: No tenderness or edema.  Lymphadenopathy:    He has no cervical adenopathy.  Neurological:  Minimal bradykinesia No sig tremor  Skin: No rash noted. No erythema.  Psychiatric: He has a normal mood and affect. His behavior is normal.           Assessment & Plan:

## 2019-09-08 NOTE — Assessment & Plan Note (Signed)
BP Readings from Last 3 Encounters:  09/08/19 110/74  08/14/19 (!) 146/82  06/03/18 128/88   Good control on losartan/HCTZ Recent labs fine No orthostatic symptoms

## 2019-09-22 ENCOUNTER — Telehealth: Payer: Self-pay

## 2019-09-22 NOTE — Telephone Encounter (Signed)
Pt is not sure where to drop off stool specimen; advised pt to put his name and DOB on stool specimen container and leave at front door entrance in foyer in box for lab specimens. Pt voiced understanding but cannot come until 4:30 today. FYI to [place] in lab.

## 2019-09-24 ENCOUNTER — Other Ambulatory Visit (INDEPENDENT_AMBULATORY_CARE_PROVIDER_SITE_OTHER): Payer: 59

## 2019-09-24 DIAGNOSIS — Z1211 Encounter for screening for malignant neoplasm of colon: Secondary | ICD-10-CM | POA: Diagnosis not present

## 2019-09-24 LAB — FECAL OCCULT BLOOD, IMMUNOCHEMICAL: Fecal Occult Bld: NEGATIVE

## 2019-10-14 ENCOUNTER — Other Ambulatory Visit: Payer: Self-pay | Admitting: Neurology

## 2019-10-14 ENCOUNTER — Other Ambulatory Visit: Payer: Self-pay | Admitting: Internal Medicine

## 2019-10-15 NOTE — Telephone Encounter (Signed)
Rx(s) sent to pharmacy electronically.  

## 2019-10-28 ENCOUNTER — Other Ambulatory Visit: Payer: Self-pay | Admitting: Neurology

## 2019-10-28 NOTE — Telephone Encounter (Signed)
Patient called and said he's contacted his pharmacy to no avail regarding refills on his pramipexole 1 MG. He said he only has about two doses left of the medication left at this time.  [facility] [place]

## 2019-10-28 NOTE — Telephone Encounter (Signed)
Contacted pharmacy and was informed that patient has one refill left on file but they do not have enough to refill rx until after 5pm tomorrow. Requested they contact patient once rx is ready to be filled. Pharmacist agreeable. Patient notified and voiced understanding.

## 2020-03-02 NOTE — Progress Notes (Signed)
Assessment/Plan:   1.  Parkinsons Disease  -Increase carbidopa/levodopa 25/100, 2/2/2. Titration schedule given to increase over next few weeks  -decrease pramipexole slowly from 1 mg tid to 0.5 mg tid over next 3 weeks  May need to d/c in the future if continues  -discussed importance of tid (not bid) dosing of meds.  This has been a long term issue  2.  Alcohol overuse  -Patient on any discussed importance of weaning/discontinuing.  3.  Hallucinations  -on nuplazid  -weaning pramipexole as above  4.  B12 deficiency  -On oral supplementation.  5.  Cervical spinal stenosis with syringomyelia  -Declines further investigation. Subjective:   [name] [name] was seen today in follow up for Parkinsons disease.  My previous records were reviewed prior to todays visit as well as outside records available to me. Pt denies falls but having near falls.  Pt denies lightheadedness, near syncope.  Started on Nuplazid last visit for hallucinations.  Doesn't think helped.  Hallucinations worse.  Drinking alcohol daily but small amts per pt.    Current prescribed movement disorder medications: Carbidopa/levodopa 25/100, 2/1/1 (admits that while may take 4 tablets per day, doesn't always take tid) Pramipexole, 1 mg 3 times per day nuplazid (started last visit)   ALLERGIES:   Allergies  Allergen Reactions  . Hydrocodone Nausea Only    CURRENT MEDICATIONS:  Outpatient Encounter Medications as of 03/04/2020  Medication Sig  . aspirin EC 81 MG tablet Take 81 mg by mouth daily.  . carbidopa-levodopa (SINEMET IR) 25-100 MG tablet Take 2 tablets by mouth 3 (three) times daily. 5am/10am/3pm  . losartan-hydrochlorothiazide (HYZAAR) 100-25 MG tablet TAKE 1 TABLET BY MOUTH EVERY DAY  . NUPLAZID 34 MG CAPS Take 1 capsule by mouth daily.  . vitamin B-12 (CYANOCOBALAMIN) 1000 MCG tablet Take 1,000 mcg by mouth daily.  . [DISCONTINUED] carbidopa-levodopa (SINEMET IR) 25-100 MG tablet TAKE 2 TABLETS  BY MOUTH IN THE AM,TAKE 1 TABLET IN THE AFTERNOON,TAKE 1 TABLET IN THE EVENING  . [DISCONTINUED] pramipexole (MIRAPEX) 1 MG tablet Take 1 tablet (1 mg total) by mouth 3 (three) times daily.  . pramipexole (MIRAPEX) 0.5 MG tablet Take 1 tablet (0.5 mg total) by mouth 3 (three) times daily.  . [DISCONTINUED] potassium chloride (K-DUR) 10 MEQ tablet Take 1 tablet (10 mEq total) by mouth 2 (two) times daily.   No facility-administered encounter medications on file as of 03/04/2020.    Objective:   PHYSICAL EXAMINATION:    VITALS:   Vitals:   03/04/20 1533  BP: (!) 170/86  Pulse: 66  Resp: 18  SpO2: 96%  Weight: 258 lb (117 kg)  Height: 5\' 11"  (1.803 m)    GEN:  The patient appears stated age and is in NAD. HEENT:  Normocephalic, atraumatic.  The mucous membranes are moist. The superficial temporal arteries are without ropiness or tenderness. CV:  RRR Lungs:  CTAB Neck/HEME:  There are no carotid bruits bilaterally.  Neurological examination:  Orientation: The patient is alert and oriented x3. Cranial nerves: There is good facial symmetry with facial hypomimia. The speech is fluent and clear. Soft palate rises symmetrically and there is no tongue deviation. Hearing is intact to conversational tone. Sensation: Sensation is intact to light touch throughout Motor: Strength is at least antigravity x4.  Movement examination: Tone: There is mild to mod tone in the RUE Abnormal movements: there is RUE rest tremor Coordination:  There is  decremation with RAM's Gait and Station: The patient  has no difficulty arising out of a deep-seated chair without the use of the hands. The patient's stride length is decreased.  He is forward flexed.  Drags heels bilaterally.    I have reviewed and interpreted the following labs independently    Chemistry      Component Value Date/Time   NA 137 08/14/2019 1534   K 3.9 08/14/2019 1534   CL 101 08/14/2019 1534   CO2 31 08/14/2019 1534   BUN 14  08/14/2019 1534   CREATININE 0.91 08/14/2019 1534      Component Value Date/Time   CALCIUM 9.9 08/14/2019 1534   ALKPHOS 31 (L) 08/09/2017 1451   AST 25 08/14/2019 1534   ALT 15 08/14/2019 1534   BILITOT 0.7 08/14/2019 1534       Lab Results  Component Value Date   WBC 6.0 08/14/2019   HGB 15.4 08/14/2019   HCT 44.2 08/14/2019   MCV 94.0 08/14/2019   PLT 180 08/14/2019    Lab Results  Component Value Date   TSH 1.29 08/14/2019     Total time spent on today's visit was 30 minutes, including both face-to-face time and nonface-to-face time.  Time included that spent on review of records (prior notes available to me/labs/imaging if pertinent), discussing treatment and goals, answering patient's questions and coordinating care.  Cc:  [name], MD

## 2020-03-04 ENCOUNTER — Other Ambulatory Visit: Payer: Self-pay

## 2020-03-04 ENCOUNTER — Encounter: Payer: Self-pay | Admitting: Neurology

## 2020-03-04 ENCOUNTER — Ambulatory Visit: Payer: 59 | Admitting: Neurology

## 2020-03-04 VITALS — BP 170/86 | HR 66 | Resp 18 | Ht 71.0 in | Wt 258.0 lb

## 2020-03-04 DIAGNOSIS — G20 Parkinson's disease: Secondary | ICD-10-CM | POA: Diagnosis not present

## 2020-03-04 MED ORDER — PRAMIPEXOLE DIHYDROCHLORIDE 0.5 MG PO TABS: 0.5000 mg | ORAL_TABLET | Freq: Three times a day (TID) | ORAL | 1 refills | Status: DC

## 2020-03-04 MED ORDER — CARBIDOPA-LEVODOPA 25-100 MG PO TABS
2.0000 | ORAL_TABLET | Freq: Three times a day (TID) | ORAL | 1 refills | Status: DC
Start: 2020-03-04 — End: 2020-05-31

## 2020-03-04 NOTE — Patient Instructions (Addendum)
Week 1:  Take pramipexole, 0.5 mg at 5am, 1 mg at 10am, 1 mg at 3pm Take carbidopa/levodopa, 2 tablets at 5am, 1 tablet at 10am, 1 tablet at 3pm  Week 2:  Take pramipexole 0.5mg  at 5am, 0.5 mg at 10am, 1 mg at 3pm Take carbidopa/levodopa 25/100, 2 tablets at 5am, 2 at 10 am, 1 at 3 pm  Week 3:  Take pramipexole 0.5 mg, 1 tablet at 5am, 10am, 3pm Take carbidopa/levodopa 25/100, 2 tablets at 5am, 10 am, 3pm  Continue nuplazid 34 mg daily

## 2020-03-09 ENCOUNTER — Other Ambulatory Visit: Payer: Self-pay | Admitting: Neurology

## 2020-03-10 NOTE — Telephone Encounter (Signed)
Rx(s) sent to pharmacy electronically.  

## 2020-04-29 ENCOUNTER — Other Ambulatory Visit: Payer: Self-pay | Admitting: Neurology

## 2020-04-29 NOTE — Telephone Encounter (Signed)
Patient was told his refill of Pramipexole wasn't approved and was told to call the office. He would like to get it refilled. [facility] uses [facility] in [place]. Please call.

## 2020-04-30 NOTE — Telephone Encounter (Signed)
Spoke with [facility] to make sure that the patient still had one additional refill on file. He states the patients just needs to contact the pharmacy for additional refills. He states he is unsure of they the patient did not request a refill.   Spoke with patient and informed him to contact the pharmacy for additional refills. He voiced understanding.

## 2020-05-31 ENCOUNTER — Other Ambulatory Visit: Payer: Self-pay | Admitting: Neurology

## 2020-06-03 NOTE — Progress Notes (Signed)
Virtual Visit Via Video   The purpose of this virtual visit is to provide medical care while limiting exposure to the novel coronavirus.    Consent was obtained for video visit:  Yes.   Answered questions that patient had about telehealth interaction:  Yes.   I discussed the limitations, risks, security and privacy concerns of performing an evaluation and management service by telemedicine. I also discussed with the patient that there may be a patient responsible charge related to this service. The patient expressed understanding and agreed to proceed.  Pt location: Home Physician Location: office Name of referring provider:  Karie Schwalbe, MD I connected with Carleene Mains at patients initiation/request on 06/07/2020 at  2:00 PM EST by video enabled telemedicine application and verified that I am speaking with the correct person using two identifiers. Pt MRN:  [id number] Pt DOB:  [birth year] Video Participants:  Carleene Mains;  none  Assessment/Plan:   1.  Parkinsons Disease  -Continue carbidopa/levodopa 25/100, 2 tablets 3 times per day.  Importance of taking it 3 times daily was discussed.  Patient seen at 2 PM and had not taken his 10 AM dose yet.  -For now we will continue pramipexole 0.5 mg 3 times per day.  His hallucinations got much better when we decreased the dose of pramipexole, but they are not completely gone.  May have to discontinue this in the future.  2.  Hallucinations  -On Nuplazid  3.  Alcohol overuse  -Discussed weaning/discontinuing  4.  B12 deficiency  -On oral supplementation  5.  Cervical stenosis with syringomyelia  -Declines repeat MRI   Subjective:   Carleene Mains was seen today in follow up for Parkinsons disease.  My previous records were reviewed prior to todays visit as well as outside records available to me. Pt denies falls.  Pt denies lightheadedness, near syncope.  Occasional hallucinations but more illusions but much better than  previous.  Mood has been good.  Is awaiting Covid test today.  Been sick since Friday.  Pt vaccinated/boosted.    Current prescribed movement disorder medications: Carbidopa/levodopa 25/100, 2/2/2 (increased last visit) Pramipexole, 0.5 mg 3 times per day (decreased from 1 mg 3 times per day because of hallucinations) nuplazid    ALLERGIES:   Allergies  Allergen Reactions   Hydrocodone Nausea Only    CURRENT MEDICATIONS:  Outpatient Encounter Medications as of 06/07/2020  Medication Sig   aspirin EC 81 MG tablet Take 81 mg by mouth daily.   carbidopa-levodopa (SINEMET IR) 25-100 MG tablet TAKE 2 TABLETS BY MOUTH 3 (THREE) TIMES DAILY. 5AM/10AM/3PM   losartan-hydrochlorothiazide (HYZAAR) 100-25 MG tablet TAKE 1 TABLET BY MOUTH EVERY DAY   pramipexole (MIRAPEX) 0.5 MG tablet Take 1 tablet (0.5 mg total) by mouth 3 (three) times daily.   vitamin B-12 (CYANOCOBALAMIN) 1000 MCG tablet Take 1,000 mcg by mouth daily.   NUPLAZID 34 MG CAPS TAKE 1 CAPSULE BY MOUTH EVERY DAY   [DISCONTINUED] potassium chloride (K-DUR) 10 MEQ tablet Take 1 tablet (10 mEq total) by mouth 2 (two) times daily.   No facility-administered encounter medications on file as of 06/07/2020.    Objective:   PHYSICAL EXAMINATION:    VITALS:   Vitals:   06/07/20 1153  Weight: 250 lb (113.4 kg)  Height: 5\' 11"  (1.803 m)    GEN:  The patient appears stated age and is in NAD. HEENT:  Normocephalic, atraumatic.    Neurological examination:  Orientation: The patient is alert  and oriented x3. Cranial nerves: There is good facial symmetry without facial hypomimia. The speech is fluent and clear.  Hearing is intact to conversational tone. Motor: Strength is at least antigravity x4.  Movement examination: Tone: unable Abnormal movements: there is jaw tremor Coordination:  There is mild decremation with RAM's, with finger taps on the right Gait and Station:  The patient's stride length is forward flexed.  He  has decreased arm swing on the right.  I have reviewed and interpreted the following labs independently    Chemistry      Component Value Date/Time   NA 137 08/14/2019 1534   K 3.9 08/14/2019 1534   CL 101 08/14/2019 1534   CO2 31 08/14/2019 1534   BUN 14 08/14/2019 1534   CREATININE 0.91 08/14/2019 1534      Component Value Date/Time   CALCIUM 9.9 08/14/2019 1534   ALKPHOS 31 (L) 08/09/2017 1451   AST 25 08/14/2019 1534   ALT 15 08/14/2019 1534   BILITOT 0.7 08/14/2019 1534       Lab Results  Component Value Date   WBC 6.0 08/14/2019   HGB 15.4 08/14/2019   HCT 44.2 08/14/2019   MCV 94.0 08/14/2019   PLT 180 08/14/2019    Lab Results  Component Value Date   TSH 1.29 08/14/2019   Follow up Instructions      -I discussed the assessment and treatment plan with the patient. The patient was provided an opportunity to ask questions and all were answered. The patient agreed with the plan and demonstrated an understanding of the instructions.   The patient was advised to call back or seek an in-person evaluation if the symptoms worsen or if the condition fails to improve as anticipated.    Total time spent on today's visit was 20 minutes, including both face-to-face time and nonface-to-face time.  Time included that spent on review of records (prior notes available to me/labs/imaging if pertinent), discussing treatment and goals, answering patient's questions and coordinating    Cc:  [name], MD

## 2020-06-07 ENCOUNTER — Telehealth (INDEPENDENT_AMBULATORY_CARE_PROVIDER_SITE_OTHER): Payer: 59 | Admitting: Neurology

## 2020-06-07 ENCOUNTER — Telehealth (INDEPENDENT_AMBULATORY_CARE_PROVIDER_SITE_OTHER): Payer: 59 | Admitting: Family

## 2020-06-07 ENCOUNTER — Other Ambulatory Visit: Payer: Self-pay

## 2020-06-07 ENCOUNTER — Encounter: Payer: Self-pay | Admitting: Neurology

## 2020-06-07 VITALS — Ht 71.0 in | Wt 250.0 lb

## 2020-06-07 DIAGNOSIS — R059 Cough, unspecified: Secondary | ICD-10-CM | POA: Diagnosis not present

## 2020-06-07 DIAGNOSIS — G20 Parkinson's disease: Secondary | ICD-10-CM | POA: Diagnosis not present

## 2020-06-07 DIAGNOSIS — R509 Fever, unspecified: Secondary | ICD-10-CM

## 2020-06-07 MED ORDER — DOXYCYCLINE HYCLATE 100 MG PO TABS: 100.0000 mg | ORAL_TABLET | Freq: Two times a day (BID) | ORAL | 0 refills | Status: DC

## 2020-06-07 NOTE — Progress Notes (Signed)
[name] [name] is a 60 y.o. male with the following history as recorded in EpicCare:  Patient Active Problem List   Diagnosis Date Noted  . Syringomyelia ([place]) 09/08/2019  . Alcohol use, unspecified with unspecified alcohol-induced disorder ([place]) 09/08/2019  . Parkinson disease ([place]) 04/27/2016  . Tremor of right hand 06/21/2015  . Herniated disc, cervical   . Routine health maintenance 07/12/2011  . Hyperlipemia 09/18/2007  . Essential hypertension, benign 01/31/2007    Current Outpatient Medications  Medication Sig Dispense Refill  . doxycycline (VIBRA-TABS) 100 MG tablet Take 1 tablet (100 mg total) by mouth 2 (two) times daily. 20 tablet 0  . aspirin EC 81 MG tablet Take 81 mg by mouth daily.    . carbidopa-levodopa (SINEMET IR) 25-100 MG tablet TAKE 2 TABLETS BY MOUTH 3 (THREE) TIMES DAILY. 5AM/10AM/3PM 180 tablet 0  . losartan-hydrochlorothiazide (HYZAAR) 100-25 MG tablet TAKE 1 TABLET BY MOUTH EVERY DAY 90 tablet 3  . NUPLAZID 34 MG CAPS TAKE 1 CAPSULE BY MOUTH EVERY DAY 30 capsule 4  . pramipexole (MIRAPEX) 0.5 MG tablet Take 1 tablet (0.5 mg total) by mouth 3 (three) times daily. 270 tablet 1  . vitamin B-12 (CYANOCOBALAMIN) 1000 MCG tablet Take 1,000 mcg by mouth daily.     No current facility-administered medications for this visit.    Allergies: Hydrocodone  Past Medical History:  Diagnosis Date  . Cancer ([place])    skin, basal cell face and abdomen  . Herniated disc, cervical   . Hyperlipidemia   . Hypertension     Past Surgical History:  Procedure Laterality Date  . BASAL CELL CARCINOMA EXCISION    . EYE SURGERY     as a child  . FINGER FRACTURE SURGERY     5th digit, left hand, x2  . HERNIA REPAIR     age 30    Family History  Problem Relation Age of Onset  . Diabetes Father   . Hypertension Father   . Heart attack Father   . Hypertension Mother   . Parkinson's disease Maternal Grandmother     Social History   Tobacco Use  . Smoking status: Never  Smoker  . Smokeless tobacco: Former Systems developer  . Tobacco comment: Dip Only.  Substance Use Topics  . Alcohol use: Yes    Alcohol/week: 0.0 standard drinks    Comment: 1/2 bottle of vodka per night    Subjective:   I connected with [name] [name] on 06/07/20 at  1:00 PM EST by a telephone call and verified that I am speaking with the correct person using two identifiers.   I discussed the limitations of evaluation and management by telemedicine and the availability of in person appointments. The patient expressed understanding and agreed to proceed. Provider in office/ patient is at home; provider and patient are only 2 people on telephone call.   Started with cough/ congestion 4 days ago; notes that his temperature was running around 102 over the weekend; " I feel like crap." Denies any chest pain or shortness of breath; notes today temperature is down to 99-100; is fully vaccinated against COVID but no flu shot; tries to avoid cough/cold medications due to underlying Parkinson's Disease;      Objective:  There were no vitals filed for this visit.  Lungs: Respirations unlabored; voice is strong on call;  Neurologic: Alert and oriented; speech intact;  Assessment:  1. Cough   2. Fever, unspecified fever cause     Plan:  Will  schedule patient for COVID and flu testing at our office today; will go ahead and start antibiotics to cover for possible bacterial infection; increase fluids, rest and follow-up to be determined; if COVID positive, would recommend monoclonal antibody referral and patient agrees;  Time spent 8 minutes  No follow-ups on file.  Orders Placed This Encounter  Procedures  . Novel Coronavirus, NAA ([facility])    Order Specific Question:   Is this test for diagnosis or screening    Answer:   Diagnosis of ill patient    Order Specific Question:   Symptomatic for COVID-19 as defined by CDC    Answer:   Yes    Order Specific Question:   Date of Symptom Onset    Answer:    06/04/2020    Order Specific Question:   Hospitalized for COVID-19    Answer:   No    Order Specific Question:   Admitted to ICU for COVID-19    Answer:   No    Order Specific Question:   Previously tested for COVID-19    Answer:   Yes    Order Specific Question:   Resident in a congregate (group) care setting    Answer:   No    Order Specific Question:   Is the patient student?    Answer:   No    Order Specific Question:   Employed in healthcare setting    Answer:   No    Order Specific Question:   Has patient completed COVID vaccination(s) (2 doses of Pfizer/Moderna 1 dose of [name])    Answer:   Yes    Requested Prescriptions   Signed Prescriptions Disp Refills  . doxycycline (VIBRA-TABS) 100 MG tablet 20 tablet 0    Sig: Take 1 tablet (100 mg total) by mouth 2 (two) times daily.

## 2020-06-07 NOTE — Progress Notes (Signed)
Pt scheduled for covid test at 3pm.  Arrival instructions given & pt verb understanding.

## 2020-06-09 ENCOUNTER — Telehealth: Payer: Self-pay | Admitting: Internal Medicine

## 2020-06-09 NOTE — Telephone Encounter (Signed)
Patient would like a call as soon as his covid test results. Seen by Laura 01.10.22 [phone]

## 2020-06-13 NOTE — Telephone Encounter (Signed)
Pt called for lab results- Pt took Covid test last Monday and has not heard anything. Called [facility] and because there was no account number, results could not be released to MyChart- Pt negative for covid and read back given to Vickie.  Called pt back and informed of negative covid status and referred pt to [facility] web site and gave instructions how to access results. Pt verbalized understanding.

## 2020-06-27 ENCOUNTER — Other Ambulatory Visit: Payer: Self-pay | Admitting: Neurology

## 2020-06-29 NOTE — Telephone Encounter (Signed)
Rx(s) sent to pharmacy electronically.  

## 2020-07-29 ENCOUNTER — Encounter: Payer: Self-pay | Admitting: Neurology

## 2020-07-29 NOTE — Progress Notes (Signed)
[name] [name] (Key: [id number]) Nuplazid 34MG  capsules   Form [employer] Electronic Prior Authorization Form (2017 NCPDP) Created 4 hours ago Sent to Plan 1 hour ago Plan Response 1 hour ago Submit Clinical Questions 1 hour ago Determination Favorable 19 minutes ago Per the Manufacturer, NUPLAZID (pimavanserin) is available through 6 pharmacies: [facility]  These pharmacies can offer guidance on financial and prior authorization assistance. If you need additional assistance please call [phone]. Please read the Full Prescribing Information, including Boxed WARNING.  Message from plan: Request Reference Number: [id number]. NUPLAZID CAP 34MG  is approved through 07/29/2021. Your patient may now fill this prescription and it will be covered.

## 2020-08-03 ENCOUNTER — Other Ambulatory Visit: Payer: Self-pay | Admitting: Neurology

## 2020-08-03 NOTE — Telephone Encounter (Signed)
Please refill. Hes to continue the med.

## 2020-08-27 ENCOUNTER — Other Ambulatory Visit: Payer: Self-pay | Admitting: Neurology

## 2020-09-01 ENCOUNTER — Telehealth: Payer: Self-pay | Admitting: Neurology

## 2020-09-01 NOTE — Telephone Encounter (Signed)
I have no objection to this but disability starts with the social security office.  I don't get to determine if patient gets disability or not, but I would most certainly support it in him with his hallucinations and Parkinsons Disease.  He needs to make an appt at [facility] office and apply there.

## 2020-09-01 NOTE — Telephone Encounter (Signed)
Patient called in and left a message wanting to speak with someone about possibly setting up some disability.

## 2020-09-01 NOTE — Telephone Encounter (Signed)
Pt called and informed that Dr Tat has  no objection to this but disability starts with the social security office. Dr Tat does not get to determine if patient gets disability or not, but she would most certainly support it in him with his hallucinations and Parkinsons Disease.  He needs to make an appt at [facility] office and apply there. Pt verbalized understanding and stated that he would call SSI to get it started

## 2020-09-13 ENCOUNTER — Encounter: Payer: 59 | Admitting: Internal Medicine

## 2020-09-20 ENCOUNTER — Other Ambulatory Visit: Payer: Self-pay

## 2020-09-20 ENCOUNTER — Encounter: Payer: Self-pay | Admitting: Internal Medicine

## 2020-09-20 ENCOUNTER — Ambulatory Visit (INDEPENDENT_AMBULATORY_CARE_PROVIDER_SITE_OTHER): Payer: 59 | Admitting: Internal Medicine

## 2020-09-20 VITALS — BP 104/78 | HR 66 | Temp 97.3°F | Ht 69.5 in | Wt 261.0 lb

## 2020-09-20 DIAGNOSIS — Z0000 Encounter for general adult medical examination without abnormal findings: Secondary | ICD-10-CM

## 2020-09-20 DIAGNOSIS — I10 Essential (primary) hypertension: Secondary | ICD-10-CM

## 2020-09-20 DIAGNOSIS — G20 Parkinson's disease: Secondary | ICD-10-CM

## 2020-09-20 DIAGNOSIS — Z1211 Encounter for screening for malignant neoplasm of colon: Secondary | ICD-10-CM

## 2020-09-20 NOTE — Progress Notes (Signed)
Subjective:    Patient ID: [name] [name], male    DOB: 09/09/1960, 60 y.o.   MRN: [id number]  HPI Here for physical This visit occurred during the SARS-CoV-2 public health emergency.  Safety protocols were in place, including screening questions prior to the visit, additional usage of staff PPE, and extensive cleaning of exam room while observing appropriate contact time as indicated for disinfecting solutions.   Medication for the hallucinations seems to be making him more stiff Not clear it is even helping Did discuss with Dr Tat  Did fall once--at home Scraped up shoulder--nothing serious  Parkinson's is controlled reasonably now Still working full time   Still on BP med No problems with that  Current Outpatient Medications on File Prior to Visit  Medication Sig Dispense Refill  . aspirin EC 81 MG tablet Take 81 mg by mouth daily.    . carbidopa-levodopa (SINEMET IR) 25-100 MG tablet TAKE 2 TABLETS BY MOUTH 3 (THREE) TIMES DAILY. 5AM/10AM/3PM 180 tablet 3  . losartan-hydrochlorothiazide (HYZAAR) 100-25 MG tablet TAKE 1 TABLET BY MOUTH EVERY DAY 90 tablet 3  . NUPLAZID 34 MG CAPS TAKE 1 CAPSULE BY MOUTH EVERY DAY 30 capsule 2  . pramipexole (MIRAPEX) 0.5 MG tablet TAKE 1 TABLET BY MOUTH 3 TIMES DAILY. 270 tablet 0  . vitamin B-12 (CYANOCOBALAMIN) 1000 MCG tablet Take 1,000 mcg by mouth daily.    . [DISCONTINUED] potassium chloride (K-DUR) 10 MEQ tablet Take 1 tablet (10 mEq total) by mouth 2 (two) times daily. 60 tablet 2   No current facility-administered medications on file prior to visit.    Allergies  Allergen Reactions  . Hydrocodone Nausea Only    Past Medical History:  Diagnosis Date  . Cancer ([place])    skin, basal cell face and abdomen  . Herniated disc, cervical   . Hyperlipidemia   . Hypertension     Past Surgical History:  Procedure Laterality Date  . BASAL CELL CARCINOMA EXCISION    . EYE SURGERY     as a child  . FINGER FRACTURE SURGERY     5th  digit, left hand, x2  . HERNIA REPAIR     age 48    Family History  Problem Relation Age of Onset  . Diabetes Father   . Hypertension Father   . Heart attack Father   . Hypertension Mother   . Parkinson's disease Maternal Grandmother     Social History   Socioeconomic History  . Marital status: Married    Spouse name: Not on file  . Number of children: 1  . Years of education: Not on file  . Highest education level: Not on file  Occupational History  . Occupation: Artist  Tobacco Use  . Smoking status: Never Smoker  . Smokeless tobacco: Former Systems developer  . Tobacco comment: Dip Only.  Vaping Use  . Vaping Use: Never used  Substance and Sexual Activity  . Alcohol use: Yes    Alcohol/week: 0.0 standard drinks    Comment: 1/2 bottle of vodka per night  . Drug use: No  . Sexual activity: Not on file  Other Topics Concern  . 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        Does hear soft buzz in the quiet Overdue for dentist  Eyes: Negative for visual disturbance.       Does have some double vision--only at certain angle (like when driving) Has history of strabismus (so will close weaker eye)  Respiratory: Negative for cough, chest tightness and shortness of breath.    Cardiovascular: Positive for palpitations. Negative for chest pain and leg swelling.       Slight "butterfly" sense in chest  Gastrointestinal: Negative for blood in stool and constipation.       Rare heartburn---tums helps  Endocrine: Negative for polydipsia and polyuria.  Genitourinary:       Some dribbling--not bad No sexual problems  Musculoskeletal: Positive for arthralgias and back pain. Negative for joint swelling.       No meds for this  Skin: Negative for rash.  Allergic/Immunologic: Negative for environmental allergies and immunocompromised state.  Neurological: Negative for dizziness, syncope, light-headedness and headaches.  Hematological: Negative for adenopathy. Bruises/bleeds easily.  Psychiatric/Behavioral: Negative for dysphoric mood and sleep disturbance. The patient is nervous/anxious.        Worries about Parkinson's---and wife worries about it even more       Objective:   Physical Exam Constitutional:      Appearance: Normal appearance.  HENT:     Right Ear: Tympanic membrane, ear canal and external ear normal.     Left Ear: Tympanic membrane, ear canal and external ear normal.     Mouth/Throat:     Pharynx: No oropharyngeal exudate or posterior oropharyngeal erythema.  Eyes:     Conjunctiva/sclera: Conjunctivae normal.     Pupils: Pupils are equal, round, and reactive to light.  Cardiovascular:     Rate and Rhythm: Normal rate and regular rhythm.     Pulses: Normal pulses.     Heart sounds: No murmur heard. No gallop.   Pulmonary:     Effort: Pulmonary effort is normal.     Breath sounds: Normal breath sounds. No wheezing or rales.  Abdominal:     Palpations: Abdomen is soft.     Tenderness: There is no abdominal tenderness.  Musculoskeletal:     Cervical back: Neck supple.     Right lower leg: No edema.     Left lower leg: No edema.  Lymphadenopathy:     Cervical: No cervical adenopathy.  Skin:    General: Skin is warm.     Findings: No rash.      Comments: Multiple benign pigmented nevi---recommended yearly derm evaluation  Neurological:     Mental Status: He is alert and oriented to person, place, and time.     Comments: Mild bradykinesia  Psychiatric:        Mood and Affect: Mood normal.        Behavior: Behavior normal.            Assessment & Plan:

## 2020-09-20 NOTE — Assessment & Plan Note (Signed)
BP Readings from Last 3 Encounters:  09/20/20 104/78  03/04/20 (!) 170/86  09/08/19 110/74   Good control on losartan/HCTZ No orthostatic symptoms---would cut the dose if this starts

## 2020-09-20 NOTE — Assessment & Plan Note (Signed)
Fairly satisfied with the Parkinsons but not happy with the nuplazid Does have scheduled follow up with Dr Carles Collet

## 2020-09-20 NOTE — Assessment & Plan Note (Signed)
Discussed fitness FIT Discussed PSA--he wishes to defer He will consider shingrix Had COVID booster Flu vaccine in the fall

## 2020-09-21 ENCOUNTER — Telehealth: Payer: Self-pay | Admitting: Neurology

## 2020-09-21 LAB — COMPREHENSIVE METABOLIC PANEL
ALT: 19 U/L (ref 0–53)
AST: 26 U/L (ref 0–37)
Albumin: 4.3 g/dL (ref 3.5–5.2)
Alkaline Phosphatase: 32 U/L — ABNORMAL LOW (ref 39–117)
BUN: 24 mg/dL — ABNORMAL HIGH (ref 6–23)
CO2: 29 mEq/L (ref 19–32)
Calcium: 9.2 mg/dL (ref 8.4–10.5)
Chloride: 102 mEq/L (ref 96–112)
Creatinine, Ser: 1.08 mg/dL (ref 0.40–1.50)
GFR: 74.02 mL/min (ref >60.00)
Glucose, Bld: 117 mg/dL — ABNORMAL HIGH (ref 70–99)
Potassium: 3.4 mEq/L — ABNORMAL LOW (ref 3.5–5.1)
Sodium: 138 mEq/L (ref 135–145)
Total Bilirubin: 0.7 mg/dL (ref 0.2–1.2)
Total Protein: 7.5 g/dL (ref 6.0–8.3)

## 2020-09-21 LAB — CBC
HCT: 44.4 % (ref 39.0–52.0)
Hemoglobin: 15.2 g/dL (ref 13.0–17.0)
MCHC: 34.2 g/dL (ref 30.0–36.0)
MCV: 95.4 fl (ref 78.0–100.0)
Platelets: 188 10*3/uL (ref 150.0–400.0)
RBC: 4.65 Mil/uL (ref 4.22–5.81)
RDW: 12.8 % (ref 11.5–15.5)
WBC: 7.4 10*3/uL (ref 4.0–10.5)

## 2020-09-21 LAB — VITAMIN B12: Vitamin B-12: 246 pg/mL (ref 211–911)

## 2020-09-21 NOTE — Telephone Encounter (Signed)
PCP contacted me and nuplazid not helping.  Pt not been on it that long but will make follow up in next 6-8 weeks to see how doing.  Hinton Dyer, put on cx list

## 2020-09-21 NOTE — Telephone Encounter (Signed)
Patient is on your sch for November 09, 2020. I have also placed him on the wait list

## 2020-09-24 NOTE — Progress Notes (Signed)
Assessment/Plan:   1.  Parkinsons Disease             -Increase carbidopa/levodopa 25/100, 2 tablets at 6 AM/9:30 AM/1 PM/4:30 PM.   This will be especially needed as we will wean off of the pramipexole.             -I am going to wean the patient off of pramipexole because of hallucinations.  -agree with patient that difficult to have his physical job with his degree of Parkinsons Disease.  Support disability  2.  Hallucinations             -On Nuplazid.  I may have to change this out for quetiapine, but want to see how the hallucinations do after I get him off of the pramipexole.  3.  Alcohol overuse             -Long discussion again with the patient regarding the fact that he really needs to get rid of the alcohol.  We have discussed this for years, but with the increasing hallucinations at this very young age, he really needs to stop using alcohol.  It is difficult to get out of him how much he drinks per night, just states that he drinks much less than he used to.  He is drinking orange juice and vodka.  4.  B12 deficiency             -On oral supplementation but recent labs showed it was still low and I wonder if he is taking the supplement regularly.  He states today he thought that he was on it but wife told him he was not  5.  Cervical stenosis with syringomyelia             -Declines repeat MRI  6.  I will plan on seeing the patient back in the next 6 to 8 weeks.  Subjective:   [name] [name] was seen today in follow up for Parkinsons disease.  My previous records were reviewed prior to todays visit as well as outside records available to me.  I have communicated with the patient's primary care physician since last visit.  Patient has not been doing well.  He has had continued hallucinations, despite Nuplazid.  He often sees a faceless little person that wears a red shirt and black pants. It never speaks.  Sometimes there are more than 1.  It happens day and night.  He  can tell its not real.  He had 1 fall at home - was outside on uneven ground.  He was able to get self up.  He just had lab work at primary care a few days ago and his B12 level was low again (had been raised up with supplementation).  Memory getting worse.  States that work wants to get rid of him b/c he is slow  Current prescribed movement disorder medications: Carbidopa/levodopa 25/100, 2 tablets 3 times per day Pramipexole 0.5 mg 3 times per day Nuplazid   PREVIOUS MEDICATIONS: Wean pramipexole  ALLERGIES:   Allergies  Allergen Reactions  . Hydrocodone Nausea Only    CURRENT MEDICATIONS:  Outpatient Encounter Medications as of 09/27/2020  Medication Sig  . aspirin EC 81 MG tablet Take 81 mg by mouth daily.  . carbidopa-levodopa (SINEMET IR) 25-100 MG tablet TAKE 2 TABLETS BY MOUTH 3 (THREE) TIMES DAILY. 5AM/10AM/3PM  . losartan-hydrochlorothiazide (HYZAAR) 100-25 MG tablet TAKE 1 TABLET BY MOUTH EVERY DAY  . NUPLAZID 34 MG CAPS  TAKE 1 CAPSULE BY MOUTH EVERY DAY  . pramipexole (MIRAPEX) 0.5 MG tablet TAKE 1 TABLET BY MOUTH 3 TIMES DAILY.  . vitamin B-12 (CYANOCOBALAMIN) 1000 MCG tablet Take 1,000 mcg by mouth daily.  . [DISCONTINUED] potassium chloride (K-DUR) 10 MEQ tablet Take 1 tablet (10 mEq total) by mouth 2 (two) times daily.   No facility-administered encounter medications on file as of 09/27/2020.    Objective:   PHYSICAL EXAMINATION:    VITALS:   Vitals:   09/27/20 1338  BP: 110/86  Pulse: 89  SpO2: 94%  Weight: 264 lb (119.7 kg)  Height: 5\' 11"  (1.803 m)    GEN:  The patient appears stated age and is in NAD. HEENT:  Normocephalic, atraumatic.  The mucous membranes are moist. The superficial temporal arteries are without ropiness or tenderness. CV:  RRR Lungs:  CTAB Neck/HEME:  There are no carotid bruits bilaterally. Musculoskeletal: Patient is side bent to the right and leans over to the right as he sits in the chair  Neurological  examination:  Orientation: The patient is alert and oriented x3. Cranial nerves: There is good facial symmetry with facial hypomimia. The speech is fluent and clear. Soft palate rises symmetrically and there is no tongue deviation. Hearing is intact to conversational tone. Sensation: Sensation is intact to light touch throughout Motor: Strength is at least antigravity x4.  Movement examination: Tone: There is moderate increased tone in the right upper extremity Abnormal movements: there is occ/rare RUE tremor Coordination:  There is  decremation with RAM's, with any form of RAMS, including alternating supination and pronation of the forearm, hand opening and closing, finger taps, heel taps and toe taps on the right Gait and Station: The patient has no difficulty arising out of a deep-seated chair without the use of the hands. The patient's stride length is good, but he does have some decreased stride length and is forward flexed.    I have reviewed and interpreted the following labs independently    Chemistry      Component Value Date/Time   NA 138 09/20/2020 1557   K 3.4 (L) 09/20/2020 1557   CL 102 09/20/2020 1557   CO2 29 09/20/2020 1557   BUN 24 (H) 09/20/2020 1557   CREATININE 1.08 09/20/2020 1557   CREATININE 0.91 08/14/2019 1534      Component Value Date/Time   CALCIUM 9.2 09/20/2020 1557   ALKPHOS 32 (L) 09/20/2020 1557   AST 26 09/20/2020 1557   ALT 19 09/20/2020 1557   BILITOT 0.7 09/20/2020 1557       Lab Results  Component Value Date   WBC 7.4 09/20/2020   HGB 15.2 09/20/2020   HCT 44.4 09/20/2020   MCV 95.4 09/20/2020   PLT 188.0 09/20/2020    Lab Results  Component Value Date   TSH 1.29 08/14/2019     Total time spent on today's visit was 30 minutes, including both face-to-face time and nonface-to-face time.  Time included that spent on review of records (prior notes available to me/labs/imaging if pertinent), discussing treatment and goals, answering  patient's questions and coordinating care.  Cc:  [name], MD

## 2020-09-27 ENCOUNTER — Encounter: Payer: Self-pay | Admitting: Neurology

## 2020-09-27 ENCOUNTER — Ambulatory Visit: Payer: 59 | Admitting: Neurology

## 2020-09-27 ENCOUNTER — Other Ambulatory Visit: Payer: Self-pay

## 2020-09-27 VITALS — BP 110/86 | HR 89 | Ht 71.0 in | Wt 264.0 lb

## 2020-09-27 DIAGNOSIS — R441 Visual hallucinations: Secondary | ICD-10-CM | POA: Diagnosis not present

## 2020-09-27 DIAGNOSIS — G20 Parkinson's disease: Secondary | ICD-10-CM | POA: Diagnosis not present

## 2020-09-27 MED ORDER — PRAMIPEXOLE DIHYDROCHLORIDE 0.5 MG PO TABS: ORAL_TABLET | ORAL | 0 refills | Status: DC

## 2020-09-27 MED ORDER — CARBIDOPA-LEVODOPA 25-100 MG PO TABS: 2.0000 | ORAL_TABLET | Freq: Four times a day (QID) | ORAL | 3 refills | Status: DC

## 2020-09-27 NOTE — Patient Instructions (Signed)
Week 1: Decrease pramipexole 0.5 mg, 1 in the AM and 1 in the PM Take carbidopa/levodopa 25/100, 2 at 6am/9:30am/1pm/4:30-5pm  Week 2: Decrease pramipexole 0.5 mg, 1 in the AM only Take carbidopa/levodopa 25/100, 2 at 6am/9:30am/1pm/4:30-5pm  Week 3 and beyond: STOP pramipexole Take carbidopa/levodopa 25/100, 2 at 6am/9:30am/1pm/4:30-5pm

## 2020-09-29 ENCOUNTER — Other Ambulatory Visit: Payer: Self-pay

## 2020-09-29 ENCOUNTER — Encounter: Payer: Self-pay | Admitting: Registered Nurse

## 2020-09-29 ENCOUNTER — Telehealth: Payer: 59 | Admitting: Registered Nurse

## 2020-09-29 ENCOUNTER — Telehealth: Payer: Self-pay

## 2020-09-29 DIAGNOSIS — U071 COVID-19: Secondary | ICD-10-CM | POA: Diagnosis not present

## 2020-09-29 MED ORDER — GUAIFENESIN-CODEINE 100-10 MG/5ML PO SOLN: 5.0000 mL | Freq: Every evening | ORAL | 0 refills | Status: DC | PRN

## 2020-09-29 MED ORDER — BENZONATATE 200 MG PO CAPS
200.0000 mg | ORAL_CAPSULE | Freq: Two times a day (BID) | ORAL | 0 refills | Status: DC | PRN
Start: 2020-09-29 — End: 2021-10-28

## 2020-09-29 NOTE — Progress Notes (Signed)
Telemedicine Encounter- SOAP NOTE Established Patient  This telephone encounter was conducted with the patient's (or proxy's) verbal consent via audio telecommunications: yes  Patient was instructed to have this encounter in a suitably private space; and to only have persons present to whom they give permission to participate. In addition, patient identity was confirmed by use of name plus two identifiers (DOB and address).  I discussed the limitations, risks, security and privacy concerns of performing an evaluation and management service by telephone and the availability of in person appointments. I also discussed with the patient that there may be a patient responsible charge related to this service. The patient expressed understanding and agreed to proceed.  I spent a total of 15 minutes talking with the patient or their proxy.  Patient at home Provider in office  Participants: Kathrin Ruddy, NP and Tanja Port Chief Complaint  Patient presents with  . Covid Positive    Patient states he tested positive for covid for lastnight. Per patient he has been having a congestion, a cough, and a fever. He has been taking some otc cough syrup with no relief.    Subjective   [name] [name] is a 60 y.o. established patient. Telephone visit today for covid +  HPI Symptoms onset yesterday Coughing and wheezing, malaise and aches Has taken OTC cough medicine with some effect.  A little bit of shortness of breath, but nothing major No fevers, chills, fatigue but has been sweating No nvd No chest pain   Hx of parkinson disease. On sinemet.  Interested in infusion  Has rec'd both doses of Moderna and booster.   Patient Active Problem List   Diagnosis Date Noted  . Syringomyelia ([place]) 09/08/2019  . Alcohol use, unspecified with unspecified alcohol-induced disorder ([place]) 09/08/2019  . Parkinson disease ([place]) 04/27/2016  . Tremor of right hand 06/21/2015  . Herniated disc, cervical    . Routine health maintenance 07/12/2011  . Hyperlipemia 09/18/2007  . Essential hypertension, benign 01/31/2007    Past Medical History:  Diagnosis Date  . Cancer ([place])    skin, basal cell face and abdomen  . Herniated disc, cervical   . Hyperlipidemia   . Hypertension     Current Outpatient Medications  Medication Sig Dispense Refill  . aspirin EC 81 MG tablet Take 81 mg by mouth daily.    . benzonatate (TESSALON) 200 MG capsule Take 1 capsule (200 mg total) by mouth 2 (two) times daily as needed for cough. 20 capsule 0  . carbidopa-levodopa (SINEMET IR) 25-100 MG tablet Take 2 tablets by mouth 4 (four) times daily. 6am/9:30am/1pm/4:30-5pm 720 tablet 3  . guaiFENesin-codeine 100-10 MG/5ML syrup Take 5 mLs by mouth at bedtime as needed for cough. 120 mL 0  . losartan-hydrochlorothiazide (HYZAAR) 100-25 MG tablet TAKE 1 TABLET BY MOUTH EVERY DAY 90 tablet 3  . NUPLAZID 34 MG CAPS TAKE 1 CAPSULE BY MOUTH EVERY DAY 30 capsule 2  . pramipexole (MIRAPEX) 0.5 MG tablet Take 1 tablet (0.5 mg total) by mouth in the morning and at bedtime for 7 days, THEN 1 tablet (0.5 mg total) daily for 7 days. 270 tablet 0  . vitamin B-12 (CYANOCOBALAMIN) 1000 MCG tablet Take 1,000 mcg by mouth daily.     No current facility-administered medications for this visit.    Allergies  Allergen Reactions  . Hydrocodone Nausea Only    Social History   Socioeconomic History  . Marital status: Married    Spouse name: Not on  file  . Number of children: 1  . Years of education: Not on file  . Highest education level: Not on file  Occupational History  . Occupation: Artist  Tobacco Use  . Smoking status: Never Smoker  . Smokeless tobacco: Former Systems developer  . Tobacco comment: Dip Only.  Vaping Use  . Vaping Use: Never used  Substance and Sexual Activity  . Alcohol use: Yes    Alcohol/week: 0.0 standard drinks    Comment: 1/2 bottle of vodka per night  . Drug use: No  . Sexual  activity: Not on file  Other Topics Concern  . 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  [name] was seen today for covid positive.  Diagnoses and all orders for this visit:  COVID-19 -     Ambulatory referral for Covid Treatment -     benzonatate (TESSALON) 200 MG capsule; Take 1 capsule (200 mg total) by mouth 2 (two) times daily as needed for cough. -     guaiFENesin-codeine 100-10 MG/5ML syrup; Take 5 mLs by mouth at bedtime as needed for cough.   PLAN  Tessalon and cough syrup for relief  Refer to infusion treatment. Pt has significant comorbidities of parkinson disease and syringomyelia, as well as htn, etoh use disorder, and hld.  Discussed ER precautions, pt voices understanding  Patient encouraged to call clinic with any questions, comments, or concerns.   I discussed the assessment and treatment plan with the patient. The patient was provided an opportunity to ask questions and all were answered. The patient agreed with the plan and demonstrated an understanding of the instructions.   The patient was advised to call back or seek an in-person evaluation if the symptoms worsen or if the condition fails to improve as anticipated.  I provided 15 minutes of non-face-to-face time during this encounter.  Maximiano Coss, NP  Primary Care at [facility]

## 2020-09-29 NOTE — Telephone Encounter (Signed)
Called patient back and explained that it could take up to two days before they get him scheduled for an infusion. [name] [name] states he would like to go with the covid pills so he doesn't have to wait.

## 2020-09-29 NOTE — Patient Instructions (Signed)
° ° ° °  If you have lab work done today you will be contacted with your lab results within the next 2 weeks.  If you have not heard from us then please contact us. The fastest way to get your results is to register for My Chart. ° ° °IF you received an x-ray today, you will receive an invoice from [facility]. Please contact [facility] at [phone] with questions or concerns regarding your invoice.  ° °IF you received labwork today, you will receive an invoice from [facility]. Please contact [facility] at [phone] with questions or concerns regarding your invoice.  ° °Our billing staff will not be able to assist you with questions regarding bills from these companies. ° °You will be contacted with the lab results as soon as they are available. The fastest way to get your results is to activate your My Chart account. Instructions are located on the last page of this paperwork. If you have not heard from us regarding the results in 2 weeks, please contact this office. °  ° ° ° °

## 2020-09-29 NOTE — Telephone Encounter (Signed)
Patient is calling in stating that he is supposed to be scheduled for an infusion at the hospital and is wondering who will reach out to him to get something scheduled.

## 2020-09-30 ENCOUNTER — Other Ambulatory Visit: Payer: Self-pay | Admitting: Physician Assistant

## 2020-09-30 ENCOUNTER — Other Ambulatory Visit: Payer: Self-pay | Admitting: Registered Nurse

## 2020-09-30 DIAGNOSIS — G20 Parkinson's disease: Secondary | ICD-10-CM

## 2020-09-30 DIAGNOSIS — U071 COVID-19: Secondary | ICD-10-CM

## 2020-09-30 DIAGNOSIS — G950 Syringomyelia and syringobulbia: Secondary | ICD-10-CM

## 2020-09-30 DIAGNOSIS — F1099 Alcohol use, unspecified with unspecified alcohol-induced disorder: Secondary | ICD-10-CM

## 2020-09-30 DIAGNOSIS — I10 Essential (primary) hypertension: Secondary | ICD-10-CM

## 2020-09-30 MED ORDER — MOLNUPIRAVIR EUA 200MG CAPSULE: 4.0000 | ORAL_CAPSULE | Freq: Two times a day (BID) | ORAL | 0 refills | Status: AC

## 2020-09-30 NOTE — Telephone Encounter (Signed)
Please see if this is being addressed soon

## 2020-09-30 NOTE — Telephone Encounter (Signed)
Working on contacting the infusion center. Number provided is no longer correct number. [facility] sent for further information concerning another number to f/u on treatment for pt. Cardell Peach, RN is f/u on this case

## 2020-09-30 NOTE — Telephone Encounter (Signed)
Sent ? ?Thanks, ? ?Rich

## 2020-09-30 NOTE — Telephone Encounter (Signed)
Pt called because he hasn't heard anything regarding Covid infusion and given his Parkinson he was advise he should get the infusion asap. Pt hasn't heard anything regarding getting it scheduled so he called our office for help.   Pt advise message will be sent to the nurses at our office to see what the status is on his infusions and f/u back up with the pt directly.   CB# [phone]

## 2020-09-30 NOTE — Telephone Encounter (Signed)
Spoke with Lanelle Bal, Glass blower/designer at [employer] who stated that they would be reaching out to patient in the morning. Kenney Houseman will be handling this and her number is [phone].

## 2020-09-30 NOTE — Telephone Encounter (Signed)
We should plan Rx for paxlovid for him as well if he doesn't get the immunoglobulin infusion

## 2020-09-30 NOTE — Progress Notes (Signed)
I connected by phone with [name] [name] on 09/30/2020 at 5:14 PM to discuss the potential use of a new treatment for mild to moderate COVID-19 viral infection in non-hospitalized patients.  This patient is a 60 y.o. male that meets the FDA criteria for Emergency Use Authorization of COVID monoclonal antibody bebtelovimab.  Has a (+) direct SARS-CoV-2 viral test result  Has mild or moderate COVID-19   Is NOT hospitalized due to COVID-19  Is within 10 days of symptom onset  Has at least one of the high risk factor(s) for progression to severe COVID-19 and/or hospitalization as defined in EUA.  Specific high risk criteria : Older age (>/= 62 yo), BMI > 25, Cardiovascular disease or hypertension and Other high risk medical condition per CDC:  parkinsons   I have spoken and communicated the following to the patient or parent/caregiver regarding COVID monoclonal antibody treatment:  1. FDA has authorized the emergency use for the treatment of mild to moderate COVID-19 in adults and pediatric patients with positive results of direct SARS-CoV-2 viral testing who are 34 years of age and older weighing at least 40 kg, and who are at high risk for progressing to severe COVID-19 and/or hospitalization.  2. The significant known and potential risks and benefits of COVID monoclonal antibody, and the extent to which such potential risks and benefits are unknown.  3. Information on available alternative treatments and the risks and benefits of those alternatives, including clinical trials.  4. Patients treated with COVID monoclonal antibody should continue to self-isolate and use infection control measures (e.g., wear mask, isolate, social distance, avoid sharing personal items, clean and disinfect "high touch" surfaces, and frequent handwashing) according to CDC guidelines.   5. The patient or parent/caregiver has the option to accept or refuse COVID monoclonal antibody treatment.  6. Discussion about  the monoclonal antibody infusion does not ensure treatment. The patient will be placed on a list and scheduled according to risk, symptom onset and availability. A scheduler will reach to the patient to let them know if we can accommodate their infusion or not.  After reviewing this information with the patient, the patient has agreed to receive one of the available covid 19 monoclonal antibodies and will be provided an appropriate fact sheet prior to infusion. Angelena Form, PA-C 09/30/2020 5:14 PM

## 2020-10-01 ENCOUNTER — Other Ambulatory Visit: Payer: Self-pay

## 2020-10-01 ENCOUNTER — Ambulatory Visit (INDEPENDENT_AMBULATORY_CARE_PROVIDER_SITE_OTHER): Payer: 59

## 2020-10-01 DIAGNOSIS — U071 COVID-19: Secondary | ICD-10-CM | POA: Diagnosis not present

## 2020-10-01 DIAGNOSIS — I10 Essential (primary) hypertension: Secondary | ICD-10-CM

## 2020-10-01 DIAGNOSIS — F1099 Alcohol use, unspecified with unspecified alcohol-induced disorder: Secondary | ICD-10-CM

## 2020-10-01 DIAGNOSIS — G20 Parkinson's disease: Secondary | ICD-10-CM

## 2020-10-01 DIAGNOSIS — G950 Syringomyelia and syringobulbia: Secondary | ICD-10-CM

## 2020-10-01 MED ORDER — EPINEPHRINE 0.3 MG/0.3ML IJ SOAJ: 0.3000 mg | Freq: Once | INTRAMUSCULAR | Status: AC | PRN

## 2020-10-01 MED ORDER — BEBTELOVIMAB 175 MG/2 ML IV (EUA)
175.0000 mg | Freq: Once | INTRAMUSCULAR | Status: AC
  Administered 2020-10-01: 175 mg via INTRAVENOUS

## 2020-10-01 MED ORDER — METHYLPREDNISOLONE SODIUM SUCC 125 MG IJ SOLR: 125.0000 mg | Freq: Once | INTRAMUSCULAR | Status: AC | PRN

## 2020-10-01 MED ORDER — SODIUM CHLORIDE 0.9 % IV SOLN: INTRAVENOUS | Status: DC | PRN

## 2020-10-01 MED ORDER — ALBUTEROL SULFATE HFA 108 (90 BASE) MCG/ACT IN AERS: 2.0000 | INHALATION_SPRAY | Freq: Once | RESPIRATORY_TRACT | Status: AC | PRN

## 2020-10-01 MED ORDER — DIPHENHYDRAMINE HCL 50 MG/ML IJ SOLN: 50.0000 mg | Freq: Once | INTRAMUSCULAR | Status: AC | PRN

## 2020-10-01 MED ORDER — FAMOTIDINE IN NACL 20-0.9 MG/50ML-% IV SOLN: 20.0000 mg | Freq: Once | INTRAVENOUS | Status: AC | PRN

## 2020-10-01 NOTE — Telephone Encounter (Signed)
Had sent molnupiravir to his pharmacy yesterday. Had sent referral to infusion after telemed visit earlier this week.   Thank you  Rich

## 2020-10-01 NOTE — Patient Instructions (Signed)
10 Things You Can Do to Manage Your COVID-19 Symptoms at Home If you have possible or confirmed COVID-19: 1. Stay home except to get medical care. 2. Monitor your symptoms carefully. If your symptoms get worse, call your healthcare provider immediately. 3. Get rest and stay hydrated. 4. If you have a medical appointment, call the healthcare provider ahead of time and tell them that you have or may have COVID-19. 5. For medical emergencies, call 911 and notify the dispatch personnel that you have or may have COVID-19. 6. Cover your cough and sneezes with a tissue or use the inside of your elbow. 7. Wash your hands often with soap and water for at least 20 seconds or clean your hands with an alcohol-based hand sanitizer that contains at least 60% alcohol. 8. As much as possible, stay in a specific room and away from other people in your home. Also, you should use a separate bathroom, if available. If you need to be around other people in or outside of the home, wear a mask. 9. Avoid sharing personal items with other people in your household, like dishes, towels, and bedding. 10. Clean all surfaces that are touched often, like counters, tabletops, and doorknobs. Use household cleaning sprays or wipes according to the label instructions. cdc.gov/coronavirus 12/12/2019 This information is not intended to replace advice given to you by your health care provider. Make sure you discuss any questions you have with your health care provider. Document Revised: 03/29/2020 Document Reviewed: 03/29/2020 Elsevier Patient Education  2021 Elsevier Inc.  What types of side effects do monoclonal antibody drugs cause?  Common side effects  In general, the more common side effects caused by monoclonal antibody drugs include: . Allergic reactions, such as hives or itching . Flu-like signs and symptoms, including chills, fatigue, fever, and muscle aches and pains . Nausea, vomiting . Diarrhea . Skin  rashes . Low blood pressure   The CDC is recommending patients who receive monoclonal antibody treatments wait at least 90 days before being vaccinated.  Currently, there are no data on the safety and efficacy of mRNA COVID-19 vaccines in persons who received monoclonal antibodies or convalescent plasma as part of COVID-19 treatment. Based on the estimated half-life of such therapies as well as evidence suggesting that reinfection is uncommon in the 90 days after initial infection, vaccination should be deferred for at least 90 days, as a precautionary measure until additional information becomes available, to avoid interference of the antibody treatment with vaccine-induced immune responses.   If someone you know is interested in receiving treatment please have them contact their MD for a referral or visit [email or web address]    

## 2020-10-01 NOTE — Telephone Encounter (Signed)
Thanks

## 2020-10-01 NOTE — Progress Notes (Addendum)
Diagnosis: COVID  Provider:  Marshell Garfinkel, MD  Procedure: Infusion  IV Type: Peripheral, IV Location: R Antecubital  Bebtelovimab, Dose: 175mg   Infusion Start Time: 1539  Infusion Stop Time: 1540  Post Infusion IV Care: Observation period completed and Peripheral IV Discontinued  Discharge: Condition: Good, Destination: Home . AVS provided to patient.   Performed by:  Koren Shiver, RN

## 2020-10-01 NOTE — Telephone Encounter (Signed)
See other phone note---looks like the monoclonal antibody has been ordered for him

## 2020-10-21 ENCOUNTER — Other Ambulatory Visit: Payer: Self-pay

## 2020-10-21 MED ORDER — NUPLAZID 34 MG PO CAPS: 34.0000 mg | ORAL_CAPSULE | Freq: Every day | ORAL | 5 refills | Status: DC

## 2020-10-27 ENCOUNTER — Other Ambulatory Visit: Payer: Self-pay | Admitting: Neurology

## 2020-10-28 ENCOUNTER — Other Ambulatory Visit: Payer: Self-pay | Admitting: Internal Medicine

## 2020-10-29 ENCOUNTER — Telehealth: Payer: Self-pay | Admitting: Neurology

## 2020-10-29 ENCOUNTER — Other Ambulatory Visit: Payer: Self-pay

## 2020-10-29 MED ORDER — CARBIDOPA-LEVODOPA 25-100 MG PO TABS: 2.0000 | ORAL_TABLET | Freq: Four times a day (QID) | ORAL | 3 refills | Status: DC

## 2020-10-29 NOTE — Telephone Encounter (Signed)
Landon called from [facility], needs a new rx for carbidopa-levodopa for 4x day.

## 2020-10-29 NOTE — Telephone Encounter (Signed)
Updated script sent in

## 2020-11-09 ENCOUNTER — Ambulatory Visit: Payer: 59 | Admitting: Neurology

## 2020-11-19 NOTE — Progress Notes (Signed)
Assessment/Plan:   1.  Parkinsons Disease  -pt comes in today and still on pramipexole, 0.5 mg tid.  Told to decrease this again and titration schedule given.  Discussed importance and why I am titrating it down.             -continue carbidopa/levodopa 25/100, 2 tablets at 6 AM/9:30 AM/1 PM/4:30 PM.     -asks about dbs.  Discussed surgical implications.  Discussed that he would need neurocognitive testing, but I would not want to do that when he was having confusion and hallucinations and was on pramipexole.  Hopefully those things will resolve off of pramipexole.   2.  Hallucinations             -On Nuplazid.  I may have to change this out for quetiapine, but want to see how the hallucinations do after I get him off of the pramipexole.   3.  Alcohol overuse             -Long discussion again with the patient regarding the fact that he really needs to get rid of the alcohol.  We have discussed this for years, but with the increasing hallucinations at this very young age, he really needs to stop using alcohol.  It is difficult to get out of him how much he drinks per night, just states that he drinks much less than he used to.  He is drinking orange juice and vodka.   4.  B12 deficiency             -On oral supplementation but recent labs showed it was still low and I wonder if he is taking the supplement regularly.  He states today he thought that he was on it but wife told him he was not   5.  Cervical stenosis with syringomyelia             -Declines repeat MRI   6.  I will plan on seeing the patient back in 5 weeks to reassess  Subjective:   [name] [name] was seen today in follow up for Parkinsons disease.  My previous records were reviewed prior to todays visit as well as outside records available to me.  I saw the patient last month and they weaned him off of the pramipexole and increased his levodopa.  Pt states that he doesn't know if he is still on pramipexole but he feels  really confident that he is.  Wife gives him his med.  In regards to hallucinations, the patient states that he is still having hallucinations.  Of note was that he did test positive for COVID the day after I saw him.    Current prescribed movement disorder medications: Carbidopa/levodopa 25/100, 2 tablets at 6 AM/9:30 AM/1 PM/4:30 PM Pramipexole STOPPED Nuplazid   PREVIOUS MEDICATIONS: pramipexole  ALLERGIES:   Allergies  Allergen Reactions   Hydrocodone Nausea Only    CURRENT MEDICATIONS:  Outpatient Encounter Medications as of 11/22/2020  Medication Sig   aspirin EC 81 MG tablet Take 81 mg by mouth daily.   benzonatate (TESSALON) 200 MG capsule Take 1 capsule (200 mg total) by mouth 2 (two) times daily as needed for cough.   carbidopa-levodopa (SINEMET IR) 25-100 MG tablet Take 2 tablets by mouth 4 (four) times daily. 6am/9:30am/1pm/4:30-5pm   guaiFENesin-codeine 100-10 MG/5ML syrup Take 5 mLs by mouth at bedtime as needed for cough.   losartan-hydrochlorothiazide (HYZAAR) 100-25 MG tablet TAKE 1 TABLET BY MOUTH ONCE A DAY  NUPLAZID 34 MG CAPS Take 1 capsule (34 mg total) by mouth daily.   vitamin B-12 (CYANOCOBALAMIN) 1000 MCG tablet Take 1,000 mcg by mouth daily.   pramipexole (MIRAPEX) 0.5 MG tablet Take 1 tablet (0.5 mg total) by mouth in the morning and at bedtime for 7 days, THEN 1 tablet (0.5 mg total) daily for 7 days.   [DISCONTINUED] potassium chloride (K-DUR) 10 MEQ tablet Take 1 tablet (10 mEq total) by mouth 2 (two) times daily.   Facility-Administered Encounter Medications as of 11/22/2020  Medication   0.9 %  sodium chloride infusion    Objective:   PHYSICAL EXAMINATION:    VITALS:   Vitals:   11/22/20 1404  BP: 128/84  Pulse: 69  SpO2: 97%  Weight: 259 lb (117.5 kg)  Height: 5\' 11"  (1.803 m)     GEN:  The patient appears stated age and is in NAD. HEENT:  Normocephalic, atraumatic.  The mucous membranes are moist. The superficial temporal arteries  are without ropiness or tenderness. CV:  RRR Lungs:  CTAB Neck/HEME:  There are no carotid bruits bilaterally.   Neurological examination:  Orientation: The patient is alert and oriented x3. Cranial nerves: There is good facial symmetry with facial hypomimia. The speech is fluent and clear. Soft palate rises symmetrically and there is no tongue deviation. Hearing is intact to conversational tone. Sensation: Sensation is intact to light touch throughout Motor: Strength is at least antigravity x4.  Movement examination: Tone: There is just mild increased tone today in the right upper extremity. Abnormal movements: there is occ/rare RUE tremor Coordination:  There is  decremation with RAM's, with any form of RAMS, including alternating supination and pronation of the forearm, hand opening and closing, finger taps, heel taps and toe taps on the right Gait and Station: The patient has no difficulty arising out of a deep-seated chair without the use of the hands. The patient's stride length is good, but he does have some decreased stride length and is forward flexed.    I have reviewed and interpreted the following labs independently    Chemistry      Component Value Date/Time   NA 138 09/20/2020 1557   K 3.4 (L) 09/20/2020 1557   CL 102 09/20/2020 1557   CO2 29 09/20/2020 1557   BUN 24 (H) 09/20/2020 1557   CREATININE 1.08 09/20/2020 1557   CREATININE 0.91 08/14/2019 1534      Component Value Date/Time   CALCIUM 9.2 09/20/2020 1557   ALKPHOS 32 (L) 09/20/2020 1557   AST 26 09/20/2020 1557   ALT 19 09/20/2020 1557   BILITOT 0.7 09/20/2020 1557       Lab Results  Component Value Date   WBC 7.4 09/20/2020   HGB 15.2 09/20/2020   HCT 44.4 09/20/2020   MCV 95.4 09/20/2020   PLT 188.0 09/20/2020    Lab Results  Component Value Date   TSH 1.29 08/14/2019     Total time spent on today's visit was 31 minutes, including both face-to-face time and nonface-to-face time.  Time  included that spent on review of records (prior notes available to me/labs/imaging if pertinent), discussing treatment and goals, answering patient's questions and coordinating care.  Cc:  [name], MD

## 2020-11-22 ENCOUNTER — Encounter: Payer: Self-pay | Admitting: Neurology

## 2020-11-22 ENCOUNTER — Ambulatory Visit: Payer: 59 | Admitting: Neurology

## 2020-11-22 ENCOUNTER — Other Ambulatory Visit: Payer: Self-pay

## 2020-11-22 VITALS — BP 128/84 | HR 69 | Ht 71.0 in | Wt 259.0 lb

## 2020-11-22 DIAGNOSIS — G20 Parkinson's disease: Secondary | ICD-10-CM | POA: Diagnosis not present

## 2020-11-22 DIAGNOSIS — R441 Visual hallucinations: Secondary | ICD-10-CM

## 2020-11-22 NOTE — Patient Instructions (Addendum)
Call me and let me confirm your dosage of meds if different from what pharmacy said.   You should be OFF of pramipexole.  You should be taking carbidopa/levodopa 25/100, 2 tablets at 6 AM/9:30 AM/1 PM/4:30 PM and Nuplazid ONLY from me.   This should be your schedule for weaning the medication  Week 1: Decrease pramipexole 0.5 mg, 1 in the AM and 1 in the PM Take carbidopa/levodopa 25/100, 2 at 6am/9:30am/1pm/4:30-5pm   Week 2: Decrease pramipexole 0.5 mg, 1 in the AM only Take carbidopa/levodopa 25/100, 2 at 6am/9:30am/1pm/4:30-5pm   Week 3 and beyond: STOP pramipexole Take carbidopa/levodopa 25/100, 2 at 6am/9:30am/1pm/4:30-5pm

## 2020-11-25 ENCOUNTER — Other Ambulatory Visit: Payer: Self-pay | Admitting: Registered Nurse

## 2020-11-25 DIAGNOSIS — U071 COVID-19: Secondary | ICD-10-CM

## 2020-12-14 ENCOUNTER — Other Ambulatory Visit: Payer: Self-pay | Admitting: Neurology

## 2020-12-14 MED ORDER — NUPLAZID 34 MG PO CAPS: 34.0000 mg | ORAL_CAPSULE | Freq: Every day | ORAL | 5 refills | Status: DC

## 2020-12-14 NOTE — Telephone Encounter (Signed)
Patient needs a refill on the Nuplazid medication, he uses the [facility]

## 2020-12-14 NOTE — Telephone Encounter (Signed)
Patient is requesting a refill on Nuplazid. Per your last office visit note:   On Nuplazid.  I may have to change this out for quetiapine, but want to see how the hallucinations do after I get him off of the pramipexole.

## 2020-12-17 ENCOUNTER — Other Ambulatory Visit: Payer: Self-pay

## 2020-12-17 ENCOUNTER — Telehealth: Payer: Self-pay | Admitting: Neurology

## 2020-12-17 MED ORDER — NUPLAZID 34 MG PO CAPS: ORAL_CAPSULE | ORAL | 0 refills | Status: DC

## 2020-12-17 NOTE — Telephone Encounter (Signed)
I don't think she meant plavix.  Think she meant nuplazid and pt came and picked up samples this afternoon

## 2020-12-17 NOTE — Telephone Encounter (Signed)
Pt called in and left a message with Access Nurse. Stated he is trying to refill his Plavix. [facility] told him it will be 3 days and is wanting to see if he can get a sample from the office that will get him through that 3 days?

## 2020-12-23 ENCOUNTER — Telehealth: Payer: Self-pay | Admitting: Neurology

## 2020-12-23 NOTE — Progress Notes (Signed)
Assessment/Plan:   1.  Parkinsons Disease             -continue carbidopa/levodopa 25/100, 2 tablets at 6 AM/9:30 AM/1 PM/4:30 PM.   need him to stay on this schedule and we will see how he looks once given on schedule.    -discussed that if he takes 4 po bid instead of 2 qid it will generate more hallucinations  -asks about dbs.  Discussed surgical implications.  Discussed that he would need neurocognitive testing.  Not sure how this will look given continued hallucinations off of pramipexole (even though better).  He would like to proceed  -sent message to [facility].  Wife interested in support grp   2.  Hallucinations             -On Nuplazid.     3.  Alcohol overuse             -Long discussion again with the patient regarding the fact that he really needs to get rid of the alcohol.  We have discussed this for years, but with the increasing hallucinations at this very young age, he really needs to stop using alcohol.  Discussed in detail with patient and wife today   4.  B12 deficiency             -On oral supplementation but recent labs showed it was still low and I wonder if he is taking the supplement regularly.  He states today he thought that he was on it but wife told him he was not   5.  Cervical stenosis with syringomyelia             -Declines repeat MRI     Subjective:   [name] [name] was seen today in follow up for Parkinsons disease.  My previous records were reviewed prior to todays visit as well as outside records available to me.  Wife present today (not been here in years).  She supplements hx.  I saw the patient last month and weaned him off of the pramipexole again (was previously supposed to do this but never did).  Reports that he is off of it now.  He still has some hallucinations (mostly illusions) but many are gone.  Wife notes getting slower (even before getting off of the pramipexole).  However, wife states that instead of qid he takes it bid (but takes 4 po  bid).  No feet/legs cramping at bed.  No falls since last visit (one fall before that but it was out of sleep - fell from the rocking chair).    Current prescribed movement disorder medications: Carbidopa/levodopa 25/100, 2 tablets at 6 AM/9:30 AM/1 PM/4:30 PM Pramipexole STOPPED Nuplazid   PREVIOUS MEDICATIONS: pramipexole  ALLERGIES:   Allergies  Allergen Reactions   Hydrocodone Nausea Only    CURRENT MEDICATIONS:  Outpatient Encounter Medications as of 12/27/2020  Medication Sig   aspirin EC 81 MG tablet Take 81 mg by mouth daily.   benzonatate (TESSALON) 200 MG capsule Take 1 capsule (200 mg total) by mouth 2 (two) times daily as needed for cough.   carbidopa-levodopa (SINEMET IR) 25-100 MG tablet Take 2 tablets by mouth 4 (four) times daily. 6am/9:30am/1pm/4:30-5pm   guaiFENesin-codeine 100-10 MG/5ML syrup Take 5 mLs by mouth at bedtime as needed for cough.   losartan-hydrochlorothiazide (HYZAAR) 100-25 MG tablet TAKE 1 TABLET BY MOUTH ONCE A DAY   NUPLAZID 34 MG CAPS Take 1 capsule (34 mg total) by mouth daily.  Pimavanserin Tartrate (NUPLAZID) 34 MG CAPS Samples of this drug were given to the patient, quantity 1 box, Lot Number [id number] exp:09/2022   pramipexole (MIRAPEX) 0.5 MG tablet Take 1 tablet (0.5 mg total) by mouth in the morning and at bedtime for 7 days, THEN 1 tablet (0.5 mg total) daily for 7 days.   vitamin B-12 (CYANOCOBALAMIN) 1000 MCG tablet Take 1,000 mcg by mouth daily.   [DISCONTINUED] potassium chloride (K-DUR) 10 MEQ tablet Take 1 tablet (10 mEq total) by mouth 2 (two) times daily.   Facility-Administered Encounter Medications as of 12/27/2020  Medication   0.9 %  sodium chloride infusion    Objective:   PHYSICAL EXAMINATION:    VITALS:   There were no vitals filed for this visit.    GEN:  The patient appears stated age and is in NAD. HEENT:  Normocephalic, atraumatic.  The mucous membranes are moist. The superficial temporal arteries are without  ropiness or tenderness. CV:  RRR Lungs:  CTAB Neck/HEME:  There are no carotid bruits bilaterally.   Neurological examination:  Orientation: The patient is alert and oriented x3. Cranial nerves: There is good facial symmetry with facial hypomimia. The speech is fluent and clear. Soft palate rises symmetrically and there is no tongue deviation. Hearing is intact to conversational tone. Sensation: Sensation is intact to light touch throughout Motor: Strength is at least antigravity x4.  Movement examination: Tone: There is just mild increased tone today in the right upper extremity. Abnormal movements: there is occ/rare RUE tremor Coordination:  There is  decremation with RAM's, with any form of RAMS, including alternating supination and pronation of the forearm, hand opening and closing, finger taps, heel taps and toe taps on the right Gait and Station: The patient has no difficulty arising out of a deep-seated chair without the use of the hands. The patient's stride length is good, but he does have some decreased stride length and is forward flexed.    I have reviewed and interpreted the following labs independently    Chemistry      Component Value Date/Time   NA 138 09/20/2020 1557   K 3.4 (L) 09/20/2020 1557   CL 102 09/20/2020 1557   CO2 29 09/20/2020 1557   BUN 24 (H) 09/20/2020 1557   CREATININE 1.08 09/20/2020 1557   CREATININE 0.91 08/14/2019 1534      Component Value Date/Time   CALCIUM 9.2 09/20/2020 1557   ALKPHOS 32 (L) 09/20/2020 1557   AST 26 09/20/2020 1557   ALT 19 09/20/2020 1557   BILITOT 0.7 09/20/2020 1557       Lab Results  Component Value Date   WBC 7.4 09/20/2020   HGB 15.2 09/20/2020   HCT 44.4 09/20/2020   MCV 95.4 09/20/2020   PLT 188.0 09/20/2020    Lab Results  Component Value Date   TSH 1.29 08/14/2019     Total time spent on today's visit was 40 minutes, including both face-to-face time and nonface-to-face time.  Time included  that spent on review of records (prior notes available to me/labs/imaging if pertinent), discussing treatment and goals, answering patient's questions and coordinating care.  Cc:  [name], MD

## 2020-12-23 NOTE — Telephone Encounter (Signed)
telephone CALL TO [facility] TO SEE WHAT THE NEX STEPS ARE AND WHY THE PATIENT HASN'T RECEIVED HIS MED'S.   PER THE REP THE NOTE FROM THE PHARMACIST STATES THEY NEED A NEW SCRIPT SENT IN.   New script called in.  Nuplizaid 34 mg qt:30 5 refills  Advise pt samples a the front desk and script called into [facility]

## 2020-12-23 NOTE — Telephone Encounter (Signed)
Patient called and said he is having problems getting his Nuplazid 34 MG refills. He is not sure what the holdup is, could be a PA issue.  Pharmacy told him this morning it can be resolved by calling the number below:  [facility]  Patient requests samples, if available. He only has two days of the medicine left.

## 2020-12-24 ENCOUNTER — Other Ambulatory Visit: Payer: Self-pay | Admitting: Registered Nurse

## 2020-12-24 DIAGNOSIS — U071 COVID-19: Secondary | ICD-10-CM

## 2020-12-27 ENCOUNTER — Ambulatory Visit: Payer: 59 | Admitting: Neurology

## 2020-12-27 ENCOUNTER — Encounter: Payer: Self-pay | Admitting: Neurology

## 2020-12-27 ENCOUNTER — Other Ambulatory Visit: Payer: Self-pay

## 2020-12-27 VITALS — BP 128/74 | HR 56 | Ht 71.0 in | Wt 251.4 lb

## 2020-12-27 DIAGNOSIS — R441 Visual hallucinations: Secondary | ICD-10-CM

## 2020-12-27 DIAGNOSIS — R413 Other amnesia: Secondary | ICD-10-CM | POA: Diagnosis not present

## 2020-12-27 DIAGNOSIS — G20 Parkinson's disease: Secondary | ICD-10-CM

## 2020-12-27 NOTE — Patient Instructions (Signed)
You have been referred for a neurocognitive evaluation (i.e., evaluation of memory and thinking abilities). Please bring someone with you to this appointment if possible, as it is helpful for the neuropsychologist to hear from both you and another adult who knows you well. Please bring eyeglasses and hearing aids if you wear them.    The evaluation will take approximately 3 hours and has two parts:   The first part is a clinical interview with the neuropsychologist, Dr. Melvyn Novas or Dr. Nicole Kindred. During the interview, the neuropsychologist will speak with you and the individual you brought to the appointment.    The second part of the evaluation is testing with the doctor's technician, aka psychometrician, Hinton Dyer or [employer]. During the testing, the technician will ask you to remember different types of material, solve problems, and answer some questionnaires. Your family member will not be present for this portion of the evaluation.   Please note: We have to reserve several hours of the neuropsychologist's time and the psychometrician's time for your evaluation appointment. As such, there is a No-Show fee of $100. If you are unable to attend any of your appointments, please contact our office as soon as possible to reschedule.

## 2021-02-16 ENCOUNTER — Other Ambulatory Visit: Payer: Self-pay | Admitting: Neurology

## 2021-03-17 ENCOUNTER — Telehealth: Payer: Self-pay | Admitting: Neurology

## 2021-03-17 NOTE — Telephone Encounter (Signed)
Pt called in stating his tremors are getting worse. Wants to see if something with the medicine can be changed?

## 2021-03-17 NOTE — Telephone Encounter (Signed)
Called patient and verified he is taking Nuplazid and Pramipexole and Carbidopa Levodopa he said his wife knows the dosage but he said what I read from the chart sounded correct. He said its =hard to grasp a spoon or fork to eat he is getting unable to grab anything out of his wallet he said simple tasks are becoming very difficult for him

## 2021-03-18 NOTE — Telephone Encounter (Signed)
Carbidopa Levodopa  2@ 6:30 2@9 :30 2@3 :30-4 2@6 :30 No hallucinations like he was  Alcohol 2-3 drinks per day

## 2021-03-21 NOTE — Telephone Encounter (Signed)
Call patient and have him do this schedule copied from Dr. Arturo Morton notes  -continue carbidopa/levodopa 25/100, 2 tablets at 6 AM/9:30 AM/1 PM/4:30 PM

## 2021-03-21 NOTE — Telephone Encounter (Signed)
Called patient and gave him schedule he is going to start right away and let us know how it is going for him

## 2021-05-03 ENCOUNTER — Encounter: Payer: Self-pay | Admitting: Psychology

## 2021-05-03 ENCOUNTER — Ambulatory Visit: Payer: 59 | Admitting: Psychology

## 2021-05-03 ENCOUNTER — Ambulatory Visit (INDEPENDENT_AMBULATORY_CARE_PROVIDER_SITE_OTHER): Payer: 59 | Admitting: Psychology

## 2021-05-03 ENCOUNTER — Other Ambulatory Visit: Payer: Self-pay

## 2021-05-03 DIAGNOSIS — G20 Parkinson's disease: Secondary | ICD-10-CM

## 2021-05-03 DIAGNOSIS — R4189 Other symptoms and signs involving cognitive functions and awareness: Secondary | ICD-10-CM

## 2021-05-03 DIAGNOSIS — R441 Visual hallucinations: Secondary | ICD-10-CM | POA: Diagnosis not present

## 2021-05-03 DIAGNOSIS — G3183 Dementia with Lewy bodies: Secondary | ICD-10-CM

## 2021-05-03 DIAGNOSIS — F02A0 Dementia in other diseases classified elsewhere, mild, without behavioral disturbance, psychotic disturbance, mood disturbance, and anxiety: Secondary | ICD-10-CM

## 2021-05-03 DIAGNOSIS — G4752 REM sleep behavior disorder: Secondary | ICD-10-CM

## 2021-05-03 HISTORY — DX: Dementia in other diseases classified elsewhere, mild, without behavioral disturbance, psychotic disturbance, mood disturbance, and anxiety: F02.A0

## 2021-05-03 NOTE — Progress Notes (Signed)
NEUROPSYCHOLOGICAL EVALUATION [place] [place] [facility] Neurology  Date of Evaluation: May 03, 2021  Reason for Referral:   [name] [name] is a 61 y.o. right-handed Caucasian male referred by Alonza Bogus, D.O., to characterize his current cognitive functioning and assist with diagnostic clarity and treatment planning in the context of subjective cognitive decline and a history of Parkinson's disease.  Assessment and Plan:   Clinical Impression(s): Overall, [name] [name]'s pattern of performance is suggestive of notable impairment surrounding visuospatial abilities outside his ability to copy a complex figure and draw a clock. There was also further performance variability across processing speed, executive functioning, and all aspects of learning and memory. Visual memory was improved relative to verbal memory. Performance was largely appropriate across attention/concentration, receptive language, and expressive language. While [name] [name]'s wife has taken over managing medication and personal finances, he continues to drive and work full-time without significant difficulty. As such, given evidence for cognitive dysfunction described above, he meets criteria for a Mild Neurocognitive Disorder (formerly "mild cognitive impairment") at the present time.  Regarding etiology, it is possible that current deficits are due to his known history of Parkinson's disease. However, I do have concerns surrounding the presence of Lewy body disease which would ultimately lead to a Lewy body dementia presentation. In addition to parkinsonian features, [name] [name] has a history of fully formed visual hallucinations and REM sleep behaviors, all of which represent classic symptoms of this disease process. He and his wife's report of the onset of these symptoms coinciding with cognitive decline is also quite concerning. Finally, his tremor was minimally present during testing, which may  suggest that other parkinsonian symptoms are more prevalent. This, combined with his report of levodopa medications being minimally effective is further concerning for Lewy body disease. Cognitively, pronounced impairments surrounding visuospatial abilities can be a hallmark sign of Lewy body disease. Variability surrounding processing speed, executive functioning, and memory, would align well with both Parkinson's disease and Lewy body disease. Continued medical monitoring will be very important moving forward.  Important Considerations for DBS Candidacy:  1. Is the patient experiencing cognitive symptoms that far exceed what is expected for their situation? Somewhat. As stated above, performance variability across processing speed and executive functioning is quite common in both Parkinson's disease and Lewy body disease. However, the extent of his visuospatial impairment is advanced relative to what would be expected from typically presenting Parkinson's disease. Memory decline was also mildly advanced relative to what is typically seen. However, the latter is certainly not unreasonable.  2. Is there a separate neurological process at work? While I cannot confirm anything at this point in time, I do have concerns surrounding Lewy body disease which would ultimately lead to a Lewy body dementia presentation due to symptoms and patterns described above.  3. Were any psychological stressors identified within the individual and/or family beyond PD that may impact post-operative adjustment? No, nothing notable was identified. 4. Can this person cope with the stress of surgery and be compliant as an awake participant in surgery? Yes, I believe so.  5. Can this person participate in the multiple post-operative programming sessions and medication adjustments? Yes, I believe so. 6. From a neuropsychological perspective, does this person appear to be a good candidate for DBS? I admittedly have concerns at the  present time. His tremor was minimally present during testing, even while completing motorized tasks. As DBS is less likely to improve other movement or gait abnormalities relative to  the benefit individuals can see in tremor alleviation, I am not sure how much of an improvement DBS will yield. It certainly is possible that tremors are more pronounced in other aspects of his life that I have not observed given this isolated meeting and evaluation. Concerns about Lewy body dementia also raise questions surrounding DBS candidacy, as does continued alcohol use which would be considered "heavy drinking" based upon current CDC guidelines.   Recommendations: For men, the CDC defines "heavy drinking" as consuming 15 or more alcoholic beverages per week. Based upon he and his wife's current estimation, he is likely exceeding this amount and has been for quite some time. I would strongly encourage him to diminish his use and ideally fully abstain from alcohol consumption. There remains the potential that this is not only worsening tremors, but also influencing visual hallucinations and clouding his diagnostic picture.   A repeat neuropsychological evaluation in 12-18 months (or sooner if functional decline is noted) is recommended to assess the trajectory of future cognitive decline should it occur. This will also aid in future efforts towards improved diagnostic clarity.  Admittedly, performance across neurocognitive testing is not a strong predictor of an individual's safety operating a motor vehicle. However, given visuospatial impairment, as well as his acknowledgement that he has trouble parking his truck and his wife's report that others often honk their horn at him while he is driving, I feel that the completion of a formal driving evaluation would be prudent. Should his family wish to pursue a formalized driving evaluation, they could reach out to the following agencies: [employer] in  [place]: [phone] Driver Rehabilitative Services: [facility]: [place]: [phone] or [phone]  Should there be a progression of current deficits over time, [name] [name] is unlikely to regain any independent living skills lost. Therefore, it is recommended that he remain as involved as possible in all aspects of household chores, finances, and medication management, with supervision to ensure adequate performance.  It will be important for him to have another person with him when in situations where he may need to process information, weigh the pros and cons of different options, and make decisions, in order to ensure that he fully understands and recalls all information to be considered.  [name] [name] is encouraged to attend to lifestyle factors for brain health (e.g., regular physical exercise, good nutrition habits, regular participation in cognitively-stimulating activities, and general stress management techniques), which are likely to have benefits for both emotional adjustment and cognition. Optimal control of vascular risk factors (including safe cardiovascular exercise and adherence to dietary recommendations) is encouraged. Continued participation in activities which provide mental stimulation and social interaction is also recommended.   When learning new information, he would benefit from information being broken up into small, manageable pieces. He may also find it helpful to articulate the material in his own words and in a context to promote encoding at the onset of a new task. This material may need to be repeated multiple times to promote encoding.  Memory can be improved using internal strategies such as rehearsal, repetition, chunking, mnemonics, association, and imagery. External strategies such as written notes in a consistently used memory journal, visual and nonverbal auditory cues such as a calendar on the refrigerator or  appointments with alarm, such as on a cell phone, can also help maximize recall.    To address problems with processing speed, he may wish to consider:   -Ensuring that he is alerted  when essential material or instructions are being presented   -Adjusting the speed at which new information is presented   -Allowing for more time in comprehending, processing, and responding in conversation  To address problems with executive dysfunction, he may wish to consider:   -Avoiding external distractions when needing to concentrate   -Limiting exposure to fast paced environments with multiple sensory demands   -Writing down complicated information and using checklists   -Attempting and completing one task at a time (i.e., no multi-tasking)   -Verbalizing aloud each step of a task to maintain focus   -Reducing the amount of information considered at one time  Review of Records:   [name] [name] was seen by [facility] Wells Guiles Tat, D.O.) on 06/24/2015 for an evaluation of a right-handed tremor which had been present for the past year. [name] [name] reported initially thinking that his tremor was coming from a problem in his cervical spine. Cervical spine MRI from 02/13/2013 revealed a moderate to large disc protrusion at the C6-C7 level which compressed the right C7 nerve root. There was no associated spinal cord signal change at that level. There was evidence of a syringomyelia but no cord expansion. Specific movement disorder symptoms included a progressive right-handed tremor, vivid dreams (present his "whole life"), instances where he acts out his dreams (occasionally hits pillow but not often), bradykinesia, micrographia, visual distortions, and short-term memory concerns. He was also noted to be consuming heavy amounts of alcohol (five vodka beverages per night). Based upon her examination, Dr. Carles Collet felt that [name] [name] had idiopathic Parkinson's disease.   He followed up with Dr. Carles Collet on 03/04/2020. During  this visit, it was noted that visual hallucinations were worsening. He was started on Nuplazid during a prior appointment in March 2021. He was seen on 06/07/2020 where it was mentioned that hallucinations had improved somewhat since weaning pramipexole. When seen on 09/27/2020, hallucinations were said to have continued. An example included him seeing a faceless little person wearing a red shirt and black pants who does not speak. Sometimes, there are more than one image. He was most recently seen on 12/27/2020 for follow-up. At that time, there were discussions surrounding DBS treatment as a means of addressing ongoing tremors. Dr. Carles Collet had repeatedly discussed the importance of greatly reducing and ideally abstaining from alcohol use. Ultimately, [name] [name] was referred for a comprehensive neuropsychological evaluation to characterize his cognitive abilities and to assist with diagnostic clarity and future treatment planning.   Brain MRI on 06/30/2015 revealed mild diffuse parenchymal volume loss. I was unable to locate any more recent neuroimaging.   Past Medical History:  Diagnosis Date   Alcohol abuse 09/08/2019   Episodes of formed visual hallucinations    Essential hypertension, benign 01/31/2007   Herniated disc, cervical    History of skin cancer    basal cell; face and abdomen   Hyperlipidemia    Parkinson's disease 04/27/2016   REM sleep behaviors    Syringomyelia 09/08/2019   Tremor of right hand 06/21/2015    Past Surgical History:  Procedure Laterality Date   BASAL CELL CARCINOMA EXCISION     EYE SURGERY     as a child   FINGER FRACTURE SURGERY     5th digit, left hand, x2   HERNIA REPAIR     age 64    Current Outpatient Medications:    aspirin EC 81 MG tablet, Take 81 mg by mouth daily., Disp: , Rfl:    benzonatate (TESSALON) 200  MG capsule, Take 1 capsule (200 mg total) by mouth 2 (two) times daily as needed for cough., Disp: 20 capsule, Rfl: 0   carbidopa-levodopa (SINEMET  IR) 25-100 MG tablet, Take 2 tablets by mouth 4 (four) times daily. 6am/9:30am/1pm/4:30-5pm, Disp: 720 tablet, Rfl: 3   guaiFENesin-codeine 100-10 MG/5ML syrup, Take 5 mLs by mouth at bedtime as needed for cough., Disp: 120 mL, Rfl: 0   losartan-hydrochlorothiazide (HYZAAR) 100-25 MG tablet, TAKE 1 TABLET BY MOUTH ONCE A DAY, Disp: 90 tablet, Rfl: 3   NUPLAZID 34 MG CAPS, Take 1 capsule (34 mg total) by mouth daily., Disp: 30 capsule, Rfl: 5   Pimavanserin Tartrate (NUPLAZID) 34 MG CAPS, Samples of this drug were given to the patient, quantity 1 box, Lot Number [id number] exp:09/2022, Disp: 7 capsule, Rfl: 0   pramipexole (MIRAPEX) 0.5 MG tablet, TAKE 1 TABLET BY MOUTH 3 TIMES DAILY, Disp: 270 tablet, Rfl: 3   vitamin B-12 (CYANOCOBALAMIN) 1000 MCG tablet, Take 1,000 mcg by mouth daily., Disp: , Rfl:   Current Facility-Administered Medications:    0.9 %  sodium chloride infusion, , Intravenous, PRN, Eileen Stanford, PA-C  Clinical Interview:   The following information was obtained during a clinical interview with [name] [name] and his wife prior to cognitive testing.  Cognitive Symptoms: Decreased short-term memory: Endorsed. 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 He further reported trouble recalling names of familiar individuals. Difficulties were said to have been present for the past couple of years and have gradually worsened over time.  Decreased long-term memory: Denied. Decreased attention/concentration: Endorsed. Reduced processing speed: Endorsed. Difficulties with executive functions: Denied. He also denied trouble with impulsivity or any instances of behavioral disinhibition or inappropriate judgment. His wife noted milder personality changes in that he seems "not as happy as he used to be" and seems more withdrawn while around others.  Difficulties with emotion regulation:  Denied. Difficulties with receptive language: Denied. Difficulties with word finding: Endorsed. Decreased visuoperceptual ability: Endorsed. He noted trouble with depth perception where he will commonly bump into tables or other things in his environment. He also noted trouble parking his truck. Deficits were said to follow a similar timeframe as reported memory dysfunction.   Difficulties completing ADLs: Somewhat. His wife manages his medications, as well as financial and bill paying responsibilities. He stated that he might be able to perform these actions independently, but that he would likely struggle and acknowledged that it would be challenging. He continues to drive. As described above, he reported some trouble parking due to depth perception issues. His wife noted that other drivers often "blow their horn" at [name] [name] due to him driving at a slow pace while on the road. He continues to work full-time. However, he did acknowledge the likelihood that his co-workers have observed a change in his job performance.   Additional Medical History: History of traumatic brain injury/concussion: Denied. History of stroke: Denied. History of seizure activity: Denied. History of known exposure to toxins: Denied. Symptoms of chronic pain: Denied. He did report some stiffness in his extremities, attributed to his history of Parkinson's disease.  Experience of frequent headaches/migraines: Denied. Frequent instances of dizziness/vertigo: Denied.  Sensory changes: He wears reading glasses with benefit when necessary. Other sensory changes/difficulties (i.e., hearing, taste, or smell) were denied.  Balance/coordination difficulties: Endorsed. He described his balance as "not as good as it used to be." He noted commonly  catching himself leaning to the left. While he denied any recent falls, his wife did report several near-falls where he has braced himself on furniture or other objects.  Other motor  difficulties: Endorsed. He reported a significant tremor, particularly affecting his right hand. Symptoms were said to have persisted for several years. Medications were not said to have been particularly effective at managing these symptoms over the years.   Sleep History: Estimated hours obtained each night: 6-8 hours.  Difficulties falling asleep: Denied. Difficulties staying asleep: Denied. Feels rested and refreshed upon awakening: Endorsed.  History of snoring: Endorsed. History of waking up gasping for air: Denied. Witnessed breath cessation while asleep: Denied.  History of vivid dreaming: Endorsed. Vivid dreaming was said to be present throughout a majority of his life.  Excessive movement while asleep: Endorsed. Instances of acting out his dreams: Endorsed. His wife stated that he will commonly toss and turn, as well as flip his arms around, while asleep. These behaviors were said to have emerged and remained persistent for the past several years.   Psychiatric/Behavioral Health History: Depression: [name] [name] denied ever being formally diagnosed with a mental health condition and did not report currently feeling depressed. As mentioned above, his wife described him as being more withdrawn and seeming sad over the past few years given physical and cognitive changes. She noted that, typically, he is the "most positive person I've ever met." Current or remote suicidal ideation, intent, or plan was denied.  Anxiety: Denied. Mania: Denied. Trauma History: Denied. Visual/auditory hallucinations: Endorsed. Visual hallucinations have been present for the past several years. These seem fully formed as records suggest him previously seeing a faceless little person wearing a red shirt and black pants who does not speak. Medication changes have improved the frequency and "seriousness" of these symptoms. However, these have persisted. Currently, he described seeing individuals at the edge of his  property near the trees. He expressed that these cause some distress in that he feels he must protect his home from potential threats.  Delusional thoughts: Denied.  Tobacco: Denied. Alcohol: He estimated consuming 3-4 alcoholic beverages on a nightly basis. His wife stated that he will consume 2-3 beverages per night. However, he may make a beverage, leave it, and make another one before finishing the original. As such, she was unsure how much alcohol he is actually consuming. Records suggest a history of heavy alcohol use and potential dependence. When asked, he denied alcohol intake ever creating functional difficulties throughout his life.  Recreational drugs: Denied.  Family History: Problem Relation Age of Onset   Diabetes Father    Hypertension Father    Heart attack Father    Hypertension Mother    Parkinson's disease Maternal Grandmother    This information was confirmed by [name] [name].  Academic/Vocational History: Highest level of educational attainment: 12 years. He graduated from high school and described himself as an average (mostly C, some B) student in academic settings. English/grammar was noted as a likely relative weakness.  History of developmental delay: Denied. History of grade repetition: Denied. Enrollment in special education courses: Denied. History of LD/ADHD: Denied.  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 While he stated that co-workers would not wish to embarrass him by pointing out cognitive short-comings, he did acknowledge that they are likely aware of what is going on to some extent.   Evaluation Results:   Behavioral Observations: [name] [name] was accompanied by his wife, arrived to  his appointment on time, and was appropriately dressed and groomed. He appeared alert and oriented. Observed gait and station were slowed but generally appropriate. Notable instability was not observed. Gross motor  functioning appeared intact upon informal observation and no abnormal movements (e.g., tremors) were noted. However, this may have been due to him keeping his hands folded in his lap throughout the entire interview. His affect was generally relaxed and positive. While there was a slight delay when responding to questions (assumedly due to slowed processing speed), spontaneous speech was fluent and word finding difficulties were not observed during interview. Thought processes were coherent, organized, and normal in content. Insight into his cognitive difficulties appeared adequate.   During testing, his tremor was quite minimal and did not appear to impact motorized tasks. It did increase with added stress; however, symptoms still appeared quite mild overall. Sustained attention was appropriate. Task engagement was adequate and he persisted when challenged. Overall, [name] [name] was cooperative with the clinical interview and subsequent testing procedures.   Adequacy of Effort: The validity of neuropsychological testing is limited by the extent to which the individual being tested may be assumed to have exerted adequate effort during testing. [name] [name] expressed his intention to perform to the best of his abilities and exhibited adequate task engagement and persistence. Scores across stand-alone and embedded performance validity measures were variable but largely within expectation. The sole task which scored below expectation was only one point below established cut-offs. As such, the results of the current evaluation are believed to be a valid representation of [name] [name]'s current cognitive functioning.  Test Results: Mr. Kumpf was fully oriented at the time of the current evaluation.  Intellectual abilities based upon educational and vocational attainment were estimated to be in the average range. Premorbid abilities were estimated to be within the average range based upon a single-word reading test.    Processing speed was variable, ranging from the exceptionally low to average normative ranges. Basic attention was well above average. More complex attention (e.g., working memory) was average. Executive functioning was variable, ranging from the exceptionally low to average normative ranges.  While not directly assessed, receptive language abilities were believed to be intact. Likewise, Mr. Gerhold did not exhibit any difficulties comprehending task instructions and answered all questions asked of him appropriately. Assessed expressive language (e.g., verbal fluency and confrontation naming) was mildly variable but overall appropriate, with scores ranging from the below average to above average normative ranges.     Assessed visuospatial/visuoconstructional abilities were largely exceptionally low to well below average. He did adequately draw a clock and copy a complex figure. However, all other tasks were notably below expectation.    Learning (i.e., encoding) of novel verbal and visual information was variable, ranging from the well below average to average normative ranges. Spontaneous delayed recall (i.e., retrieval) of previously learned information was well below average across verbal tasks and well above average across a visual task. Retention rates were 78% across a story learning task, 0% across a list learning task, and 150% across a shape learning task. Performance across recognition tasks was exceptionally low to below average, suggesting some evidence for information consolidation.   Results of emotional screening instruments suggested that recent symptoms of generalized anxiety were in the mild range, while symptoms of depression were within normal limits. A screening instrument assessing recent sleep quality suggested the presence of minimal sleep dysfunction.  Tables of Scores:   Note: This summary of test scores accompanies the interpretive report  and should not be considered in  isolation without reference to the appropriate sections in the text. Descriptors are based on appropriate normative data and may be adjusted based on clinical judgment. Terms such as "Within Normal Limits" and "Outside Normal Limits" are used when a more specific description of the test score cannot be determined.       Percentile - Normative Descriptor > 98 - Exceptionally High 91-97 - Well Above Average 75-90 - Above Average 25-74 - Average 9-24 - Below Average 2-8 - Well Below Average < 2 - Exceptionally Low       Validity:   DESCRIPTOR       ACS Word Choice: --- --- Outside Normal Limits  Dot Counting Test: --- --- Within Normal Limits  NAB EVI: --- --- Within Normal Limits  D-KEFS Color Word Effort Index: --- --- Within Normal Limits       Orientation:      Raw Score Percentile   NAB Orientation, Form 1 29/29 --- ---       Cognitive Screening:      Raw Score Percentile   SLUMS: 26/30 --- ---       Intellectual Functioning:      Standard Score Percentile   Test of Premorbid Functioning: 98 45 Average       Memory:     NAB Memory Module, Form 1: Standard Score/ T Score Percentile   Total Memory Index 77 6 Well Below Average  List Learning       Total Trials 1-3 16/36 (41) 18 Below Average    List B 2/12 (38) 12 Below Average    Short Delay Free Recall 3/12 (42) 7 Well Below Average    Long Delay Free Recall 0/12 (29) 2 Well Below Average    Retention Percentage 0 (11) <1 Exceptionally Low    Recognition Discriminability 3 (35) 7 Well Below Average  Shape Learning       Total Trials 1-3 8/27 (32) 4 Well Below Average    Delayed Recall 6/9 (54) 66 Average    Retention Percentage 150 (69) 97 Well Above Average    Recognition Discriminability 4 (37) 9 Below Average  Story Learning       Immediate Recall 37/80 (36) 8 Well Below Average    Delayed Recall 18/40 (36) 8 Well Below Average    Retention Percentage 78 (40) 16 Below Average  Daily Living Memory        Immediate Recall 40/51 (50) 50 Average    Delayed Recall 9/17 (37) 9 Below Average    Retention Percentage 64 (32) 4 Well Below Average    Recognition Hits 6/10 (24) <1 Exceptionally Low       Attention/Executive Function:     Trail Making Test (TMT): Raw Score (T Score) Percentile     Part A 36 secs.,  0 errors (47) 38 Average    Part B 201 secs.,  3 errors (33) 5 Well Below Average        Symbol Digit Modalities Test (SDMT): Raw Score (Z-Score) Percentile     Oral 20 (-3.21) <1 Exceptionally Low       NAB Attention Module, Form 1: T Score Percentile     Digits Forward 67 96 Well Above Average    Digits Backwards 45 31 Average        Scaled Score Percentile   WAIS-IV Similarities: 10 50 Average       D-KEFS Color-Word Interference Test: Raw Score (Scaled Score) Percentile  Color Naming 38 secs. (8) 25 Average    Word Reading 29 secs. (8) 25 Average    Inhibition 60 secs. (12) 75 Above Average      Total Errors 3 errors (9) 37 Average    Inhibition/Switching 87 secs. (9) 37 Average      Total Errors 4 errors (9) 37 Average       D-KEFS Verbal Fluency Test: Raw Score (Scaled Score) Percentile     Letter Total Correct 27 (7) 16 Below Average    Category Total Correct 40 (12) 75 Above Average    Category Switching Total Correct 4 (1) <1 Exceptionally Low    Category Switching Accuracy 3 (2) <1 Exceptionally Low      Total Set Loss Errors 0 (13) 84 Above Average      Total Repetition Errors 2 (11) 63 Average       Language:     Verbal Fluency Test: Raw Score (T Score) Percentile     Phonemic Fluency (FAS) 27 (40) 16 Below Average    Animal Fluency 17 (48) 42 Average        NAB Language Module, Form 1: T Score Percentile     Naming 31/31 (56) 73 Average       Visuospatial/Visuoconstruction:      Raw Score Percentile   Clock Drawing: 8/10 --- Within Normal Limits       NAB Spatial Module, Form 1: Standard Score/ T Score Percentile   Total Score 67 1 Exceptionally  Low    Visual Discrimination 29 2 Well Below Average    Design Construction 29 2 Well Below Average    Figure Drawing Copy 47 38 Average    Figure Drawing Immediate Recall 45 31 Average    Map Reading 26 1 Exceptionally Low       Mood and Personality:      Raw Score Percentile   PROMIS Depression Questionnaire: 12 --- None to Slight  PROMIS Anxiety Questionnaire: 19 --- Mild       Additional Questionnaires:      Raw Score Percentile   PROMIS Sleep Disturbance Questionnaire: 23 --- None to Slight   Informed Consent and Coding/Compliance:   Mr. Earwood was provided with a verbal description of the nature and purpose of the present neuropsychological evaluation. Also reviewed were the foreseeable risks and/or discomforts and benefits of the procedure, limits of confidentiality, and mandatory reporting requirements of this provider. The patient was given the opportunity to ask questions and receive answers about the evaluation. Oral consent to participate was provided by the patient.   This evaluation was conducted by Christia Reading, Ph.D., ABPP-CN, board certified clinical neuropsychologist. Mr. Stueve completed a comprehensive clinical interview with Dr. Melvyn Novas, billed as one unit [phone], and 145 minutes of cognitive testing and scoring, billed as one unit [phone] and four additional units [id number]. Psychometrist Milana Kidney, B.S., assisted Dr. Melvyn Novas with test administration and scoring procedures. As a separate and discrete service, Dr. Melvyn Novas spent a total of 180 minutes in interpretation and report writing billed as one unit [phone] and two units [id number].

## 2021-05-03 NOTE — Progress Notes (Signed)
   Psychometrician Note   Cognitive testing was administered to [name] [name] by Milana Kidney, B.S. (psychometrist) under the supervision of Dr. Christia Reading, Ph.D., licensed psychologist on 05/03/21. Mr. Labrosse did not appear overtly distressed by the testing session per behavioral observation or responses across self-report questionnaires. Rest breaks were offered.    The battery of tests administered was selected by Dr. Christia Reading, Ph.D. with consideration to Mr. Manfredonia's current level of functioning, the nature of his symptoms, emotional and behavioral responses during interview, level of literacy, observed level of motivation/effort, and the nature of the referral question. This battery was communicated to the psychometrist. Communication between Dr. Christia Reading, Ph.D. and the psychometrist was ongoing throughout the evaluation and Dr. Christia Reading, Ph.D. was immediately accessible at all times. Dr. Christia Reading, Ph.D. provided supervision to the psychometrist on the date of this service to the extent necessary to assure the quality of all services provided.    [name] [name] will return within approximately 1-2 weeks for an interactive feedback session with Dr. Melvyn Novas at which time his test performances, clinical impressions, and treatment recommendations will be reviewed in detail. Mr. Pineo understands he can contact our office should he require our assistance before this time.  A total of 145 minutes of billable time were spent face-to-face with Mr. Tilson by the psychometrist. This includes both test administration and scoring time. Billing for these services is reflected in the clinical report generated by Dr. Christia Reading, Ph.D.  This note reflects time spent with the psychometrician and does not include test scores or any clinical interpretations made by Dr. Melvyn Novas. The full report will follow in a separate note.

## 2021-05-04 ENCOUNTER — Encounter: Payer: Self-pay | Admitting: Psychology

## 2021-05-12 ENCOUNTER — Other Ambulatory Visit: Payer: Self-pay

## 2021-05-12 ENCOUNTER — Ambulatory Visit (INDEPENDENT_AMBULATORY_CARE_PROVIDER_SITE_OTHER): Payer: 59 | Admitting: Psychology

## 2021-05-12 DIAGNOSIS — F1099 Alcohol use, unspecified with unspecified alcohol-induced disorder: Secondary | ICD-10-CM | POA: Diagnosis not present

## 2021-05-12 DIAGNOSIS — F02A0 Dementia in other diseases classified elsewhere, mild, without behavioral disturbance, psychotic disturbance, mood disturbance, and anxiety: Secondary | ICD-10-CM

## 2021-05-12 DIAGNOSIS — R441 Visual hallucinations: Secondary | ICD-10-CM

## 2021-05-12 DIAGNOSIS — G4752 REM sleep behavior disorder: Secondary | ICD-10-CM

## 2021-05-12 DIAGNOSIS — G3183 Dementia with Lewy bodies: Secondary | ICD-10-CM | POA: Diagnosis not present

## 2021-05-12 NOTE — Progress Notes (Signed)
° °  Neuropsychology Feedback Session [name] [name]. [facility]  Reason for Referral:   [name] [name] is a 60 y.o. right-handed Caucasian male referred by Alonza Bogus, D.O., to characterize his current cognitive functioning and assist with diagnostic clarity and treatment planning in the context of subjective cognitive decline and a history of Parkinson's disease.  Feedback:   [name] [name] completed a comprehensive neuropsychological evaluation on 05/03/2021. Please refer to that encounter for the full report and recommendations. Briefly, results suggested notable impairment surrounding visuospatial abilities outside his ability to copy a complex figure and draw a clock. There was also further performance variability across processing speed, executive functioning, and all aspects of learning and memory. Visual memory was improved relative to verbal memory. Regarding etiology, it is possible that current deficits are due to his known history of Parkinson's disease. However, I do have concerns surrounding the presence of Lewy body disease which would ultimately lead to a Lewy body dementia presentation. In addition to parkinsonian features, [name] [name] has a history of fully formed visual hallucinations and REM sleep behaviors, all of which represent classic symptoms of this disease process. He and his wife's report of the onset of these symptoms coinciding with cognitive decline is also quite concerning. Finally, his tremor was minimally present during testing, which may suggest that other parkinsonian symptoms are more prevalent. This, combined with his report of levodopa medications being minimally effective is further concerning for Lewy body disease. Cognitively, pronounced impairments surrounding visuospatial abilities can be a hallmark sign of Lewy body disease. Variability surrounding processing speed, executive functioning, and memory, would align well with both  Parkinson's disease and Lewy body disease. Continued medical monitoring will be very important moving forward.  [name] [name] was unaccompanied during the current feedback appointment. Content of the current session focused on the results of his neuropsychological evaluation. [name] [name] was given the opportunity to ask questions and his questions were answered. He was encouraged to reach out should additional questions arise. A copy of his report was provided at the conclusion of the visit.      20 minutes were spent conducting the current feedback session with [name] [name], billed as one unit [phone].

## 2021-06-03 ENCOUNTER — Other Ambulatory Visit: Payer: Self-pay | Admitting: Neurology

## 2021-06-06 ENCOUNTER — Telehealth: Payer: Self-pay | Admitting: Neurology

## 2021-06-06 ENCOUNTER — Other Ambulatory Visit: Payer: Self-pay

## 2021-06-06 DIAGNOSIS — R441 Visual hallucinations: Secondary | ICD-10-CM

## 2021-06-06 DIAGNOSIS — G3183 Dementia with Lewy bodies: Secondary | ICD-10-CM

## 2021-06-06 MED ORDER — NUPLAZID 34 MG PO CAPS: 34.0000 mg | ORAL_CAPSULE | Freq: Every day | ORAL | 5 refills | Status: DC

## 2021-06-06 MED ORDER — NUPLAZID 34 MG PO CAPS: ORAL_CAPSULE | ORAL | 0 refills | Status: DC

## 2021-06-06 NOTE — Telephone Encounter (Signed)
Called [facility] and provided new prescription for patient. Called patient and left voicemail that I had completed the call

## 2021-06-06 NOTE — Telephone Encounter (Deleted)
Called patient no answer called [facility] prescription has been filled will call patient back tomorrow morning

## 2021-06-06 NOTE — Telephone Encounter (Signed)
Pt called in wanting to see if he could get some samples of Nuplazid? He needs 5 pills.

## 2021-06-06 NOTE — Telephone Encounter (Signed)
Patient said [facility] needs Tat to call them regarding the refill for Nuplazid. [phone]

## 2021-06-06 NOTE — Telephone Encounter (Signed)
Called [facility] and put medication up front to hold him over until his mail order can come in

## 2021-06-13 NOTE — Progress Notes (Signed)
Assessment/Plan:   1.  Parkinsons Disease             -continue carbidopa/levodopa 25/100, 2 tablets at 6 AM/9:30 AM/1 PM/4:30 PM.   He can take half tablet extra if needed on days that tremor is more bothersome.  -Unfortunately, given status of memory and continued hallucinations (although they are much better), I do not think that the patient is a DBS candidate.  I worry that DBS would make cognitive symptoms worse.  We discussed that DBS generally does not do better than medication when medication is working its best.  He actually looks physically quite good today.  He is understandably a little bit disappointed, as was his wife.  I am happy to refer him for a second opinion if he would like.  They will think about that and let me know if they want a referral.    2.  Hallucinations             -On Nuplazid.  Hallucinations are actually much better now that he is off of pramipexole.   3.  Alcohol overuse             -Really proud of the patient that he has worked hard on weaning down the alcohol.  He reports that he is only drinking 1 daily.  I told him that I really would like to see him off of this altogether.  We discussed reasons for this (would like to spare the liver; discussed alcohol related dementia versus Parkinson's dementia; discussed alcohol producing cerebellar atrophy).   4.  B12 deficiency             -Recommend oral B12 supplementation.   5.  Cervical stenosis with syringomyelia             -Really would like to repeat this MRI of the cervical spine.  He and I talked about that for a long time.  He is seriously going to think about that.   6.  Cognitive impairment  -Dr. Melvyn Novas did memory testing in December, 2022.  While that only revealed MCI, he was concerned with dementia with Lewy bodies.  While I really do not think he meets criteria for that as he did not have profound memory impairment early in the disease, and really has not had interfere met with social and  occupational or ADLs (requirements for LBD), I do have concerns that memory impairment is profound enough to not be a candidate for DBS.  -I would like to do a more recent MRI of the brain.  He is very leery about that, primarily because of being claustrophobic.  He is going to think about that and let me know.   Subjective:   [name] [name] was seen today in follow up for Parkinsons disease.  My previous records were reviewed prior to todays visit as well as outside records available to me.  Wife present today.  Patient had neurocognitive testing done since last visit.  This was done in December, 2022.  I spoke to Dr. Melvyn Novas personally about this testing as well.  Dr. Melvyn Novas noted MCI, but had concerns about possible LBD.  He had concerns about surgical intervention.  Last visit, when I saw the patient I told him to stay on the prescribed levodopa schedule (2 tablets 4 times per day rather than the 4 tablets twice per day that he was taking).  I also told him he really needed to wean off of the alcohol  altogether.  He states that he is down to 1 drink per day (states that he is 95% per day).  Hallucinations are markedly better.  He has some illusions.  He has trouble with depth perception.  No falls.    Wife notes a lot of acting of the dreams.  No falling out of the bed from this.  Trouble getting in and out of the bed.  Some back pain but "no more than other 26 year olds."  Has some neck pain  Current prescribed movement disorder medications: Carbidopa/levodopa 25/100, 2 tablets at 6 AM/9:30 AM/1 PM/4:30 PM Pramipexole STOPPED Nuplazid   PREVIOUS MEDICATIONS: pramipexole  ALLERGIES:   Allergies  Allergen Reactions   Hydrocodone Nausea Only    CURRENT MEDICATIONS:  Outpatient Encounter Medications as of 06/14/2021  Medication Sig   aspirin EC 81 MG tablet Take 81 mg by mouth daily.   benzonatate (TESSALON) 200 MG capsule Take 1 capsule (200 mg total) by mouth 2 (two) times daily as  needed for cough.   carbidopa-levodopa (SINEMET IR) 25-100 MG tablet Take 2 tablets by mouth 4 (four) times daily. 6am/9:30am/1pm/4:30-5pm   guaiFENesin-codeine 100-10 MG/5ML syrup Take 5 mLs by mouth at bedtime as needed for cough.   losartan-hydrochlorothiazide (HYZAAR) 100-25 MG tablet TAKE 1 TABLET BY MOUTH ONCE A DAY   NUPLAZID 34 MG CAPS Take 1 capsule (34 mg total) by mouth daily.   Pimavanserin Tartrate (NUPLAZID) 34 MG CAPS Samples of this drug were given to the patient, quantity 1 box, Lot Number [id number] exp:09/2022   Pimavanserin Tartrate (NUPLAZID) 34 MG CAPS Samples of this drug were given to the patient, quantity 1, Lot Number [id number] Exp 09/2022   pramipexole (MIRAPEX) 0.5 MG tablet TAKE 1 TABLET BY MOUTH 3 TIMES DAILY   vitamin B-12 (CYANOCOBALAMIN) 1000 MCG tablet Take 1,000 mcg by mouth daily.   [DISCONTINUED] potassium chloride (K-DUR) 10 MEQ tablet Take 1 tablet (10 mEq total) by mouth 2 (two) times daily.   Facility-Administered Encounter Medications as of 06/14/2021  Medication   0.9 %  sodium chloride infusion    Objective:   PHYSICAL EXAMINATION:    VITALS:   Vitals:   06/14/21 1523  BP: 124/74  Pulse: 75  SpO2: 98%  Weight: 245 lb 6.4 oz (111.3 kg)  Height: '5\' 11"'  (1.803 m)      GEN:  The patient appears stated age and is in NAD. HEENT:  Normocephalic, atraumatic.  The mucous membranes are moist. The superficial temporal arteries are without ropiness or tenderness. CV:  RRR Lungs:  CTAB Neck/HEME:  There are no carotid bruits bilaterally.   Neurological examination:  Orientation: The patient is alert and oriented x3. Cranial nerves: There is good facial symmetry with facial hypomimia. The speech is fluent and clear. Soft palate rises symmetrically and there is no tongue deviation. Hearing is intact to conversational tone. Sensation: Sensation is intact to light touch throughout Motor: Strength is at least antigravity x4.  Movement  examination: Tone: no increased tone Abnormal movements: good/no tremor Coordination:  There is min decremation on the right Gait and Station: The patient has no difficulty arising out of a deep-seated chair without the use of the hands. he does have some mild decreased stride length and is forward flexed.    I have reviewed and interpreted the following labs independently    Chemistry      Component Value Date/Time   NA 138 09/20/2020 1557   K 3.4 (L) 09/20/2020 1557  CL 102 09/20/2020 1557   CO2 29 09/20/2020 1557   BUN 24 (H) 09/20/2020 1557   CREATININE 1.08 09/20/2020 1557   CREATININE 0.91 08/14/2019 1534      Component Value Date/Time   CALCIUM 9.2 09/20/2020 1557   ALKPHOS 32 (L) 09/20/2020 1557   AST 26 09/20/2020 1557   ALT 19 09/20/2020 1557   BILITOT 0.7 09/20/2020 1557       Lab Results  Component Value Date   WBC 7.4 09/20/2020   HGB 15.2 09/20/2020   HCT 44.4 09/20/2020   MCV 95.4 09/20/2020   PLT 188.0 09/20/2020    Lab Results  Component Value Date   TSH 1.29 08/14/2019     Total time spent on today's visit was 41 minutes, including both face-to-face time and nonface-to-face time.  Time included that spent on review of records (prior notes available to me/labs/imaging if pertinent), discussing treatment and goals, answering patient's questions and coordinating care.  Cc:  [name], MD

## 2021-06-14 ENCOUNTER — Other Ambulatory Visit: Payer: Self-pay

## 2021-06-14 ENCOUNTER — Encounter: Payer: Self-pay | Admitting: Neurology

## 2021-06-14 ENCOUNTER — Ambulatory Visit: Payer: 59 | Admitting: Neurology

## 2021-06-14 VITALS — BP 124/74 | HR 75 | Ht 71.0 in | Wt 245.4 lb

## 2021-06-14 DIAGNOSIS — R4189 Other symptoms and signs involving cognitive functions and awareness: Secondary | ICD-10-CM | POA: Diagnosis not present

## 2021-06-14 DIAGNOSIS — R441 Visual hallucinations: Secondary | ICD-10-CM

## 2021-06-14 DIAGNOSIS — G950 Syringomyelia and syringobulbia: Secondary | ICD-10-CM

## 2021-06-14 DIAGNOSIS — G20 Parkinson's disease: Secondary | ICD-10-CM | POA: Diagnosis not present

## 2021-06-14 NOTE — Patient Instructions (Signed)
You can take extra 1/2 tablet of levodopa if needed for a day in which tremor is bad I would like to do an MRI brain on you and also another MRI cervical spine to look at that syrinx.  Let me know :) Let me know if you need another opinion regarding the dbs at another center You need to exercise!!  Let me know if you want a referral for physical therapy or resources in our community for Parkinsons Disease exercise

## 2021-07-01 ENCOUNTER — Telehealth: Payer: Self-pay

## 2021-07-01 NOTE — Telephone Encounter (Signed)
New message   Your information has been sent to [facility].  Harrold Donath (Key: [id number]) Nuplazid 34MG  capsules   Form [facility] Electronic Prior Authorization Form (2017 NCPDP) Created 1 day ago Sent to Plan 7 minutes ago Plan Response 7 minutes ago Submit Clinical Questions less than a minute ago Determination Wait for Determination Please wait for [facility] 2017 NCPDP to return a determination.

## 2021-09-28 ENCOUNTER — Other Ambulatory Visit: Payer: Self-pay | Admitting: Internal Medicine

## 2021-10-06 ENCOUNTER — Telehealth: Payer: Self-pay | Admitting: Neurology

## 2021-10-06 NOTE — Telephone Encounter (Signed)
Called patient and gave dr. Delynn Flavin recommendations patient understood and agreed  ?

## 2021-10-06 NOTE — Telephone Encounter (Signed)
Patient stated the medicine Tat put him on for tremors is not as effective as before. He would like to know if it can be upped or can he take it  more often ?

## 2021-10-28 ENCOUNTER — Ambulatory Visit (INDEPENDENT_AMBULATORY_CARE_PROVIDER_SITE_OTHER): Payer: 59 | Admitting: Internal Medicine

## 2021-10-28 ENCOUNTER — Encounter: Payer: Self-pay | Admitting: Internal Medicine

## 2021-10-28 VITALS — BP 116/78 | HR 60 | Temp 97.4°F | Ht 69.5 in | Wt 242.0 lb

## 2021-10-28 DIAGNOSIS — I10 Essential (primary) hypertension: Secondary | ICD-10-CM

## 2021-10-28 DIAGNOSIS — G20 Parkinson's disease: Secondary | ICD-10-CM

## 2021-10-28 DIAGNOSIS — Z0000 Encounter for general adult medical examination without abnormal findings: Secondary | ICD-10-CM | POA: Diagnosis not present

## 2021-10-28 DIAGNOSIS — Z23 Encounter for immunization: Secondary | ICD-10-CM | POA: Diagnosis not present

## 2021-10-28 DIAGNOSIS — Z1211 Encounter for screening for malignant neoplasm of colon: Secondary | ICD-10-CM | POA: Diagnosis not present

## 2021-10-28 DIAGNOSIS — Z125 Encounter for screening for malignant neoplasm of prostate: Secondary | ICD-10-CM

## 2021-10-28 LAB — COMPREHENSIVE METABOLIC PANEL
ALT: 8 U/L (ref 0–53)
AST: 24 U/L (ref 0–37)
Albumin: 4.7 g/dL (ref 3.5–5.2)
Alkaline Phosphatase: 35 U/L — ABNORMAL LOW (ref 39–117)
BUN: 17 mg/dL (ref 6–23)
CO2: 30 mEq/L (ref 19–32)
Calcium: 9.8 mg/dL (ref 8.4–10.5)
Chloride: 100 mEq/L (ref 96–112)
Creatinine, Ser: 1.09 mg/dL (ref 0.40–1.50)
GFR: 72.64 mL/min (ref >60.00)
Glucose, Bld: 101 mg/dL — ABNORMAL HIGH (ref 70–99)
Potassium: 4.1 mEq/L (ref 3.5–5.1)
Sodium: 139 mEq/L (ref 135–145)
Total Bilirubin: 1 mg/dL (ref 0.2–1.2)
Total Protein: 7.6 g/dL (ref 6.0–8.3)

## 2021-10-28 LAB — LIPID PANEL
Cholesterol: 173 mg/dL (ref 0–200)
HDL: 40.9 mg/dL (ref >39.00)
LDL Cholesterol: 105 mg/dL — ABNORMAL HIGH (ref 0–99)
NonHDL: 132.24
Total CHOL/HDL Ratio: 4
Triglycerides: 135 mg/dL (ref 0.0–149.0)
VLDL: 27 mg/dL (ref 0.0–40.0)

## 2021-10-28 LAB — CBC
HCT: 46.4 % (ref 39.0–52.0)
Hemoglobin: 15.8 g/dL (ref 13.0–17.0)
MCHC: 34.2 g/dL (ref 30.0–36.0)
MCV: 96.5 fl (ref 78.0–100.0)
Platelets: 134 10*3/uL — ABNORMAL LOW (ref 150.0–400.0)
RBC: 4.8 Mil/uL (ref 4.22–5.81)
RDW: 12.9 % (ref 11.5–15.5)
WBC: 4.8 10*3/uL (ref 4.0–10.5)

## 2021-10-28 LAB — PSA: PSA: 0.56 ng/mL (ref 0.10–4.00)

## 2021-10-28 NOTE — Assessment & Plan Note (Signed)
Discussed fitness Will do FIT Discussed PSA--will check shingrix today Bivalent COVID and flu vaccines by fall

## 2021-10-28 NOTE — Assessment & Plan Note (Signed)
Rx per Dr Carles Collet

## 2021-10-28 NOTE — Progress Notes (Signed)
Subjective:    Patient ID: [name] [name], male    DOB: December 24, 1960, 61 y.o.   MRN: [id number]  HPI Here for physical  Parkinson's is under fair control  Not DBS candidate No hallucinations now on nuplazid Has trouble eating ---hand coordination. Doesn't like to eat out in pubic  No problems with BP med  Current Outpatient Medications on File Prior to Visit  Medication Sig Dispense Refill   aspirin EC 81 MG tablet Take 81 mg by mouth daily.     carbidopa-levodopa (SINEMET IR) 25-100 MG tablet Take 2 tablets by mouth 4 (four) times daily. 6am/9:30am/1pm/4:30-5pm 720 tablet 3   losartan-hydrochlorothiazide (HYZAAR) 100-25 MG tablet TAKE 1 TABLET BY MOUTH ONCE A DAY 90 tablet 3   NUPLAZID 34 MG CAPS Take 1 capsule (34 mg total) by mouth daily. 30 capsule 5   vitamin B-12 (CYANOCOBALAMIN) 1000 MCG tablet Take 1,000 mcg by mouth daily.     pramipexole (MIRAPEX) 0.5 MG tablet TAKE 1 TABLET BY MOUTH 3 TIMES DAILY 270 tablet 3   [DISCONTINUED] potassium chloride (K-DUR) 10 MEQ tablet Take 1 tablet (10 mEq total) by mouth 2 (two) times daily. 60 tablet 2   No current facility-administered medications on file prior to visit.    Allergies  Allergen Reactions   Hydrocodone Nausea Only    Past Medical History:  Diagnosis Date   Alcohol abuse 09/08/2019   Episodes of formed visual hallucinations    Essential hypertension, benign 01/31/2007   Herniated disc, cervical    History of skin cancer    basal cell; face and abdomen   Hyperlipidemia    Mild neurocognitive disorder with Lewy bodies, possible 05/03/2021   Parkinson's disease 04/27/2016   REM sleep behaviors    Syringomyelia 09/08/2019   Tremor of right hand 06/21/2015    Past Surgical History:  Procedure Laterality Date   BASAL CELL CARCINOMA EXCISION     EYE SURGERY     as a child   FINGER FRACTURE SURGERY     5th digit, left hand, x2   HERNIA REPAIR     age 26    Family History  Problem Relation Age of Onset    Hypertension Mother    Diabetes Father    Hypertension Father    Heart attack Father    Parkinson's disease Maternal Grandmother    Parkinsonism Maternal Grandmother     Social History   Socioeconomic History   Marital status: Married    Spouse name: Not on file   Number of children: 1   Years of education: 12   Highest education level: High school graduate  Occupational History   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        Overdue for dentist  Eyes:        Does have some diplopia at times---brief  Respiratory:  Negative for cough, chest tightness and shortness of breath.   Cardiovascular:  Positive for palpitations. Negative for chest pain and leg swelling.       Flutters at times--for 20 years  Gastrointestinal:  Negative for  blood in stool and constipation.       No heartburn  Endocrine: Negative for polydipsia and polyuria.  Genitourinary:  Positive for frequency. Negative for urgency.       Some slowing of flow No sexual problems  Musculoskeletal:        Knee and hip pain---soreness Some back stiffness after bending  Skin:  Negative for rash.  Allergic/Immunologic: Negative for environmental allergies and immunocompromised state.  Neurological:  Positive for tremors. Negative for dizziness, syncope, light-headedness and headaches.  Hematological:  Negative for adenopathy. Does not bruise/bleed easily.  Psychiatric/Behavioral:  Negative for dysphoric mood and sleep disturbance.        Mild situational anxiety      Objective:   Physical Exam Constitutional:      Appearance: Normal appearance.  HENT:     Mouth/Throat:     Comments: Slight right exotropia Eyes:     Conjunctiva/sclera: Conjunctivae normal.     Pupils: Pupils are equal, round, and reactive to light.  Cardiovascular:     Rate and Rhythm: Normal rate and regular rhythm.     Pulses: Normal pulses.     Heart sounds: No murmur heard.   No gallop.  Pulmonary:     Effort: Pulmonary effort is normal.     Breath sounds: Normal breath sounds. No wheezing or rales.  Abdominal:     Palpations: Abdomen is soft.     Tenderness: There is no abdominal tenderness.  Musculoskeletal:     Cervical back: Neck supple.     Right lower leg: No edema.     Left lower leg: No edema.  Lymphadenopathy:     Cervical: No cervical adenopathy.  Skin:    Findings: No lesion or rash.  Neurological:     Mental Status: He is alert and oriented to person, place, and time.     Comments: Mild bradykinesia and tremor  Psychiatric:        Mood and Affect: Mood normal.        Behavior: Behavior normal.           Assessment & Plan:

## 2021-10-28 NOTE — Assessment & Plan Note (Signed)
BP Readings from Last 3 Encounters:  10/28/21 116/78  06/14/21 124/74  12/27/20 128/74   Good control on the losartan/HCTZ 100/25 Will check labs

## 2021-10-28 NOTE — Addendum Note (Signed)
Addended by: Pilar Grammes on: 10/28/2021 09:51 AM   Modules accepted: Orders

## 2021-11-07 ENCOUNTER — Other Ambulatory Visit (INDEPENDENT_AMBULATORY_CARE_PROVIDER_SITE_OTHER): Payer: 59

## 2021-11-07 DIAGNOSIS — Z1211 Encounter for screening for malignant neoplasm of colon: Secondary | ICD-10-CM

## 2021-11-08 ENCOUNTER — Other Ambulatory Visit: Payer: Self-pay | Admitting: Neurology

## 2021-11-08 LAB — FECAL OCCULT BLOOD, IMMUNOCHEMICAL: Fecal Occult Bld: NEGATIVE

## 2021-11-24 ENCOUNTER — Other Ambulatory Visit: Payer: Self-pay | Admitting: Neurology

## 2021-11-24 DIAGNOSIS — R441 Visual hallucinations: Secondary | ICD-10-CM

## 2021-11-25 ENCOUNTER — Telehealth: Payer: Self-pay

## 2021-11-25 MED ORDER — NUPLAZID 34 MG PO CAPS: ORAL_CAPSULE | ORAL | 0 refills | Status: DC

## 2021-11-25 NOTE — Telephone Encounter (Signed)
Called patient and informed him  that we can provide him some samples to last him through the weekend. Patient stated that he will come by and pick samples up before we close.

## 2021-11-25 NOTE — Telephone Encounter (Signed)
1. Which medications need refilled? (List name and dosage, if known) Nuplazid  2. Which pharmacy/location is medication to be sent to? (include street and city if local pharmacy) [place] told the patient we were not responding to requests for refill.

## 2021-11-25 NOTE — Telephone Encounter (Signed)
Patient called and said his Nuplazid was sent to the wrong address per the pharmacy. He'd like to speak with clinical staff about this as he was expecting it today.  He is not sure he has enough to get through the weekend.

## 2021-11-25 NOTE — Telephone Encounter (Signed)
Nuplazid samples provided to patient.

## 2021-12-22 ENCOUNTER — Other Ambulatory Visit: Payer: Self-pay | Admitting: Neurology

## 2021-12-22 DIAGNOSIS — R441 Visual hallucinations: Secondary | ICD-10-CM

## 2021-12-27 ENCOUNTER — Encounter: Payer: Self-pay | Admitting: Neurology

## 2021-12-27 ENCOUNTER — Ambulatory Visit (INDEPENDENT_AMBULATORY_CARE_PROVIDER_SITE_OTHER): Payer: 59 | Admitting: Neurology

## 2021-12-27 VITALS — BP 146/78 | HR 60 | Ht 71.0 in | Wt 241.2 lb

## 2021-12-27 DIAGNOSIS — G20 Parkinson's disease: Secondary | ICD-10-CM | POA: Diagnosis not present

## 2021-12-27 MED ORDER — CARBIDOPA-LEVODOPA ER 50-200 MG PO TBCR: 1.0000 | EXTENDED_RELEASE_TABLET | Freq: Every day | ORAL | 2 refills | Status: DC

## 2021-12-27 NOTE — Patient Instructions (Addendum)
continue carbidopa/levodopa 25/100, 2 tablets at 6 AM/9:30 AM/1 PM/4:30 PM ADD carbidopa/levodopa 50/200 CR at bedtime Continue nuplazid 34 mg at bedtime

## 2021-12-27 NOTE — Progress Notes (Signed)
Assessment/Plan:   1.  Parkinsons Disease             -continue carbidopa/levodopa 25/100, 2 tablets at 6 AM/9:30 AM/1 PM/4:30 PM.   He can take half tablet extra if needed on days that tremor is more bothersome.  -add carbidopa/levodopa 50/200 CR at bedtime  -Unfortunately, given status of memory and continued hallucinations (although they are much better), I do not think that the patient is a DBS candidate.  I worry that DBS would make cognitive symptoms worse.  We discussed that DBS generally does not do better than medication when medication is working its best.  He has been offered a second opinion but does not want one.  -Patient really has declined with the course of time and does not think that he is going to be able to continue his job, and I agree with this.  His job is very physical.  It requires getting up on ladders, and I absolutely do not want him doing that.  His coworkers are protective of him, but he does not think he is going to be able to continue to do this.  I really think that he needs to look into disability and encouraged him to do this.    2.  Hallucinations             -On Nuplazid.  Hallucinations are actually much better now that he is off of pramipexole.   3.  Alcohol overuse             -Really proud of the patient that he has worked hard on weaning down the alcohol.  He reports that he is only drinking 1 daily.  I told him that I really would like to see him off of this altogether.  We discussed this again today.  We discussed reasons for this (would like to spare the liver; discussed alcohol related dementia versus Parkinson's dementia; discussed alcohol producing cerebellar atrophy).   4.  B12 deficiency             -Recommend oral B12 supplementation.   5.  Cervical stenosis with syringomyelia             -Really would like to repeat this MRI of the cervical spine.  He and I talked about that for a long time.  He is seriously going to think about that.    6.  Cognitive impairment  -Dr. Melvyn Novas did memory testing in December, 2022.  While that only revealed MCI, he was concerned with dementia with Lewy bodies.  While I really do not think he meets criteria for that as he did not have profound memory impairment early in the disease, and really has not had memory interfere with social and occupational or ADLs (requirements for LBD), I do have concerns that memory impairment is profound enough to not be a candidate for DBS.  7.  Sialorrhea  -Was fairly significant in the office today.  Botox offered.  Patient does not want that right now.  States that it is not that bothersome to him    Subjective:   [name] [name] was seen today in follow up for Parkinsons disease.  My previous records were reviewed prior to todays visit as well as outside records available to me.  He feels he has been about the same since our last visit.  He had 1 fall.  It was not at work.  He fell outside.  He does act out his dreams,  but no falling out of the bed.  Memory has not been good, but he does continue to work and has been having more difficulty with this.   He tries to avoid getting on ladders at work.  Work itself has just become more difficult.  When he has more stress, "its like turning on a switch" and he has more tremor.  He has thus far declined more recent MRI of the brain.  Current prescribed movement disorder medications: Carbidopa/levodopa 25/100, 2 tablets at 6 AM/9:30 AM/1 PM/4:30 PM Nuplazid   PREVIOUS MEDICATIONS: pramipexole (increased hallucinations)  ALLERGIES:   Allergies  Allergen Reactions   Hydrocodone Nausea Only    CURRENT MEDICATIONS:  Outpatient Encounter Medications as of 12/27/2021  Medication Sig   aspirin EC 81 MG tablet Take 81 mg by mouth daily.   carbidopa-levodopa (SINEMET IR) 25-100 MG tablet TAKE 2 TABLETS BY MOUTH 4 TIMES DAILY     6AM/9:30AM/1PM/4:30-5PM   losartan-hydrochlorothiazide (HYZAAR) 100-25 MG tablet TAKE 1 TABLET  BY MOUTH ONCE A DAY   NUPLAZID 34 MG CAPS TAKE 1 CAPSULE BY MOUTH EVERY DAY   pramipexole (MIRAPEX) 0.5 MG tablet TAKE 1 TABLET BY MOUTH 3 TIMES DAILY   vitamin B-12 (CYANOCOBALAMIN) 1000 MCG tablet Take 1,000 mcg by mouth daily.   Pimavanserin Tartrate (NUPLAZID) 34 MG CAPS Samples of this drug were given to the patient, quantity 2 boxes (14 capsules total), Lot Number [id number] exp:5/24  Patient directed on taking one capsule daily.   [DISCONTINUED] potassium chloride (K-DUR) 10 MEQ tablet Take 1 tablet (10 mEq total) by mouth 2 (two) times daily.   No facility-administered encounter medications on file as of 12/27/2021.    Objective:   PHYSICAL EXAMINATION:    VITALS:   Vitals:   12/27/21 1522  BP: (!) 146/78  Pulse: 60  SpO2: 96%  Weight: 241 lb 3.2 oz (109.4 kg)  Height: '5\' 11"'$  (1.803 m)       GEN:  The patient appears stated age and is in NAD. HEENT:  Normocephalic, atraumatic.  Drooling is noted.  The mucous membranes are moist. The superficial temporal arteries are without ropiness or tenderness. CV:  RRR Lungs:  CTAB Neck/HEME:  There are no carotid bruits bilaterally.  Neck is flexed.   Neurological examination:  Orientation: The patient is alert and oriented x3. Cranial nerves: There is good facial symmetry with significant facial hypomimia. The speech is fluent and clear.  He is hypophonic.  Soft palate rises symmetrically and there is no tongue deviation. Hearing is intact to conversational tone. Sensation: Sensation is intact to light touch throughout Motor: Strength is at least antigravity x4.  Movement examination: Tone: He has increased tone, mild right greater than left. Abnormal movements: He has tremor on the right hand. Coordination:  There is mild decremation on the right Gait and Station: The patient has no difficulty arising out of a deep-seated chair without the use of the hands. he does have some mild decreased stride length and is forward flexed.   He is shuffling some.  I have reviewed and interpreted the following labs independently    Chemistry      Component Value Date/Time   NA 139 10/28/2021 0931   K 4.1 10/28/2021 0931   CL 100 10/28/2021 0931   CO2 30 10/28/2021 0931   BUN 17 10/28/2021 0931   CREATININE 1.09 10/28/2021 0931   CREATININE 0.91 08/14/2019 1534      Component Value Date/Time   CALCIUM 9.8 10/28/2021 0931  ALKPHOS 35 (L) 10/28/2021 0931   AST 24 10/28/2021 0931   ALT 8 10/28/2021 0931   BILITOT 1.0 10/28/2021 0931       Lab Results  Component Value Date   WBC 4.8 10/28/2021   HGB 15.8 10/28/2021   HCT 46.4 10/28/2021   MCV 96.5 10/28/2021   PLT 134.0 (L) 10/28/2021    Lab Results  Component Value Date   TSH 1.29 08/14/2019     Total time spent on today's visit was 32 minutes, including both face-to-face time and nonface-to-face time.  Time included that spent on review of records (prior notes available to me/labs/imaging if pertinent), discussing treatment and goals, answering patient's questions and coordinating care.  Cc:  [name], MD

## 2021-12-31 ENCOUNTER — Other Ambulatory Visit: Payer: Self-pay | Admitting: Neurology

## 2022-01-02 ENCOUNTER — Encounter: Payer: 59 | Admitting: Internal Medicine

## 2022-01-18 ENCOUNTER — Other Ambulatory Visit: Payer: Self-pay

## 2022-01-23 ENCOUNTER — Other Ambulatory Visit: Payer: Self-pay

## 2022-01-23 ENCOUNTER — Telehealth: Payer: Self-pay | Admitting: Neurology

## 2022-01-23 DIAGNOSIS — R441 Visual hallucinations: Secondary | ICD-10-CM

## 2022-01-23 MED ORDER — NUPLAZID 34 MG PO CAPS: 34.0000 mg | ORAL_CAPSULE | Freq: Every day | ORAL | 0 refills | Status: DC

## 2022-01-23 NOTE — Telephone Encounter (Signed)
Patient stated for about the last week he's been experiencing screaming pain in his legs and hips. It came on gradually but is bad now.  He'd like Dr. Doristine Devoid help.

## 2022-01-23 NOTE — Telephone Encounter (Signed)
Patient was wanting to know if the pain coming from the carbidopa levodopa 50/200 CR. Pain shoots up from his foot into his hips and wanted to know if this is parkinson's

## 2022-01-23 NOTE — Telephone Encounter (Signed)
Called patient and let him know Dr. Arturo Morton recommendations

## 2022-01-25 ENCOUNTER — Telehealth: Payer: Self-pay | Admitting: Neurology

## 2022-01-25 NOTE — Telephone Encounter (Signed)
Patient called requesting the name of a disability lawyer recommended by Dr. Carles Collet.

## 2022-01-26 NOTE — Telephone Encounter (Signed)
Called Pateint back and left voicemail message

## 2022-03-30 ENCOUNTER — Ambulatory Visit (INDEPENDENT_AMBULATORY_CARE_PROVIDER_SITE_OTHER): Payer: 59

## 2022-03-30 DIAGNOSIS — Z23 Encounter for immunization: Secondary | ICD-10-CM | POA: Diagnosis not present

## 2022-03-30 NOTE — Progress Notes (Signed)
Patient presented for 2nd shingles vaccine given by Sherrilee Gilles, CMA to right deltoid, patient voiced no concerns nor showed any signs of distress during injection.

## 2022-04-11 ENCOUNTER — Other Ambulatory Visit: Payer: Self-pay

## 2022-04-11 DIAGNOSIS — R441 Visual hallucinations: Secondary | ICD-10-CM

## 2022-04-11 MED ORDER — NUPLAZID 34 MG PO CAPS: 34.0000 mg | ORAL_CAPSULE | Freq: Every day | ORAL | 1 refills | Status: DC

## 2022-04-13 ENCOUNTER — Telehealth: Payer: Self-pay | Admitting: Anesthesiology

## 2022-04-13 NOTE — Telephone Encounter (Signed)
Called pateint and he said he has had no changes and two days ago tremor got worse and now is really bad. Patient said he usually only needs extra medication if he is doing something to cause him anxiety but he hasn't had any recently and his tremor has just gotten worse

## 2022-04-13 NOTE — Telephone Encounter (Signed)
Called patients and gave Dr. Arturo Morton recommendations he is calling PCP

## 2022-04-13 NOTE — Telephone Encounter (Signed)
Patient left message stating he would like to ask Dr Tat if she can increase the dosage on his tremor medication. Pt requests a call back.

## 2022-04-14 ENCOUNTER — Encounter: Payer: Self-pay | Admitting: Internal Medicine

## 2022-04-14 ENCOUNTER — Ambulatory Visit: Payer: 59 | Admitting: Internal Medicine

## 2022-04-14 VITALS — BP 134/84 | HR 58 | Temp 97.3°F | Ht 71.0 in | Wt 245.0 lb

## 2022-04-14 DIAGNOSIS — G20B2 Parkinson's disease with dyskinesia, with fluctuations: Secondary | ICD-10-CM | POA: Diagnosis not present

## 2022-04-14 LAB — COMPREHENSIVE METABOLIC PANEL
ALT: 6 U/L (ref 0–53)
AST: 15 U/L (ref 0–37)
Albumin: 4.8 g/dL (ref 3.5–5.2)
Alkaline Phosphatase: 33 U/L — ABNORMAL LOW (ref 39–117)
BUN: 16 mg/dL (ref 6–23)
CO2: 30 mEq/L (ref 19–32)
Calcium: 9.3 mg/dL (ref 8.4–10.5)
Chloride: 101 mEq/L (ref 96–112)
Creatinine, Ser: 0.95 mg/dL (ref 0.40–1.50)
GFR: 85.39 mL/min (ref >60.00)
Glucose, Bld: 96 mg/dL (ref 70–99)
Potassium: 4 mEq/L (ref 3.5–5.1)
Sodium: 138 mEq/L (ref 135–145)
Total Bilirubin: 1 mg/dL (ref 0.2–1.2)
Total Protein: 7.6 g/dL (ref 6.0–8.3)

## 2022-04-14 LAB — CBC
HCT: 46 % (ref 39.0–52.0)
Hemoglobin: 15.9 g/dL (ref 13.0–17.0)
MCHC: 34.6 g/dL (ref 30.0–36.0)
MCV: 96.5 fl (ref 78.0–100.0)
Platelets: 194 10*3/uL (ref 150.0–400.0)
RBC: 4.77 Mil/uL (ref 4.22–5.81)
RDW: 12.4 % (ref 11.5–15.5)
WBC: 7.2 10*3/uL (ref 4.0–10.5)

## 2022-04-14 LAB — TSH: TSH: 1.66 u[IU]/mL (ref 0.35–5.50)

## 2022-04-14 LAB — SEDIMENTATION RATE: Sed Rate: 14 mm/hr (ref 0–20)

## 2022-04-14 NOTE — Progress Notes (Signed)
Subjective:    Patient ID: [name] [name], male    DOB: 1960/11/15, 61 y.o.   MRN: [id number]  HPI Here due to worsening of tremors--Dr Tat thinks evaluation for secondary cause is appropriate  Tremors are worse-- worse in past month Yesterday "it was just violent" Having trouble eating due to this  Now on long acting sinemet at night Less problems at night  Was taken off pramipexole---seemed to help hallucinations--was still on his list (but he thinks he stopped it) Started on nuplazid--seemed to help Still on 2 of the 25/100 sinemet four times a day Does note "on" period--they help in the short term He notes "anxiety" worsens the tremor Work has been difficult--doesn't think he is in trouble due to this  Down to 2-3 vodka drinks a day (1 shot each??)  Current Outpatient Medications on File Prior to Visit  Medication Sig Dispense Refill   aspirin EC 81 MG tablet Take 81 mg by mouth daily.     carbidopa-levodopa (SINEMET CR) 50-200 MG tablet Take 1 tablet by mouth at bedtime. 90 tablet 2   carbidopa-levodopa (SINEMET IR) 25-100 MG tablet TAKE 2 TABLETS BY MOUTH 4 TIMES DAILY     6AM/9:30AM/1PM/4:30-5PM 720 tablet 0   losartan-hydrochlorothiazide (HYZAAR) 100-25 MG tablet TAKE 1 TABLET BY MOUTH ONCE A DAY 90 tablet 3   NUPLAZID 34 MG CAPS Take 1 capsule (34 mg total) by mouth daily. 90 capsule 1   vitamin B-12 (CYANOCOBALAMIN) 1000 MCG tablet Take 1,000 mcg by mouth daily.     [DISCONTINUED] potassium chloride (K-DUR) 10 MEQ tablet Take 1 tablet (10 mEq total) by mouth 2 (two) times daily. 60 tablet 2   No current facility-administered medications on file prior to visit.    Allergies  Allergen Reactions   Hydrocodone Nausea Only    Past Medical History:  Diagnosis Date   Alcohol abuse 09/08/2019   Episodes of formed visual hallucinations    Essential hypertension, benign 01/31/2007   Herniated disc, cervical    History of skin cancer    basal cell; face and abdomen    Hyperlipidemia    Mild neurocognitive disorder with Lewy bodies, possible 05/03/2021   Parkinson's disease 04/27/2016   REM sleep behaviors    Syringomyelia 09/08/2019   Tremor of right hand 06/21/2015    Past Surgical History:  Procedure Laterality Date   BASAL CELL CARCINOMA EXCISION     EYE SURGERY     as a child   FINGER FRACTURE SURGERY     5th digit, left hand, x2   HERNIA REPAIR     age 50    Family History  Problem Relation Age of Onset   Hypertension Mother    Diabetes Father    Hypertension Father    Heart attack Father    Parkinson's disease Maternal Grandmother    Parkinsonism Maternal Grandmother     Social History   Socioeconomic History   Marital status: Married    Spouse name: Not on file   Number of children: 1   Years of education: 12   Highest education level: High school graduate  Occupational History   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  Cardiovascular:     Rate and Rhythm: Normal rate and regular rhythm.     Heart sounds: No murmur heard.    No gallop.  Pulmonary:     Effort: Pulmonary effort is normal.     Breath sounds: Normal breath sounds. No wheezing or rales.  Abdominal:     Palpations: Abdomen is soft.     Tenderness: There is no abdominal tenderness.  Musculoskeletal:     Cervical back: Neck supple.     Right lower leg: No edema.     Left lower leg: No edema.  Lymphadenopathy:     Cervical: No cervical adenopathy.  Neurological:     Mental Status: He is alert.     Comments: Some bradykinesia Resting tremor mostly in hands Gait slow but not really shuffling  Psychiatric:        Mood and Affect: Mood normal.            Assessment & Plan:

## 2022-04-14 NOTE — Assessment & Plan Note (Signed)
Recent worsening (1 month) No recent med changes--does like the CR at bedtime I can't really get the sense of whether he is having more off times in between doses--he is vague Some concern about remembering meds---mirapex was still on list, but he thinks he has stopped it Will check some basic labs--no apparent illness to account for this Will send note to Dr Tat

## 2022-04-17 ENCOUNTER — Telehealth: Payer: Self-pay | Admitting: Anesthesiology

## 2022-04-17 ENCOUNTER — Other Ambulatory Visit: Payer: Self-pay

## 2022-04-17 DIAGNOSIS — R441 Visual hallucinations: Secondary | ICD-10-CM

## 2022-04-17 MED ORDER — NUPLAZID 34 MG PO CAPS: 34.0000 mg | ORAL_CAPSULE | Freq: Every day | ORAL | 1 refills | Status: DC

## 2022-04-17 NOTE — Telephone Encounter (Signed)
Patient called stating he has 5 pills left and needs a refill that has a PA.   1. Which medications need refilled? Nuplazid 34 mg  2. Which pharmacy/location is medication to be sent to? [facility] mail order   3. Do they need a 30 day or 90 day supply? 30 day supply

## 2022-04-17 NOTE — Telephone Encounter (Signed)
Called patient sent prescription in

## 2022-04-21 ENCOUNTER — Other Ambulatory Visit: Payer: Self-pay | Admitting: Neurology

## 2022-04-21 DIAGNOSIS — G20A1 Parkinson's disease without dyskinesia, without mention of fluctuations: Secondary | ICD-10-CM

## 2022-04-26 ENCOUNTER — Other Ambulatory Visit (HOSPITAL_COMMUNITY): Payer: Self-pay

## 2022-06-06 ENCOUNTER — Other Ambulatory Visit: Payer: Self-pay | Admitting: Neurology

## 2022-06-06 DIAGNOSIS — R441 Visual hallucinations: Secondary | ICD-10-CM

## 2022-06-08 ENCOUNTER — Other Ambulatory Visit (HOSPITAL_COMMUNITY): Payer: Self-pay

## 2022-06-08 ENCOUNTER — Telehealth: Payer: Self-pay

## 2022-06-08 NOTE — Telephone Encounter (Signed)
Pharmacy Patient Advocate Encounter   Received notification from [place] that prior authorization for Nuplazid '34MG'$  capsules is required/requested.    PA submitted on 06/08/2022 to (ins) [employer] via CoverMyMeds Key [id number] Status is pending

## 2022-06-09 ENCOUNTER — Other Ambulatory Visit (HOSPITAL_COMMUNITY): Payer: Self-pay

## 2022-06-09 NOTE — Telephone Encounter (Signed)
Pharmacy Patient Advocate Encounter  Prior Authorization for Nuplazid '34MG'$  capsules has been approved.    PA# [id number] Effective dates: 06/08/2022 through 06/09/2023  NUPLAZID (pimavanserin) is available at the following 5 specialty pharmacies: - [place]

## 2022-06-16 ENCOUNTER — Other Ambulatory Visit: Payer: Self-pay

## 2022-06-16 DIAGNOSIS — G20A1 Parkinson's disease without dyskinesia, without mention of fluctuations: Secondary | ICD-10-CM

## 2022-06-16 MED ORDER — CARBIDOPA-LEVODOPA 25-100 MG PO TABS: ORAL_TABLET | ORAL | 0 refills | Status: DC

## 2022-07-03 NOTE — Progress Notes (Unsigned)
Assessment/Plan:   1.  Parkinsons Disease             -continue carbidopa/levodopa 25/100, 2 tablets at 6 AM/9:30 AM/1 PM/4:30 PM.   He can take half tablet extra if needed on days that tremor is more bothersome.  -Continue carbidopa/levodopa 50/200 CR about 30 minutes before bedtime  -Start Inbrija.  Shown how to use this today.  I was out of samples, so we will need to wait until they come into the office and we will call him.  He can use this up to 5 times per day.  He should definitely use it before he gets into bed.  -Patient really has declined with the course of time and does not think that he is going to be able to continue his job, and I agree with this.  His job is very physical.  It requires getting up on ladders, and I absolutely do not want him doing that.  His coworkers are protective of him, but he does not think he is going to be able to continue to do this.  I really think that he needs to look into disability and encouraged him to do this.  We discussed that again today.  -RX over the bed trapeze.   2.  Hallucinations             -On Nuplazid.  Hallucinations are actually much better now that he is off of pramipexole.   3.  Alcohol overuse             -Discussed again today that I want to see him off of alcohol altogether.   4.  B12 deficiency             -Recommend oral B12 supplementation.   5.  Cervical stenosis with syringomyelia             -Really would like to repeat this MRI of the cervical spine.  He and I talked about that for a long time.  He is seriously going to think about that.   6.  Cognitive impairment  -Dr. Melvyn Novas did memory testing in December, 2022.  While that only revealed MCI, he was concerned with dementia with Lewy bodies.  While I really do not think he meets criteria for that as he did not have profound memory impairment early in the disease, and really has not had memory interfere with social and occupational or ADLs (requirements for LBD), I do  have concerns that memory impairment is profound enough to not be a candidate for DBS.  7.  Sialorrhea  -Was fairly significant in the office today.  Botox offered.  Patient does not want that right now.  States that it is not that bothersome to him    Subjective:   [name] [name] was seen today in follow up for Parkinsons disease.  My previous records were reviewed prior to todays visit as well as outside records available to me.  Pt with wife supplements hx.   No falls since last visit.  He does act out his dreams, but no falling out of the bed.  Memory has not been good, but he does continue to work and has been having more difficulty with this.   He tries to avoid getting on ladders at work.  Work itself has just become more difficult.  When he has more stress, "its like turning on a switch" and he has more tremor.  He has thus far declined more  recent MRI of the brain.  He is still working and states that he accomplishes it "with a lot of extra labor."  States that employer wouldn't tell him if not happy with job.   Re: hallucinations, he will see people outside the window at night.  Wife thinks that the shadow of the trees at night in that location make it worse.  The worst is the memory and fear of getting lost.  Trouble getting lunch to the mouth because of shaking too much.    Current prescribed movement disorder medications: Carbidopa/levodopa 25/100, 2 tablets at 6 AM/9:30 AM/1 PM/4:30 PM (he is taking 2.5 tablets at the 1pm dose) Carbidopa/levodopa 50/200 CR at bedtime (added last visit) Nuplazid   PREVIOUS MEDICATIONS: pramipexole (increased hallucinations)  ALLERGIES:   Allergies  Allergen Reactions   Hydrocodone Nausea Only    CURRENT MEDICATIONS:  Outpatient Encounter Medications as of 07/04/2022  Medication Sig   aspirin EC 81 MG tablet Take 81 mg by mouth daily.   carbidopa-levodopa (SINEMET CR) 50-200 MG tablet Take 1 tablet by mouth at bedtime.   carbidopa-levodopa  (SINEMET IR) 25-100 MG tablet Take  2 tablets at 6 AM/Take 2 tablets at 9:30 AM/ Take 2 tablets at 1 PM/ Take 2 tablets at 4:30 PM.   He can take half tablet extra if needed   losartan-hydrochlorothiazide (HYZAAR) 100-25 MG tablet TAKE 1 TABLET BY MOUTH ONCE A DAY   NUPLAZID 34 MG CAPS TAKE 1 CAPSULE (34 MG TOTAL) BY MOUTH DAILY.   vitamin B-12 (CYANOCOBALAMIN) 1000 MCG tablet Take 1,000 mcg by mouth daily.   [DISCONTINUED] potassium chloride (K-DUR) 10 MEQ tablet Take 1 tablet (10 mEq total) by mouth 2 (two) times daily.   No facility-administered encounter medications on file as of 07/04/2022.    Objective:   PHYSICAL EXAMINATION:    VITALS:   Vitals:   07/04/22 1431  BP: 138/78  Pulse: 61  SpO2: 97%  Weight: 246 lb 8 oz (111.8 kg)  Height: '5\' 11"'$  (1.803 m)    GEN:  The patient appears stated age and is in NAD. HEENT:  Normocephalic, atraumatic.  Drooling is noted significantly (similar to prior).  The mucous membranes are moist. The superficial temporal arteries are without ropiness or tenderness.   Neurological examination:  Orientation: The patient is alert and oriented x3. Cranial nerves: There is good facial symmetry with significant facial hypomimia. The speech is fluent and clear.  He is hypophonic.  Soft palate rises symmetrically and there is no tongue deviation. Hearing is intact to conversational tone. Sensation: Sensation is intact to light touch throughout Motor: Strength is at least antigravity x4.  Movement examination: Tone: He has mild increased tone on the R Abnormal movements: He has no tremor today Coordination:  There is mild decremation on the right hand with finger taps and L foot with toe taps Gait and Station: The patient has no difficulty arising out of a deep-seated chair without the use of the hands. he does have some mild decreased stride length and is forward flexed.  He is not shuffling much but is dragging the feet  I have reviewed and  interpreted the following labs independently    Chemistry      Component Value Date/Time   NA 138 04/14/2022 1051   K 4.0 04/14/2022 1051   CL 101 04/14/2022 1051   CO2 30 04/14/2022 1051   BUN 16 04/14/2022 1051   CREATININE 0.95 04/14/2022 1051   CREATININE 0.91 08/14/2019 1534  Component Value Date/Time   CALCIUM 9.3 04/14/2022 1051   ALKPHOS 33 (L) 04/14/2022 1051   AST 15 04/14/2022 1051   ALT 6 04/14/2022 1051   BILITOT 1.0 04/14/2022 1051       Lab Results  Component Value Date   WBC 7.2 04/14/2022   HGB 15.9 04/14/2022   HCT 46.0 04/14/2022   MCV 96.5 04/14/2022   PLT 194.0 04/14/2022    Lab Results  Component Value Date   TSH 1.66 04/14/2022     Total time spent on today's visit was 34 minutes, including both face-to-face time and nonface-to-face time.  Time included that spent on review of records (prior notes available to me/labs/imaging if pertinent), discussing treatment and goals, answering patient's questions and coordinating care.  Cc:  [name], MD

## 2022-07-04 ENCOUNTER — Ambulatory Visit (INDEPENDENT_AMBULATORY_CARE_PROVIDER_SITE_OTHER): Payer: 59 | Admitting: Neurology

## 2022-07-04 ENCOUNTER — Encounter: Payer: Self-pay | Admitting: Neurology

## 2022-07-04 VITALS — BP 138/78 | HR 61 | Ht 71.0 in | Wt 246.5 lb

## 2022-07-04 DIAGNOSIS — R413 Other amnesia: Secondary | ICD-10-CM

## 2022-07-04 DIAGNOSIS — K117 Disturbances of salivary secretion: Secondary | ICD-10-CM

## 2022-07-04 DIAGNOSIS — G20A2 Parkinson's disease without dyskinesia, with fluctuations: Secondary | ICD-10-CM

## 2022-07-04 NOTE — Patient Instructions (Addendum)
We are going to start inbrija.  Remember that TWO capsules is ONE dosage (never inhale just one capsule).  You can inhale the capsules as needed up to 5 times per day, separated by 2 hour intervals.  Many patients use this right when the wake up to help with first morning "on" and then as needed during the day.  You should take a sip of water prior to using the inhaler to avoid side effects.  It may generate some cough right when you use it and that is normal.  There are nurse educators available to help you with this device.  You can call [phone] and they will set you up with a nurse educator to assist you for free of charge.  They are available 8am-8pm Monday-Friday.  Take your carbidopa/levodopa 50/200 about 30 min before bedtime  I will look into the trapeze over the bed

## 2022-07-06 ENCOUNTER — Other Ambulatory Visit: Payer: Self-pay

## 2022-07-06 DIAGNOSIS — G20A2 Parkinson's disease without dyskinesia, with fluctuations: Secondary | ICD-10-CM

## 2022-07-07 ENCOUNTER — Other Ambulatory Visit: Payer: Self-pay

## 2022-07-07 DIAGNOSIS — G20A2 Parkinson's disease without dyskinesia, with fluctuations: Secondary | ICD-10-CM

## 2022-07-07 MED ORDER — INBRIJA 42 MG IN CAPS: ORAL_CAPSULE | RESPIRATORY_TRACT | 0 refills | Status: DC

## 2022-07-26 ENCOUNTER — Telehealth: Payer: Self-pay | Admitting: Anesthesiology

## 2022-07-26 NOTE — Telephone Encounter (Signed)
Pt called stating he has questions regarding the new medication Inbrija.

## 2022-07-26 NOTE — Telephone Encounter (Signed)
Patient said [name] is working for [employer] I am sending paperwork into the hub for insurance verification

## 2022-07-27 ENCOUNTER — Other Ambulatory Visit: Payer: Self-pay

## 2022-07-27 ENCOUNTER — Telehealth: Payer: Self-pay | Admitting: Neurology

## 2022-07-27 MED ORDER — INBRIJA 42 MG IN CAPS: ORAL_CAPSULE | RESPIRATORY_TRACT | 1 refills | Status: DC

## 2022-07-27 NOTE — Telephone Encounter (Signed)
Called patient and told him information has been sent and waiting on response

## 2022-07-27 NOTE — Telephone Encounter (Signed)
Pt called in and left a message. He is wanting an update on the [place].

## 2022-08-16 ENCOUNTER — Other Ambulatory Visit: Payer: Self-pay | Admitting: Neurology

## 2022-08-22 ENCOUNTER — Other Ambulatory Visit: Payer: Self-pay | Admitting: Neurology

## 2022-08-22 DIAGNOSIS — R441 Visual hallucinations: Secondary | ICD-10-CM

## 2022-08-23 ENCOUNTER — Telehealth: Payer: Self-pay | Admitting: Neurology

## 2022-08-23 NOTE — Telephone Encounter (Signed)
Called patient back and went over meds

## 2022-08-23 NOTE — Telephone Encounter (Signed)
Pt called in and left a message with the access nurse. He would like a list of his current medications.

## 2022-09-15 ENCOUNTER — Other Ambulatory Visit: Payer: Self-pay | Admitting: Neurology

## 2022-09-15 DIAGNOSIS — G20A1 Parkinson's disease without dyskinesia, without mention of fluctuations: Secondary | ICD-10-CM

## 2022-10-12 ENCOUNTER — Other Ambulatory Visit: Payer: Self-pay | Admitting: Internal Medicine

## 2022-10-16 ENCOUNTER — Telehealth: Payer: Self-pay | Admitting: Neurology

## 2022-10-16 NOTE — Telephone Encounter (Signed)
Medication refill sent to pharmacy. He needs a CPE in the next 90 days. Thanks.

## 2022-10-16 NOTE — Telephone Encounter (Signed)
Pt called in stating his lower back and hip are in pain. He feels like the pain is where his hip bone goes into his hip socket.

## 2022-10-16 NOTE — Telephone Encounter (Signed)
Refer patient to Ortho or PCP?

## 2022-10-16 NOTE — Telephone Encounter (Signed)
Lvmtcb, sent mychart message  

## 2022-10-16 NOTE — Telephone Encounter (Signed)
Called pateint And left message to call PCP about hid pain

## 2022-10-17 ENCOUNTER — Encounter: Payer: Self-pay | Admitting: Internal Medicine

## 2022-10-17 ENCOUNTER — Ambulatory Visit: Payer: 59 | Admitting: Internal Medicine

## 2022-10-17 VITALS — BP 130/86 | HR 48 | Temp 97.5°F | Ht 71.0 in | Wt 250.0 lb

## 2022-10-17 DIAGNOSIS — M5442 Lumbago with sciatica, left side: Secondary | ICD-10-CM | POA: Diagnosis not present

## 2022-10-17 DIAGNOSIS — M549 Dorsalgia, unspecified: Secondary | ICD-10-CM | POA: Insufficient documentation

## 2022-10-17 NOTE — Progress Notes (Signed)
Subjective:    Patient ID: [name] [name], male    DOB: [birth year], 62 y.o.   MRN: [id number]  HPI Here due to left hip and shoulder pain  Feels it is centralized in the left posterior buttocks--down the leg (close to foot) Also in shoulder  Burning quality May get worse coming down big step out of truck---also if pushing up when bending down May continue for a while Not in bed unless he had a bad day and it was already bad Goes back 6 months  No clear arm or leg weakness  Known syrinx of cervical spine that was treated with anterior needle procedure (?decompression)  Current Outpatient Medications on File Prior to Visit  Medication Sig Dispense Refill   aspirin EC 81 MG tablet Take 81 mg by mouth daily.     carbidopa-levodopa (SINEMET CR) 50-200 MG tablet TAKE ONE TABLET BY MOUTH EVERY NIGHT AT BEDTIME 90 tablet 0   carbidopa-levodopa (SINEMET IR) 25-100 MG tablet TAKE TWO TABLETS BY MOUTH AT THE FOLLOWING TIMES. 6AM/9:30AM/1PM/4:30PM. TAKE AN ADDITIONAL ONE HALF (1/2) TABLET AS NEEDED. 765 tablet 0   Levodopa (INBRIJA) 42 MG CAPS Samples of this drug were given to the patient, quantity 1, Lot Number [id number] Exp date 04/29/2023 60 capsule 0   Levodopa (INBRIJA) 42 MG CAPS You can inhale the capsules as needed up to 5 times per day, separated by 2 hour intervals.  Many patients use this right when the wake up to help with first morning "on" and then as needed during the day. 300 capsule 1   losartan-hydrochlorothiazide (HYZAAR) 100-25 MG tablet TAKE ONE TABLET BY MOUTH ONCE A DAY 90 tablet 0   Pimavanserin Tartrate (NUPLAZID) 34 MG CAPS TAKE 1 CAPSULE BY MOUTH DAILY 90 capsule 0   vitamin B-12 (CYANOCOBALAMIN) 1000 MCG tablet Take 1,000 mcg by mouth daily.     [DISCONTINUED] potassium chloride (K-DUR) 10 MEQ tablet Take 1 tablet (10 mEq total) by mouth 2 (two) times daily. 60 tablet 2   No current facility-administered medications on file prior to visit.    Allergies   Allergen Reactions   Hydrocodone Nausea Only    Past Medical History:  Diagnosis Date   Alcohol abuse 09/08/2019   Episodes of formed visual hallucinations    Essential hypertension, benign 01/31/2007   Herniated disc, cervical    History of skin cancer    basal cell; face and abdomen   Hyperlipidemia    Mild neurocognitive disorder with Lewy bodies, possible 05/03/2021   Parkinson's disease 04/27/2016   REM sleep behaviors    Syringomyelia 09/08/2019   Tremor of right hand 06/21/2015    Past Surgical History:  Procedure Laterality Date   BASAL CELL CARCINOMA EXCISION     EYE SURGERY     as a child   FINGER FRACTURE SURGERY     5th digit, left hand, x2   HERNIA REPAIR     age 50    Family History  Problem Relation Age of Onset   Hypertension Mother    Diabetes Father    Hypertension Father    Heart attack Father    Parkinson's disease Maternal Grandmother    Parkinsonism Maternal Grandmother     Social History   Socioeconomic History   Marital status: Married    Spouse name: Not on file   Number of children: 1   Years of education: 12   Highest education level: High school graduate  Occupational History   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   Right handed   Two story home   Social Determinants of Health   Financial Resource Strain:  Not on file  Food Insecurity: Not on file  Transportation Needs: Not on file  Physical Activity: Not on file  Stress: Not on file  Social Connections: Not on file  Intimate Partner Violence: Not on file   Review of Systems Parkinson's is worsening Stopped working a month ago--filed for disability     Objective:   Physical Exam Neck:     Comments: Stiff with especially reduced extension No tenderness Musculoskeletal:     Comments: Reasonable ROM n left shoulder---just increased tone  No left hip tenderness ROM fairly good---but slight pain with full internal rotation  Neurological:     Comments: Increased tone Gait is fairly good No leg/arm weakness            Assessment & Plan:

## 2022-10-17 NOTE — Assessment & Plan Note (Addendum)
Burning quality and worsening suggests radicular component Shoulder involvement makes me concerned about cervical disease---especially with known past compression and syrinx Will ask Dr Tat to reevaluate him He really wants to avoid MRI (and we certainly don't want to rush to a procedure) I am hesitant to start medication for radiculopathy---and feel better about letting Dr Tat reevaluate him  He gets some relief from 2 tylenol/3 ibuprofen --okay to continue for now

## 2022-10-17 NOTE — Progress Notes (Deleted)
Assessment/Plan:   1.  Parkinsons Disease             -continue carbidopa/levodopa 25/100, 2 tablets at 6 AM/9:30 AM/1 PM/4:30 PM.   He can take half tablet extra if needed on days that tremor is more bothersome.  -Continue carbidopa/levodopa 50/200 CR about 30 minutes before bedtime  -***Inbrija  -Patient really has declined with the course of time and does not think that he is going to be able to continue his job, and I agree with this.  His job is very physical.  It requires getting up on ladders, and I absolutely do not want him doing that.  His coworkers are protective of him, but he does not think he is going to be able to continue to do this.  I really think that he needs to look into disability and encouraged him to do this.  We discussed that again today.  -RX over the bed trapeze.   2.  Hallucinations             -On Nuplazid.  Hallucinations are actually much better now that he is off of pramipexole.   3.  Alcohol overuse             -Discussed again today that I want to see him off of alcohol altogether.   4.  B12 deficiency             -Recommend oral B12 supplementation.   5.  Cervical stenosis with syringomyelia             -Really would like to repeat this MRI of the cervical spine.  He and I talked about that for a long time.  He is seriously going to think about that.   6.  Cognitive impairment  -Dr. Milbert Coulter did memory testing in December, 2022.  While that only revealed MCI, he was concerned with dementia with Lewy bodies.  While I really do not think he meets criteria for that as he did not have profound memory impairment early in the disease, and really has not had memory interfere with social and occupational or ADLs (requirements for LBD), I do have concerns that memory impairment is profound enough to not be a candidate for DBS.  7.  Sialorrhea  -Was fairly significant in the office today.  Botox offered.  Patient does not want that right now.  States that it is  not that bothersome to him  8.  Low back pain with possible lumbar radiculopathy  -Discussed EMG  -Discussed physical therapy and sports medicine referral  -Discussed MRI lumbar spine  Subjective:   [name] [name] was seen today.  My previous records were reviewed prior to todays visit as well as outside records available to me.  Patient worked in today at request of primary care.  Patient did call me a few days ago about hip pain, but I told him that he would need to follow-up with primary care.  The patient did that and saw primary care 2 days ago who felt that it was not hip pain, but rather radicular pain on the left.  Primary care noted that patient also was complaining about shoulder pain/involvement and was concerned about cervical disease, but patient did not want to proceed with MRI.  He is here for further evaluation.  Separate from the above, I gave the patient samples of Inbrija last visit.  He reports today that ***.  Current prescribed movement disorder medications: Carbidopa/levodopa 25/100, 2  tablets at 6 AM/9:30 AM/1 PM/4:30 PM (he is taking 2.5 tablets at the 1pm dose) Carbidopa/levodopa 50/200 CR at bedtime (added last visit) Nuplazid   PREVIOUS MEDICATIONS: pramipexole (increased hallucinations)  ALLERGIES:   Allergies  Allergen Reactions   Hydrocodone Nausea Only    CURRENT MEDICATIONS:  Outpatient Encounter Medications as of 10/19/2022  Medication Sig   aspirin EC 81 MG tablet Take 81 mg by mouth daily.   carbidopa-levodopa (SINEMET CR) 50-200 MG tablet TAKE ONE TABLET BY MOUTH EVERY NIGHT AT BEDTIME   carbidopa-levodopa (SINEMET IR) 25-100 MG tablet TAKE TWO TABLETS BY MOUTH AT THE FOLLOWING TIMES. 6AM/9:30AM/1PM/4:30PM. TAKE AN ADDITIONAL ONE HALF (1/2) TABLET AS NEEDED.   Levodopa (INBRIJA) 42 MG CAPS Samples of this drug were given to the patient, quantity 1, Lot Number [id number] Exp date 04/29/2023   Levodopa (INBRIJA) 42 MG CAPS You can inhale the  capsules as needed up to 5 times per day, separated by 2 hour intervals.  Many patients use this right when the wake up to help with first morning "on" and then as needed during the day.   losartan-hydrochlorothiazide (HYZAAR) 100-25 MG tablet TAKE ONE TABLET BY MOUTH ONCE A DAY   Pimavanserin Tartrate (NUPLAZID) 34 MG CAPS TAKE 1 CAPSULE BY MOUTH DAILY   vitamin B-12 (CYANOCOBALAMIN) 1000 MCG tablet Take 1,000 mcg by mouth daily.   [DISCONTINUED] potassium chloride (K-DUR) 10 MEQ tablet Take 1 tablet (10 mEq total) by mouth 2 (two) times daily.   No facility-administered encounter medications on file as of 10/19/2022.    Objective:   PHYSICAL EXAMINATION:    VITALS:   There were no vitals filed for this visit.   GEN:  The patient appears stated age and is in NAD. HEENT:  Normocephalic, atraumatic.  Drooling is noted significantly (similar to prior).  The mucous membranes are moist. The superficial temporal arteries are without ropiness or tenderness.   Neurological examination:  Orientation: The patient is alert and oriented x3. Cranial nerves: There is good facial symmetry with significant facial hypomimia. The speech is fluent and clear.  He is hypophonic.  Soft palate rises symmetrically and there is no tongue deviation. Hearing is intact to conversational tone. Sensation: Sensation is intact to light touch throughout Motor: Strength is at least antigravity x4.  Movement examination: Tone: He has mild increased tone on the R Abnormal movements: He has no tremor today Coordination:  There is mild decremation on the right hand with finger taps and L foot with toe taps Gait and Station: The patient has no difficulty arising out of a deep-seated chair without the use of the hands. he does have some mild decreased stride length and is forward flexed.  He is not shuffling much but is dragging the feet  I have reviewed and interpreted the following labs independently    Chemistry       Component Value Date/Time   NA 138 04/14/2022 1051   K 4.0 04/14/2022 1051   CL 101 04/14/2022 1051   CO2 30 04/14/2022 1051   BUN 16 04/14/2022 1051   CREATININE 0.95 04/14/2022 1051   CREATININE 0.91 08/14/2019 1534      Component Value Date/Time   CALCIUM 9.3 04/14/2022 1051   ALKPHOS 33 (L) 04/14/2022 1051   AST 15 04/14/2022 1051   ALT 6 04/14/2022 1051   BILITOT 1.0 04/14/2022 1051       Lab Results  Component Value Date   WBC 7.2 04/14/2022   HGB 15.9 04/14/2022  HCT 46.0 04/14/2022   MCV 96.5 04/14/2022   PLT 194.0 04/14/2022    Lab Results  Component Value Date   TSH 1.66 04/14/2022     Total time spent on today's visit was *** minutes, including both face-to-face time and nonface-to-face time.  Time included that spent on review of records (prior notes available to me/labs/imaging if pertinent), discussing treatment and goals, answering patient's questions and coordinating care.  Cc:  Karie Schwalbe, MD

## 2022-10-19 ENCOUNTER — Ambulatory Visit: Payer: 59 | Admitting: Neurology

## 2022-10-19 ENCOUNTER — Encounter: Payer: Self-pay | Admitting: Neurology

## 2022-10-19 DIAGNOSIS — Z029 Encounter for administrative examinations, unspecified: Secondary | ICD-10-CM

## 2022-10-20 ENCOUNTER — Encounter: Payer: Self-pay | Admitting: Family Medicine

## 2022-10-20 ENCOUNTER — Ambulatory Visit: Payer: 59 | Admitting: Family Medicine

## 2022-10-20 ENCOUNTER — Ambulatory Visit (INDEPENDENT_AMBULATORY_CARE_PROVIDER_SITE_OTHER)
Admission: RE | Admit: 2022-10-20 | Discharge: 2022-10-20 | Disposition: A | Discharge status: Home / Self Care | Payer: 59 | Source: Ambulatory Visit | Attending: Family Medicine | Admitting: Family Medicine

## 2022-10-20 VITALS — BP 130/76 | HR 51 | Temp 97.8°F | Ht 71.0 in | Wt 274.2 lb

## 2022-10-20 DIAGNOSIS — M5020 Other cervical disc displacement, unspecified cervical region: Secondary | ICD-10-CM

## 2022-10-20 DIAGNOSIS — M5442 Lumbago with sciatica, left side: Secondary | ICD-10-CM

## 2022-10-20 DIAGNOSIS — M5412 Radiculopathy, cervical region: Secondary | ICD-10-CM

## 2022-10-20 MED ORDER — PREDNISONE 20 MG PO TABS: ORAL_TABLET | ORAL | 0 refills | Status: DC

## 2022-10-20 MED ORDER — CYCLOBENZAPRINE HCL 10 MG PO TABS: 5.0000 mg | ORAL_TABLET | Freq: Two times a day (BID) | ORAL | 0 refills | Status: DC | PRN

## 2022-10-20 NOTE — Patient Instructions (Addendum)
Complete prednisone taper.  Heat on neck and low back.  Can use muscle relaxant as needed for muscle spasm at night.  Can use tylenol Extra strength for  pain 2 tablets twice daily as needed  Consider wearing cervical collar.  Start exercises for low back.   Go to ER if severe pain or new fever.

## 2022-10-20 NOTE — Progress Notes (Signed)
Patient ID: [name] [name], male    DOB: March 21, 1961, 62 y.o.   MRN: [id number]  This visit was conducted in person.  BP 130/76 (BP Location: Left Arm, Patient Position: Sitting, Cuff Size: Large)   Pulse (!) 51   Temp 97.8 F (36.6 C) (Temporal)   Ht 5\' 11"  (1.803 m)   Wt 274 lb 4 oz (124.4 kg)   SpO2 96%   BMI 38.25 kg/m    CC:  Chief Complaint  Patient presents with   Leg Pain    Left-Seen by Dr. Alphonsus Sias 10/17/22    Subjective:   HPI: [name] [name] is a 62 y.o. male patient of Dr. Karle Starch with history of Parkinson's disease presenting on 10/20/2022 for Leg Pain (Left-Seen by Dr. Alphonsus Sias 10/17/22)    Chronic pain in left shoulder, left hip/buttock pain x 2-3 months ago.  No proceeding fall or injury.  Having severe pain in left shoulder.  No weakness or numbness in arm or leg.   Reviewed recent office visit from Dr. Alphonsus Sias Oct 17, 2022      Hx of past neck injury.[name] [name] ESI in past. 2014 MRI lumbar spine evaluated: Evidence of multilevel nerve compression at that time.  Seen by [facility] orthopedics.  Relevant past medical, surgical, family and social history reviewed and updated as indicated. Interim medical history since our last visit reviewed. Allergies and medications reviewed and updated. Outpatient Medications Prior to Visit  Medication Sig Dispense Refill   aspirin EC 81 MG tablet Take 81 mg by mouth daily.     carbidopa-levodopa (SINEMET CR) 50-200 MG tablet TAKE ONE TABLET BY MOUTH EVERY NIGHT AT BEDTIME 90 tablet 0   carbidopa-levodopa (SINEMET IR) 25-100 MG tablet TAKE TWO TABLETS BY MOUTH AT THE FOLLOWING TIMES. 6AM/9:30AM/1PM/4:30PM. TAKE AN ADDITIONAL ONE HALF (1/2) TABLET AS NEEDED. 765 tablet 0   Levodopa (INBRIJA) 42 MG CAPS Samples of this drug were given to the patient, quantity 1, Lot Number [id number] Exp date 04/29/2023 60 capsule 0   Levodopa (INBRIJA) 42 MG CAPS You can inhale the capsules as needed up to 5 times per day, separated by 2 hour  intervals.  Many patients use this right when the wake up to help with first morning "on" and then as needed during the day. 300 capsule 1   losartan-hydrochlorothiazide (HYZAAR) 100-25 MG tablet TAKE ONE TABLET BY MOUTH ONCE A DAY 90 tablet 0   Pimavanserin Tartrate (NUPLAZID) 34 MG CAPS TAKE 1 CAPSULE BY MOUTH DAILY 90 capsule 0   vitamin B-12 (CYANOCOBALAMIN) 1000 MCG tablet Take 1,000 mcg by mouth daily.     No facility-administered medications prior to visit.     Per HPI unless specifically indicated in ROS section below Review of Systems Objective:  BP 130/76 (BP Location: Left Arm, Patient Position: Sitting, Cuff Size: Large)   Pulse (!) 51   Temp 97.8 F (36.6 C) (Temporal)   Ht 5\' 11"  (1.803 m)   Wt 274 lb 4 oz (124.4 kg)   SpO2 96%   BMI 38.25 kg/m   Wt Readings from Last 3 Encounters:  10/20/22 274 lb 4 oz (124.4 kg)  10/17/22 250 lb (113.4 kg)  07/04/22 246 lb 8 oz (111.8 kg)      Physical Exam    Results for orders placed or performed in visit on 04/14/22  Comprehensive metabolic panel  Result Value Ref Range   Sodium 138 135 - 145 mEq/L   Potassium 4.0 3.5 - 5.1 mEq/L  Chloride 101 96 - 112 mEq/L   CO2 30 19 - 32 mEq/L   Glucose, Bld 96 70 - 99 mg/dL   BUN 16 6 - 23 mg/dL   Creatinine, Ser 1.61 0.40 - 1.50 mg/dL   Total Bilirubin 1.0 0.2 - 1.2 mg/dL   Alkaline Phosphatase 33 (L) 39 - 117 U/L   AST 15 0 - 37 U/L   ALT 6 0 - 53 U/L   Total Protein 7.6 6.0 - 8.3 g/dL   Albumin 4.8 3.5 - 5.2 g/dL   GFR 09.60 >45.40 mL/min   Calcium 9.3 8.4 - 10.5 mg/dL  TSH  Result Value Ref Range   TSH 1.66 0.35 - 5.50 uIU/mL  Sedimentation rate  Result Value Ref Range   Sed Rate 14 0 - 20 mm/hr  CBC  Result Value Ref Range   WBC 7.2 4.0 - 10.5 K/uL   RBC 4.77 4.22 - 5.81 Mil/uL   Platelets 194.0 150.0 - 400.0 K/uL   Hemoglobin 15.9 13.0 - 17.0 g/dL   HCT 98.1 19.1 - 47.8 %   MCV 96.5 78.0 - 100.0 fl   MCHC 34.6 30.0 - 36.0 g/dL   RDW 29.5 62.1 - 30.8 %     Assessment and Plan  There are no diagnoses linked to this encounter.  No follow-ups on file.   [name] Nora, MD

## 2022-11-03 ENCOUNTER — Telehealth: Payer: Self-pay | Admitting: Neurology

## 2022-11-03 NOTE — Telephone Encounter (Signed)
Patient wife called and states that patient is having really bad hallucinations. He has appt in July with Tat and is on the wait list . Please call   They use [facility] pharmacy

## 2022-11-03 NOTE — Telephone Encounter (Signed)
Called patients wife back and she is getting him screened at his PCP and will call back

## 2022-11-03 NOTE — Telephone Encounter (Signed)
Patients wife said patient has been having increasingly worse Hallucinations even though he is taking his Nuplazid 1 34 mg tablet daily. This started two weeks ago and has gotten really bad over the last two days. I did speak with her about UTI respiratory infection or dehydration. She doesn't think this is an issue but she is afraid to leave him alone he has wondered off.

## 2022-11-06 ENCOUNTER — Encounter: Payer: Self-pay | Admitting: Internal Medicine

## 2022-11-06 ENCOUNTER — Ambulatory Visit: Payer: 59 | Admitting: Internal Medicine

## 2022-11-06 VITALS — BP 124/84 | HR 78 | Temp 97.7°F | Ht 71.0 in | Wt 240.0 lb

## 2022-11-06 DIAGNOSIS — F29 Unspecified psychosis not due to a substance or known physiological condition: Secondary | ICD-10-CM | POA: Diagnosis not present

## 2022-11-06 LAB — POC URINALSYSI DIPSTICK (AUTOMATED)
Bilirubin, UA: NEGATIVE
Blood, UA: NEGATIVE
Glucose, UA: NEGATIVE
Ketones, UA: NEGATIVE
Leukocytes, UA: NEGATIVE
Nitrite, UA: NEGATIVE
Protein, UA: NEGATIVE
Spec Grav, UA: 1.025 (ref 1.010–1.025)
Urobilinogen, UA: 0.2 E.U./dL
pH, UA: 6 (ref 5.0–8.0)

## 2022-11-06 NOTE — Addendum Note (Signed)
Addended by: Eual Fines on: 11/06/2022 03:20 PM   Modules accepted: Orders

## 2022-11-06 NOTE — Assessment & Plan Note (Addendum)
Worsened hallucinations and losing the ability to recognize them Some paranoia--thinks wife brought people into house Now with auditory component---but no clear instructions, etc  Urinalysis negative Nothing to suggest infection Will check labs to rule out metabolic component--but it seems to be a worsening of his Parkinson's psychosis Will send note to neurology  Discussed 911 for any serious issues--involving safety of either of them Wife has taken car keys and locked up gun safe, etc

## 2022-11-06 NOTE — Progress Notes (Signed)
Subjective:    Patient ID: [name] [name], male    DOB: 1961-01-24, 62 y.o.   MRN: [id number]  HPI Here with wife due to worsening hallucinations---to make sure no new cause like infection  Hallucinations have been on and off for a long time--but now they are constant and pervasive Forgets they own the house or where he is Sees people in and out of the house---sitting next to him Wife has taken away his keys to car. No access to gun safe  He lost dog--thought he ran away (and then wife found the dog sitting in the truck) Wife is getting cameras for house and putting alert to track his phone  He confirms "terrible hallucinations" Now hard for him to convince himself ("almost impossible") for him to tell it is an hallucination  Doesn't feel sick No fever No cough or SOB No dysuria or hematuria----voids a lot (nothing new)  Current Outpatient Medications on File Prior to Visit  Medication Sig Dispense Refill   aspirin EC 81 MG tablet Take 81 mg by mouth daily.     carbidopa-levodopa (SINEMET CR) 50-200 MG tablet TAKE ONE TABLET BY MOUTH EVERY NIGHT AT BEDTIME 90 tablet 0   carbidopa-levodopa (SINEMET IR) 25-100 MG tablet TAKE TWO TABLETS BY MOUTH AT THE FOLLOWING TIMES. 6AM/9:30AM/1PM/4:30PM. TAKE AN ADDITIONAL ONE HALF (1/2) TABLET AS NEEDED. 765 tablet 0   Levodopa (INBRIJA) 42 MG CAPS Samples of this drug were given to the patient, quantity 1, Lot Number [id number] Exp date 04/29/2023 60 capsule 0   Levodopa (INBRIJA) 42 MG CAPS You can inhale the capsules as needed up to 5 times per day, separated by 2 hour intervals.  Many patients use this right when the wake up to help with first morning "on" and then as needed during the day. 300 capsule 1   losartan-hydrochlorothiazide (HYZAAR) 100-25 MG tablet TAKE ONE TABLET BY MOUTH ONCE A DAY 90 tablet 0   Pimavanserin Tartrate (NUPLAZID) 34 MG CAPS TAKE 1 CAPSULE BY MOUTH DAILY 90 capsule 0   vitamin B-12 (CYANOCOBALAMIN) 1000 MCG  tablet Take 1,000 mcg by mouth daily.     [DISCONTINUED] potassium chloride (K-DUR) 10 MEQ tablet Take 1 tablet (10 mEq total) by mouth 2 (two) times daily. 60 tablet 2   No current facility-administered medications on file prior to visit.    Allergies  Allergen Reactions   Hydrocodone Nausea Only    Past Medical History:  Diagnosis Date   Alcohol abuse 09/08/2019   Episodes of formed visual hallucinations    Essential hypertension, benign 01/31/2007   Herniated disc, cervical    History of skin cancer    basal cell; face and abdomen   Hyperlipidemia    Mild neurocognitive disorder with Lewy bodies, possible 05/03/2021   Parkinson's disease 04/27/2016   REM sleep behaviors    Syringomyelia 09/08/2019   Tremor of right hand 06/21/2015    Past Surgical History:  Procedure Laterality Date   BASAL CELL CARCINOMA EXCISION     EYE SURGERY     as a child   FINGER FRACTURE SURGERY     5th digit, left hand, x2   HERNIA REPAIR     age 18    Family History  Problem Relation Age of Onset   Hypertension Mother    Diabetes Father    Hypertension Father    Heart attack Father    Parkinson's disease Maternal Grandmother    Parkinsonism Maternal Grandmother     Social  History   Socioeconomic History   Marital status: Married    Spouse name: Not on file   Number of children: 1   Years of education: 12   Highest education level: High school graduate  Occupational History   Occupation: Project manager--now disabled    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  Cardiovascular:     Rate and Rhythm: Normal rate and regular rhythm.     Heart sounds: No murmur heard.    No gallop.  Pulmonary:     Effort: Pulmonary effort is normal.     Breath sounds: Normal breath sounds. No wheezing or rales.  Abdominal:     Palpations: Abdomen is soft.     Tenderness: There is no abdominal tenderness.  Musculoskeletal:     Cervical back: Neck supple.     Right lower leg: No edema.     Left lower leg: No edema.  Lymphadenopathy:     Cervical: No cervical adenopathy.  Neurological:     Mental Status: He is alert.  Psychiatric:        Mood and Affect: Mood normal.            Assessment & Plan:

## 2022-11-07 LAB — CBC
HCT: 43.6 % (ref 39.0–52.0)
Hemoglobin: 14.8 g/dL (ref 13.0–17.0)
MCHC: 34 g/dL (ref 30.0–36.0)
MCV: 95.9 fl (ref 78.0–100.0)
Platelets: 188 10*3/uL (ref 150.0–400.0)
RBC: 4.55 Mil/uL (ref 4.22–5.81)
RDW: 12.7 % (ref 11.5–15.5)
WBC: 7 10*3/uL (ref 4.0–10.5)

## 2022-11-07 LAB — COMPREHENSIVE METABOLIC PANEL
ALT: 7 U/L (ref 0–53)
AST: 18 U/L (ref 0–37)
Albumin: 4.5 g/dL (ref 3.5–5.2)
Alkaline Phosphatase: 28 U/L — ABNORMAL LOW (ref 39–117)
BUN: 18 mg/dL (ref 6–23)
CO2: 27 mEq/L (ref 19–32)
Calcium: 9.3 mg/dL (ref 8.4–10.5)
Chloride: 100 mEq/L (ref 96–112)
Creatinine, Ser: 0.97 mg/dL (ref 0.40–1.50)
GFR: 82.95 mL/min (ref >60.00)
Glucose, Bld: 105 mg/dL — ABNORMAL HIGH (ref 70–99)
Potassium: 3.9 mEq/L (ref 3.5–5.1)
Sodium: 136 mEq/L (ref 135–145)
Total Bilirubin: 1 mg/dL (ref 0.2–1.2)
Total Protein: 7 g/dL (ref 6.0–8.3)

## 2022-11-07 LAB — TSH: TSH: 0.87 u[IU]/mL (ref 0.35–5.50)

## 2022-11-09 ENCOUNTER — Telehealth: Payer: Self-pay | Admitting: Neurology

## 2022-11-09 NOTE — Telephone Encounter (Signed)
He is on the wait list.

## 2022-11-09 NOTE — Telephone Encounter (Signed)
Pt called in stating he has been having hallucinations. He recently was seeing Dr. Alphonsus Sias and he did some tests to see if he could find out why they were happening, but the tests didn't show anything. Dr. Alphonsus Sias suggested him ask Dr. Arbutus Leas.

## 2022-11-09 NOTE — Telephone Encounter (Signed)
Called pateint and left message he is on CX list and that Dr. Arbutus Leas is aware of the hallucinations ans issues patient is having

## 2022-11-10 NOTE — Progress Notes (Unsigned)
Assessment/Plan:   1.  Parkinsons Disease             -continue carbidopa/levodopa 25/100, 2 tablets at 6 AM/9:30 AM/1 PM/4:30 PM.   He can take half tablet extra if needed on days that tremor is more bothersome.  -Continue carbidopa/levodopa 50/200 CR about 30 minutes before bedtime  -Continue Inbrija as needed.  He only uses it about 1 time per week, since he is no longer working.    -Patient has applied for disability.  I agree with this and agree that he should not be working.    2.  Hallucinations             -On Nuplazid.    -Discussed potentially adding quetiapine onto Nuplazid.  Discussed that these are both antipsychotics and the risks of increased in using them together, especially for increased QT.  Discussed what this meant.  -we will do EKG to assess QT.  Once that is completed, he wants to start the quetiapine.  He was given a schedule to do this and will work to quetiapine, 25 mg, half tablet in the morning and 1 tablet at bedtime.  Again, we extensively discussed risks, benefits, side effects, including black box warning and also the potentiation with Nuplazid.  Both patient and wife expressed understanding.  -These are increasing and likely due to Parkinson's disease, and also alcohol.  Nonetheless, I would like to get an MRI of the brain.  He declines and we will do CT brain instead.     3.  Alcohol overuse             -long discussion about this.  He is drinking at least a fifth of alcohol/week.  I am going to have his pcp help me wean this down as I bet this is promoting hallucinations.  I also had a long discussion with the patient's wife.  He is not driving, so she is buying alcohol.  Discussed that we would not want to stop this cold [place] and I am going to have his primary care help me wean this.   4.  B12 deficiency             -Recommend oral B12 supplementation.   5.  Cervical stenosis with syringomyelia             -Really would like to repeat this MRI of the  cervical spine.  He and I talked about that for a long time.  He declined.   6.  Cognitive impairment  -Dr. Milbert Coulter did memory testing in December, 2022.  While that only revealed MCI, he was concerned with dementia with Lewy bodies.  While I really do not think he meets criteria for that as he did not have profound memory impairment early in the disease, and really has not had memory interfere with social and occupational or ADLs (requirements for LBD), I do have concerns that memory impairment is profound enough to not be a candidate for DBS.  7.  Sialorrhea  -Was fairly significant in the office today.  Botox offered.  Patient does not want that right now.  States that it is not that bothersome to him  8.  Low back pain with possible lumbar radiculopathy  -He asked about pain medicine, but I do not want to do that without knowing what we are treating.  In addition, most pain medications will promote hallucinations.  -Discussed physical therapy and sports medicine referral.  He would need home  PT because he doesn't drive.  He will think about it.  He does not want either right now.  -Discussed MRI lumbar spine but he declined.    Subjective:   [name] [name] was seen today.  My previous records were reviewed prior to todays visit as well as outside records available to me.  Patient worked in today at request of primary care.  Patient did call me recently hip pain, but I told him that he would need to follow-up with primary care.  The patient did that and saw primary care 2 days ago who felt that it was not hip pain, but rather radicular pain on the left.  Primary care noted that patient also was complaining about shoulder pain/involvement and was concerned about cervical disease, but patient did not want to proceed with MRI.  Patient was referred here for further evaluation.  We worked him in 2 days after his primary care appointment (Oct 19, 2022).  But unfortunately, he no showed that appointment.   It turns out that pts brother in law died.  He has since been back to primary care for back pain and was given prednisone. He is still having pain but it is better than is better.   He went back to primary care a few weeks after that and was complaining about increasing hallucinations.  Hallucinations got worse with the prednisone but didn't get better when he went off of it.   Lab work was done and was fairly unremarkable.  He has stopped working end of April.  He is not driving.  He is drinking "right much alcohol."  He drinks at least a fifth of vodka per week.  He has been doing this at least 5 years.    Separate from the above, I gave the patient samples of Inbrija last visit.  He reports today that he doesn't use it often - maybe once a week.  He would use it more often if working.  Current prescribed movement disorder medications: Carbidopa/levodopa 25/100, 2 tablets at 6 AM/9:30 AM/1 PM/4:30 PM (he is taking 2.5 tablets at the 1pm dose) Carbidopa/levodopa 50/200 CR at bedtime (added last visit) Nuplazid   PREVIOUS MEDICATIONS: pramipexole (increased hallucinations)  ALLERGIES:   Allergies  Allergen Reactions   Hydrocodone Nausea Only    CURRENT MEDICATIONS:  Outpatient Encounter Medications as of 11/13/2022  Medication Sig   aspirin EC 81 MG tablet Take 81 mg by mouth daily.   carbidopa-levodopa (SINEMET CR) 50-200 MG tablet TAKE ONE TABLET BY MOUTH EVERY NIGHT AT BEDTIME   carbidopa-levodopa (SINEMET IR) 25-100 MG tablet TAKE TWO TABLETS BY MOUTH AT THE FOLLOWING TIMES. 6AM/9:30AM/1PM/4:30PM. TAKE AN ADDITIONAL ONE HALF (1/2) TABLET AS NEEDED.   Levodopa (INBRIJA) 42 MG CAPS You can inhale the capsules as needed up to 5 times per day, separated by 2 hour intervals.  Many patients use this right when the wake up to help with first morning "on" and then as needed during the day.   losartan-hydrochlorothiazide (HYZAAR) 100-25 MG tablet TAKE ONE TABLET BY MOUTH ONCE A DAY   Pimavanserin  Tartrate (NUPLAZID) 34 MG CAPS TAKE 1 CAPSULE BY MOUTH DAILY   vitamin B-12 (CYANOCOBALAMIN) 1000 MCG tablet Take 1,000 mcg by mouth daily.   Levodopa (INBRIJA) 42 MG CAPS Samples of this drug were given to the patient, quantity 1, Lot Number [id number] Exp date 04/29/2023 (Patient not taking: Reported on 11/13/2022)   [DISCONTINUED] potassium chloride (K-DUR) 10 MEQ tablet Take 1 tablet (10  mEq total) by mouth 2 (two) times daily.   No facility-administered encounter medications on file as of 11/13/2022.    Objective:   PHYSICAL EXAMINATION:    VITALS:   Vitals:   11/13/22 0827  BP: (!) 142/88  Pulse: 61  SpO2: 96%  Weight: 245 lb 9.6 oz (111.4 kg)  Height: 5\' 11"  (1.803 m)     GEN:  The patient appears stated age and is in NAD. HEENT:  Normocephalic, atraumatic.  Drooling is noted significantly (similar to prior).  The mucous membranes are moist. The superficial temporal arteries are without ropiness or tenderness. Cardiovascular: Regular rate rhythm Lungs: Clear to auscultation bilaterally Neck: No bruits  Neurological examination:  Orientation: The patient is alert and oriented x3.  He is slow to answer questions, but answers are appropriate Cranial nerves: There is good facial symmetry with significant facial hypomimia. The speech is fluent and clear.  He is hypophonic.  Soft palate rises symmetrically and there is no tongue deviation. Hearing is intact to conversational tone. Sensation: Sensation is intact to light touch throughout Motor: Strength is at least antigravity x4.  Movement examination: Tone: He has mild increased tone on the R.  This is stable from last visit. Abnormal movements: He has no tremor today Coordination:  There is mild decremation on the right hand with finger taps and L foot with toe taps   I have reviewed and interpreted the following labs independently    Chemistry      Component Value Date/Time   NA 136 11/06/2022 1522   K 3.9 11/06/2022  1522   CL 100 11/06/2022 1522   CO2 27 11/06/2022 1522   BUN 18 11/06/2022 1522   CREATININE 0.97 11/06/2022 1522   CREATININE 0.91 08/14/2019 1534      Component Value Date/Time   CALCIUM 9.3 11/06/2022 1522   ALKPHOS 28 (L) 11/06/2022 1522   AST 18 11/06/2022 1522   ALT 7 11/06/2022 1522   BILITOT 1.0 11/06/2022 1522       Lab Results  Component Value Date   WBC 7.0 11/06/2022   HGB 14.8 11/06/2022   HCT 43.6 11/06/2022   MCV 95.9 11/06/2022   PLT 188.0 11/06/2022    Lab Results  Component Value Date   TSH 0.87 11/06/2022     Total time spent on today's visit was 41 minutes, including both face-to-face time and nonface-to-face time.  Time included that spent on review of records (prior notes available to me/labs/imaging if pertinent), discussing treatment and goals, answering patient's questions and coordinating care.  Cc:  Karie Schwalbe, MD

## 2022-11-13 ENCOUNTER — Ambulatory Visit (INDEPENDENT_AMBULATORY_CARE_PROVIDER_SITE_OTHER): Payer: 59 | Admitting: Neurology

## 2022-11-13 ENCOUNTER — Ambulatory Visit: Payer: 59 | Admitting: Cardiology

## 2022-11-13 ENCOUNTER — Other Ambulatory Visit: Payer: Self-pay

## 2022-11-13 ENCOUNTER — Encounter: Payer: Self-pay | Admitting: Neurology

## 2022-11-13 VITALS — BP 142/88 | HR 61 | Ht 71.0 in | Wt 245.6 lb

## 2022-11-13 DIAGNOSIS — R441 Visual hallucinations: Secondary | ICD-10-CM

## 2022-11-13 DIAGNOSIS — G20A2 Parkinson's disease without dyskinesia, with fluctuations: Secondary | ICD-10-CM | POA: Diagnosis not present

## 2022-11-13 DIAGNOSIS — F1099 Alcohol use, unspecified with unspecified alcohol-induced disorder: Secondary | ICD-10-CM | POA: Diagnosis not present

## 2022-11-13 DIAGNOSIS — Z5181 Encounter for therapeutic drug level monitoring: Secondary | ICD-10-CM

## 2022-11-13 DIAGNOSIS — R443 Hallucinations, unspecified: Secondary | ICD-10-CM

## 2022-11-13 DIAGNOSIS — R079 Chest pain, unspecified: Secondary | ICD-10-CM

## 2022-11-13 MED ORDER — NUPLAZID 34 MG PO CAPS
1.0000 | ORAL_CAPSULE | Freq: Every day | ORAL | 1 refills | Status: DC
Start: 2022-11-13 — End: 2023-05-01

## 2022-11-13 NOTE — Patient Instructions (Addendum)
Start seroquel (quetiapine) 25 mg at bedtime for 2 weeks and then start 1/2 tablet in the morning and continue the 1 at bedtime thereafter.  I will send this to the pharmacy once you get your EKG done.   [address] [place], [place] [address] Hours:  Open ? Closes 5?PM Phone: [phone] Products and Services: [email or web address]

## 2022-11-13 NOTE — Progress Notes (Signed)
ICD-10-CM   1. Chest pain of uncertain etiology  R07.9 EKG 12-Lead     EKG 11/13/2022: Normal sinus rhythm with rate of 59 bpm, poor R progression, probably normal variant.  EKG within normal limits.  Compared to 05/06/2012, single PVC not present otherwise no change.

## 2022-11-20 ENCOUNTER — Telehealth: Payer: Self-pay | Admitting: Neurology

## 2022-11-20 ENCOUNTER — Other Ambulatory Visit: Payer: Self-pay

## 2022-11-20 DIAGNOSIS — R443 Hallucinations, unspecified: Secondary | ICD-10-CM

## 2022-11-20 MED ORDER — QUETIAPINE FUMARATE 25 MG PO TABS
ORAL_TABLET | ORAL | 0 refills | Status: DC
Start: 2022-11-20 — End: 2023-03-16

## 2022-11-20 NOTE — Telephone Encounter (Signed)
Called patient prescription has been sent

## 2022-11-20 NOTE — Telephone Encounter (Signed)
Pt states that he was to start taking the Seroquel and would like that called into the [facility]  pharmacy

## 2022-12-11 ENCOUNTER — Ambulatory Visit
Admission: RE | Admit: 2022-12-11 | Discharge: 2022-12-11 | Disposition: A | Discharge status: Home / Self Care | Payer: 59 | Source: Ambulatory Visit | Attending: Neurology | Admitting: Neurology

## 2022-12-11 DIAGNOSIS — R443 Hallucinations, unspecified: Secondary | ICD-10-CM

## 2022-12-18 ENCOUNTER — Telehealth: Payer: Self-pay | Admitting: Neurology

## 2022-12-18 NOTE — Telephone Encounter (Signed)
Called and left message that the results were not back at this time.

## 2022-12-18 NOTE — Telephone Encounter (Signed)
Pt said he had a CAT scan done last week and would like results

## 2022-12-19 ENCOUNTER — Ambulatory Visit: Payer: 59 | Admitting: Neurology

## 2022-12-20 NOTE — Telephone Encounter (Signed)
Patient is scheduled   

## 2022-12-20 NOTE — Telephone Encounter (Signed)
Do I need to call reading room for these results I don't see them in the system

## 2022-12-20 NOTE — Telephone Encounter (Signed)
Called patient to verify he received results. He doesn't have an appointment scheduled is there a certain time you would like him to come back

## 2022-12-20 NOTE — Telephone Encounter (Signed)
Patient called yesterday and left a message. He would like to know if results are in

## 2022-12-27 ENCOUNTER — Encounter (INDEPENDENT_AMBULATORY_CARE_PROVIDER_SITE_OTHER): Payer: Self-pay

## 2023-01-02 ENCOUNTER — Other Ambulatory Visit: Payer: Self-pay | Admitting: Internal Medicine

## 2023-01-02 ENCOUNTER — Other Ambulatory Visit: Payer: Self-pay | Admitting: Neurology

## 2023-01-02 DIAGNOSIS — G20A1 Parkinson's disease without dyskinesia, without mention of fluctuations: Secondary | ICD-10-CM

## 2023-01-11 ENCOUNTER — Encounter (INDEPENDENT_AMBULATORY_CARE_PROVIDER_SITE_OTHER): Payer: Self-pay

## 2023-01-17 ENCOUNTER — Other Ambulatory Visit: Payer: Self-pay | Admitting: Neurology

## 2023-01-17 ENCOUNTER — Telehealth: Payer: Self-pay | Admitting: Neurology

## 2023-01-17 DIAGNOSIS — G20A2 Parkinson's disease without dyskinesia, with fluctuations: Secondary | ICD-10-CM

## 2023-01-17 NOTE — Telephone Encounter (Signed)
Pt called left a message stating that he is needing a refill on Rx carbidopa-levodopa (SINEMET CR) 50-200 MG.  [facility]

## 2023-01-17 NOTE — Telephone Encounter (Signed)
RX sent

## 2023-01-18 ENCOUNTER — Encounter: Payer: 59 | Admitting: Internal Medicine

## 2023-02-08 ENCOUNTER — Encounter: Payer: Self-pay | Admitting: Internal Medicine

## 2023-02-08 ENCOUNTER — Ambulatory Visit (INDEPENDENT_AMBULATORY_CARE_PROVIDER_SITE_OTHER): Payer: 59 | Admitting: Internal Medicine

## 2023-02-08 VITALS — BP 112/70 | HR 84 | Temp 98.4°F

## 2023-02-08 DIAGNOSIS — R051 Acute cough: Secondary | ICD-10-CM

## 2023-02-08 DIAGNOSIS — U071 COVID-19: Secondary | ICD-10-CM | POA: Insufficient documentation

## 2023-02-08 LAB — POC COVID19 BINAXNOW: SARS Coronavirus 2 Ag: POSITIVE — AB

## 2023-02-08 MED ORDER — PAXLOVID (300/100) 20 X 150 MG & 10 X 100MG PO TBPK: 3.0000 | ORAL_TABLET | Freq: Two times a day (BID) | ORAL | 0 refills | Status: DC

## 2023-02-08 NOTE — Assessment & Plan Note (Signed)
Moderate systemic symptoms on day 1 with increased risk due to his Parknison's etc Discussed regular tylenol OTC cough meds Will Rx paxlovid--cut pimavanserin in half and hold seroquel while on ER if SOB or worsens Isolate till early next week and then out with mask when feels better

## 2023-02-08 NOTE — Progress Notes (Signed)
Subjective:    Patient ID: [name] [name], male    DOB: 1960-08-19, 62 y.o.   MRN: [id number]  HPI Was here for physical--but is sick after exposed to COVID in wife and now is positive here With wife Will defer the physical  Wife is just improving and he got sick this morning Cough, "feel like crap" Very lethargic No clear fever but has chills and shakes Some myalgias Not much headache Lots of cough--some mucus Some SOB---even just sitting No ear pain Some sore throat  Current Outpatient Medications on File Prior to Visit  Medication Sig Dispense Refill   aspirin EC 81 MG tablet Take 81 mg by mouth daily.     carbidopa-levodopa (SINEMET CR) 50-200 MG tablet TAKE ONE TABLET BY MOUTH EVERY NIGHT AT BEDTIME 90 tablet 0   carbidopa-levodopa (SINEMET IR) 25-100 MG tablet TAKE TWO TABLETS BY MOUTH AT THE FOLLOWING TIMES. 6AM/9:30AM/1PM/4:30PM. TAKE AN ADDITIONAL ONE HALF (1/2) TABLET AS NEEDED. 765 tablet 0   Levodopa (INBRIJA) 42 MG CAPS Samples of this drug were given to the patient, quantity 1, Lot Number [id number] Exp date 04/29/2023 60 capsule 0   Levodopa (INBRIJA) 42 MG CAPS You can inhale the capsules as needed up to 5 times per day, separated by 2 hour intervals.  Many patients use this right when the wake up to help with first morning "on" and then as needed during the day. 300 capsule 1   losartan-hydrochlorothiazide (HYZAAR) 100-25 MG tablet TAKE ONE TABLET BY MOUTH ONCE A DAY 90 tablet 0   Pimavanserin Tartrate (NUPLAZID) 34 MG CAPS Take 1 capsule (34 mg total) by mouth daily. 90 capsule 1   QUEtiapine (SEROQUEL) 25 MG tablet Take 1/2 tablet In the morning and Take 1 tablet at bedtime 180 tablet 0   vitamin B-12 (CYANOCOBALAMIN) 1000 MCG tablet Take 1,000 mcg by mouth daily.     [DISCONTINUED] potassium chloride (K-DUR) 10 MEQ tablet Take 1 tablet (10 mEq total) by mouth 2 (two) times daily. 60 tablet 2   No current facility-administered medications on file prior to  visit.    Allergies  Allergen Reactions   Hydrocodone Nausea Only    Past Medical History:  Diagnosis Date   Alcohol abuse 09/08/2019   Episodes of formed visual hallucinations    Essential hypertension, benign 01/31/2007   Herniated disc, cervical    History of skin cancer    basal cell; face and abdomen   Hyperlipidemia    Mild neurocognitive disorder with Lewy bodies, possible 05/03/2021   Parkinson's disease 04/27/2016   REM sleep behaviors    Syringomyelia 09/08/2019   Tremor of right hand 06/21/2015    Past Surgical History:  Procedure Laterality Date   BASAL CELL CARCINOMA EXCISION     EYE SURGERY     as a child   FINGER FRACTURE SURGERY     5th digit, left hand, x2   HERNIA REPAIR     age 66    Family History  Problem Relation Age of Onset   Hypertension Mother    Diabetes Father    Hypertension Father    Heart attack Father    Parkinson's disease Maternal Grandmother    Parkinsonism Maternal Grandmother     Social History   Socioeconomic History   Marital status: Married    Spouse name: Not on file   Number of children: 1   Years of education: 12   Highest education level: High school graduate  Occupational History  Occupation: Research officer, trade union disabled    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     Left Ear: Tympanic membrane and ear canal normal.     Mouth/Throat:     Comments: Mild pharyngeal injection Pulmonary:     Effort: Pulmonary effort is normal.     Breath sounds: Normal breath sounds. No wheezing or rales.  Musculoskeletal:     Cervical back: Neck supple.  Lymphadenopathy:     Cervical: No cervical adenopathy.  Neurological:     Mental Status: He is alert.            Assessment & Plan:

## 2023-02-09 ENCOUNTER — Telehealth: Payer: Self-pay | Admitting: Neurology

## 2023-02-09 NOTE — Telephone Encounter (Signed)
Called patients wife and they are going to stop the nuplazid and seroquel she feels it is not really working for this patient . He would like an appointment to discuss other options

## 2023-02-09 NOTE — Telephone Encounter (Signed)
Caller states her husband has COVID. His PCP told him to stop taking his parkinsons meds while taking Paxlovid. Wife is concerned due to his hallucinations when he is not on his meds. Seroquel and nuplazid

## 2023-03-02 ENCOUNTER — Other Ambulatory Visit: Payer: Self-pay | Admitting: Internal Medicine

## 2023-03-02 DIAGNOSIS — Z1211 Encounter for screening for malignant neoplasm of colon: Secondary | ICD-10-CM

## 2023-03-02 DIAGNOSIS — Z1212 Encounter for screening for malignant neoplasm of rectum: Secondary | ICD-10-CM

## 2023-03-12 ENCOUNTER — Telehealth: Payer: Self-pay | Admitting: Neurology

## 2023-03-12 NOTE — Telephone Encounter (Signed)
Caller stated husband is having episodes with his parkinson's, having bad hallucinations, and asking for the combination to gun safe to shoot someone that he is seeing in the house. Would like to speak with nurse as soon as possible.

## 2023-03-12 NOTE — Telephone Encounter (Signed)
Pt is sch for 03-21-23 at 9:15 and knows to be here at 9:00

## 2023-03-12 NOTE — Telephone Encounter (Signed)
Patient is taken one tablet of the seroquel. She will increase the serouqel to bid. Thanked [place] for the quick call and response.She will call back if any other concerns. I advised of time.

## 2023-03-15 NOTE — Progress Notes (Deleted)
Assessment/Plan:   1.  Parkinsons Disease  -stop carbidopa/levodopa 25/100, 2 tablets qid   Stop carbidopa/levodopa 50/200 at bed  -start rytary 245 mg, 1 capsule at 6am/9:30 AM/1 PM/4:30 PM/30 min before bed             -continue carbidopa/levodopa 25/100, 2 tablets at 6 AM/9:30 AM/1 PM/4:30 PM.   He can take half tablet extra if needed on days that tremor is more bothersome.  -Continue carbidopa/levodopa 50/200 CR about 30 minutes before bedtime  -Continue Inbrija as needed.  He only uses it about 1 time per week, since he is no longer working.    -Patient has applied for disability.  I agree with this and agree that he should not be working.    2.  Hallucinations             -On Nuplazid.    -Discussed risks of using nuplazid and quietiapine together.  Discussed that these are both antipsychotics and the risks of increased in using them together, especially for increased QT.  Discussed what this meant.  They want to continue.  -QT/QTc normal in 10/2022  -These are increasing and likely due to Parkinson's disease, and also alcohol.  Nonetheless, I would like to get an MRI of the brain.  He declines and we will do CT brain instead.     3.  Alcohol overuse             -long discussion about this.  I continue to think that alcohol is a problem in inciting hallucinations.  He is drinking at least a fifth of alcohol/week.  I am going to have his pcp help me wean this down as I bet this is promoting hallucinations.  I also had a long discussion with the patient's wife.  He is not driving, so she is buying alcohol.  Discussed that we would not want to stop this cold [place] and I am going to have his primary care help me wean this.   4.  B12 deficiency             -Recommend oral B12 supplementation.   5.  Cervical stenosis with syringomyelia             -Really would like to repeat this MRI of the cervical spine.  He and I talked about that for a long time.  He declined.   6.  Cognitive  impairment  -Dr. Milbert Coulter did memory testing in December, 2022.  While that only revealed MCI, he was concerned with dementia with Lewy bodies.  While I really do not think he meets criteria for that as he did not have profound memory impairment early in the disease, and really has not had memory interfere with social and occupational or ADLs (requirements for LBD), I do have concerns that memory impairment is profound enough to not be a candidate for DBS.  7.  Sialorrhea  -Was fairly significant in the office today.  Botox offered.  Patient does not want that right now.  States that it is not that bothersome to him  8.  Low back pain with possible lumbar radiculopathy  -He asked about pain medicine, but I do not want to do that without knowing what we are treating.  In addition, most pain medications will promote hallucinations.  -Discussed physical therapy and sports medicine referral.  He would need home PT because he doesn't drive.  He will think about it.  He does not want either  right now.  -Discussed MRI lumbar spine but he declined.    Subjective:   [name] [name] was seen today.  My previous records were reviewed prior to todays visit as well as outside records available to me.  Patient worked in today.  His wife had called me regarding increasing hallucinations.  This was only 3 days ago.  Last visit, we had a long discussion about decreasing and ultimately discontinuing alcohol.  This has been a long-term discussion  Current prescribed movement disorder medications: Carbidopa/levodopa 25/100, 2 tablets at 6 AM/9:30 AM/1 PM/4:30 PM (he is taking 2.5 tablets at the 1pm dose) Carbidopa/levodopa 50/200 CR at bedtime (added last visit) Nuplazid Seroquel, 25 mg, 1/2 in the AM, 1 at bed  PREVIOUS MEDICATIONS: pramipexole (increased hallucinations); Inbrija; quetiapine; Nuplazid  ALLERGIES:   Allergies  Allergen Reactions   Hydrocodone Nausea Only    CURRENT MEDICATIONS:  Outpatient  Encounter Medications as of 03/19/2023  Medication Sig   aspirin EC 81 MG tablet Take 81 mg by mouth daily.   carbidopa-levodopa (SINEMET CR) 50-200 MG tablet TAKE ONE TABLET BY MOUTH EVERY NIGHT AT BEDTIME   carbidopa-levodopa (SINEMET IR) 25-100 MG tablet TAKE TWO TABLETS BY MOUTH AT THE FOLLOWING TIMES. 6AM/9:30AM/1PM/4:30PM. TAKE AN ADDITIONAL ONE HALF (1/2) TABLET AS NEEDED.   Levodopa (INBRIJA) 42 MG CAPS Samples of this drug were given to the patient, quantity 1, Lot Number [id number] Exp date 04/29/2023   Levodopa (INBRIJA) 42 MG CAPS You can inhale the capsules as needed up to 5 times per day, separated by 2 hour intervals.  Many patients use this right when the wake up to help with first morning "on" and then as needed during the day.   losartan-hydrochlorothiazide (HYZAAR) 100-25 MG tablet TAKE ONE TABLET BY MOUTH ONCE A DAY   nirmatrelvir & ritonavir (PAXLOVID, 300/100,) 20 x 150 MG & 10 x 100MG  TBPK Take 3 tablets by mouth 2 (two) times daily. Don't take quetiapine and cut the pimavanserin in half while on this   Pimavanserin Tartrate (NUPLAZID) 34 MG CAPS Take 1 capsule (34 mg total) by mouth daily.   QUEtiapine (SEROQUEL) 25 MG tablet Take 1/2 tablet In the morning and Take 1 tablet at bedtime   vitamin B-12 (CYANOCOBALAMIN) 1000 MCG tablet Take 1,000 mcg by mouth daily.   [DISCONTINUED] potassium chloride (K-DUR) 10 MEQ tablet Take 1 tablet (10 mEq total) by mouth 2 (two) times daily.   No facility-administered encounter medications on file as of 03/19/2023.    Objective:   PHYSICAL EXAMINATION:    VITALS:   There were no vitals filed for this visit.    GEN:  The patient appears stated age and is in NAD. HEENT:  Normocephalic, atraumatic.  Drooling is noted significantly (similar to prior).  The mucous membranes are moist. The superficial temporal arteries are without ropiness or tenderness. Cardiovascular: Regular rate rhythm Lungs: Clear to auscultation  bilaterally Neck: No bruits  Neurological examination:  Orientation: The patient is alert and oriented x3.  He is slow to answer questions, but answers are appropriate Cranial nerves: There is good facial symmetry with significant facial hypomimia. The speech is fluent and clear.  He is hypophonic.  Soft palate rises symmetrically and there is no tongue deviation. Hearing is intact to conversational tone. Sensation: Sensation is intact to light touch throughout Motor: Strength is at least antigravity x4.  Movement examination: Tone: He has mild increased tone on the R.  This is stable from last visit. Abnormal movements:  He has no tremor today Coordination:  There is mild decremation on the right hand with finger taps and L foot with toe taps   I have reviewed and interpreted the following labs independently    Chemistry      Component Value Date/Time   NA 136 11/06/2022 1522   K 3.9 11/06/2022 1522   CL 100 11/06/2022 1522   CO2 27 11/06/2022 1522   BUN 18 11/06/2022 1522   CREATININE 0.97 11/06/2022 1522   CREATININE 0.91 08/14/2019 1534      Component Value Date/Time   CALCIUM 9.3 11/06/2022 1522   ALKPHOS 28 (L) 11/06/2022 1522   AST 18 11/06/2022 1522   ALT 7 11/06/2022 1522   BILITOT 1.0 11/06/2022 1522       Lab Results  Component Value Date   WBC 7.0 11/06/2022   HGB 14.8 11/06/2022   HCT 43.6 11/06/2022   MCV 95.9 11/06/2022   PLT 188.0 11/06/2022    Lab Results  Component Value Date   TSH 0.87 11/06/2022     Total time spent on today's visit was *** minutes, including both face-to-face time and nonface-to-face time.  Time included that spent on review of records (prior notes available to me/labs/imaging if pertinent), discussing treatment and goals, answering patient's questions and coordinating care.  Cc:  Karie Schwalbe, MD

## 2023-03-16 ENCOUNTER — Other Ambulatory Visit: Payer: Self-pay | Admitting: Neurology

## 2023-03-16 DIAGNOSIS — R443 Hallucinations, unspecified: Secondary | ICD-10-CM

## 2023-03-19 ENCOUNTER — Encounter: Payer: Self-pay | Admitting: Neurology

## 2023-03-19 ENCOUNTER — Ambulatory Visit: Payer: 59 | Admitting: Neurology

## 2023-03-19 DIAGNOSIS — Z029 Encounter for administrative examinations, unspecified: Secondary | ICD-10-CM

## 2023-03-21 ENCOUNTER — Ambulatory Visit: Payer: 59 | Admitting: Neurology

## 2023-03-21 NOTE — Telephone Encounter (Signed)
Called and spoke to patients wife she did confirm that she didn't know about the appointment. She said she didn't think he was drinking as much but I told her no alcohol. Dr. Arbutus Leas needs to see patient sober. She apologized for the mix up. She would like to see dr. Arbutus Leas as soon as they can

## 2023-03-21 NOTE — Telephone Encounter (Signed)
Pt missed last appt by accident. Pt is having bad hallucinations 24/7. The pt's wife is very worried and concerned.

## 2023-04-05 ENCOUNTER — Telehealth: Payer: Self-pay

## 2023-04-05 MED ORDER — LOSARTAN POTASSIUM-HCTZ 100-25 MG PO TABS: 1.0000 | ORAL_TABLET | Freq: Every day | ORAL | 0 refills | Status: DC

## 2023-04-05 NOTE — Telephone Encounter (Signed)
Sending note to Dr Earma Reading pool.

## 2023-04-05 NOTE — Telephone Encounter (Signed)
[facility] he makes it to this appointment

## 2023-04-05 NOTE — Telephone Encounter (Signed)
Spoke to pt's wife to find out the pharmacy. We have not received a refill request. Went ahead and sent the rx and made him an appt for a CPE next week.

## 2023-04-10 ENCOUNTER — Encounter: Payer: Self-pay | Admitting: Internal Medicine

## 2023-04-10 ENCOUNTER — Ambulatory Visit (INDEPENDENT_AMBULATORY_CARE_PROVIDER_SITE_OTHER): Payer: 59 | Admitting: Internal Medicine

## 2023-04-10 VITALS — BP 130/76 | HR 54 | Temp 98.1°F | Ht 69.5 in | Wt 235.0 lb

## 2023-04-10 DIAGNOSIS — G20A2 Parkinson's disease without dyskinesia, with fluctuations: Secondary | ICD-10-CM

## 2023-04-10 DIAGNOSIS — Z0000 Encounter for general adult medical examination without abnormal findings: Secondary | ICD-10-CM | POA: Diagnosis not present

## 2023-04-10 DIAGNOSIS — Z125 Encounter for screening for malignant neoplasm of prostate: Secondary | ICD-10-CM

## 2023-04-10 DIAGNOSIS — Z23 Encounter for immunization: Secondary | ICD-10-CM

## 2023-04-10 DIAGNOSIS — R443 Hallucinations, unspecified: Secondary | ICD-10-CM

## 2023-04-10 DIAGNOSIS — I10 Essential (primary) hypertension: Secondary | ICD-10-CM

## 2023-04-10 DIAGNOSIS — F29 Unspecified psychosis not due to a substance or known physiological condition: Secondary | ICD-10-CM | POA: Diagnosis not present

## 2023-04-10 DIAGNOSIS — F1090 Alcohol use, unspecified, uncomplicated: Secondary | ICD-10-CM

## 2023-04-10 LAB — CBC
HCT: 45.8 % (ref 39.0–52.0)
Hemoglobin: 15.8 g/dL (ref 13.0–17.0)
MCHC: 34.5 g/dL (ref 30.0–36.0)
MCV: 96.6 fL (ref 78.0–100.0)
Platelets: 164 10*3/uL (ref 150.0–400.0)
RBC: 4.75 Mil/uL (ref 4.22–5.81)
RDW: 13.4 % (ref 11.5–15.5)
WBC: 5.1 10*3/uL (ref 4.0–10.5)

## 2023-04-10 LAB — COMPREHENSIVE METABOLIC PANEL
ALT: 6 U/L (ref 0–53)
AST: 19 U/L (ref 0–37)
Albumin: 4.7 g/dL (ref 3.5–5.2)
Alkaline Phosphatase: 34 U/L — ABNORMAL LOW (ref 39–117)
BUN: 14 mg/dL (ref 6–23)
CO2: 30 meq/L (ref 19–32)
Calcium: 9.7 mg/dL (ref 8.4–10.5)
Chloride: 101 meq/L (ref 96–112)
Creatinine, Ser: 0.96 mg/dL (ref 0.40–1.50)
GFR: 83.74 mL/min (ref >60.00)
Glucose, Bld: 110 mg/dL — ABNORMAL HIGH (ref 70–99)
Potassium: 4.3 meq/L (ref 3.5–5.1)
Sodium: 139 meq/L (ref 135–145)
Total Bilirubin: 0.7 mg/dL (ref 0.2–1.2)
Total Protein: 7.4 g/dL (ref 6.0–8.3)

## 2023-04-10 LAB — PSA: PSA: 0.73 ng/mL (ref 0.10–4.00)

## 2023-04-10 MED ORDER — QUETIAPINE FUMARATE 25 MG PO TABS
25.0000 mg | ORAL_TABLET | Freq: Two times a day (BID) | ORAL | 0 refills | Status: DC
Start: 2023-04-10 — End: 2023-06-21

## 2023-04-10 NOTE — Assessment & Plan Note (Signed)
Fairly severe and disabling Rx per Dr Arbutus Leas

## 2023-04-10 NOTE — Assessment & Plan Note (Signed)
He has gone to non alcohol wine!

## 2023-04-10 NOTE — Progress Notes (Signed)
Subjective:    Patient ID: [name] [name], male    DOB: 04-Feb-1961, 62 y.o.   MRN: [id number]  HPI Here for physical  Hallucinations continue--no better He doesn't remember threatening to shoot people in his house Sees figures but not faces----he feels threatening Did go up to seroquel bid  Has stopped alcohol--drinking non alcoholic wine  Parkinson's is about the same---very disabling No set exercise---does "mess" in yard Wife does the housework, etc Not driving  "Not happy"--but doesn't claim depression No thoughts of suicide Regret--can't bowl/golf, etc Does watch TV/sports  Current Outpatient Medications on File Prior to Visit  Medication Sig Dispense Refill   aspirin EC 81 MG tablet Take 81 mg by mouth daily.     carbidopa-levodopa (SINEMET CR) 50-200 MG tablet TAKE ONE TABLET BY MOUTH EVERY NIGHT AT BEDTIME 90 tablet 0   carbidopa-levodopa (SINEMET IR) 25-100 MG tablet TAKE TWO TABLETS BY MOUTH AT THE FOLLOWING TIMES. 6AM/9:30AM/1PM/4:30PM. TAKE AN ADDITIONAL ONE HALF (1/2) TABLET AS NEEDED. 765 tablet 0   Levodopa (INBRIJA) 42 MG CAPS Samples of this drug were given to the patient, quantity 1, Lot Number [id number] Exp date 04/29/2023 60 capsule 0   Levodopa (INBRIJA) 42 MG CAPS You can inhale the capsules as needed up to 5 times per day, separated by 2 hour intervals.  Many patients use this right when the wake up to help with first morning "on" and then as needed during the day. 300 capsule 1   losartan-hydrochlorothiazide (HYZAAR) 100-25 MG tablet Take 1 tablet by mouth daily. 90 tablet 0   Pimavanserin Tartrate (NUPLAZID) 34 MG CAPS Take 1 capsule (34 mg total) by mouth daily. 90 capsule 1   vitamin B-12 (CYANOCOBALAMIN) 1000 MCG tablet Take 1,000 mcg by mouth daily.     [DISCONTINUED] potassium chloride (K-DUR) 10 MEQ tablet Take 1 tablet (10 mEq total) by mouth 2 (two) times daily. 60 tablet 2   No current facility-administered medications on file prior to visit.     Allergies  Allergen Reactions   Hydrocodone Nausea Only    Past Medical History:  Diagnosis Date   Alcohol abuse 09/08/2019   Episodes of formed visual hallucinations    Essential hypertension, benign 01/31/2007   Herniated disc, cervical    History of skin cancer    basal cell; face and abdomen   Hyperlipidemia    Mild neurocognitive disorder with Lewy bodies, possible 05/03/2021   Parkinson's disease 04/27/2016   REM sleep behaviors    Syringomyelia 09/08/2019   Tremor of right hand 06/21/2015    Past Surgical History:  Procedure Laterality Date   BASAL CELL CARCINOMA EXCISION     EYE SURGERY     as a child   FINGER FRACTURE SURGERY     5th digit, left hand, x2   HERNIA REPAIR     age 60    Family History  Problem Relation Age of Onset   Hypertension Mother    Diabetes Father    Hypertension Father    Heart attack Father    Parkinson's disease Maternal Grandmother    Parkinsonism Maternal Grandmother     Social History   Socioeconomic History   Marital status: Married    Spouse name: Not on file   Number of children: 1   Years of education: 12   Highest education level: High school graduate  Occupational History   Occupation: Project manager--now disabled    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 Cut out alcohol Energy okay--does walk Wears seat belt  HENT:  Negative for dental problem, hearing loss and tinnitus.        Overdue for dentist  Eyes:        Right eye vision is poor---lazy eye as child (amblyopia)  Respiratory:  Negative for cough, chest tightness and shortness of breath.   Cardiovascular:  Positive for palpitations. Negative for chest pain and leg swelling.  Gastrointestinal:  Positive for constipation. Negative for blood in stool.       Uses laxative prn No heartburn  Endocrine: Negative for polydipsia and polyuria.  Genitourinary:  Positive for frequency. Negative for difficulty urinating.       No sexual problems  Musculoskeletal:  Positive for arthralgias and back pain. Negative for joint swelling.       Uses OTC meds if bad--not often Some falls--minor bruising  Skin:  Negative for rash.  Allergic/Immunologic: Negative for environmental allergies and immunocompromised state.  Neurological:  Negative for dizziness, syncope, light-headedness and headaches.  Hematological:  Negative for adenopathy. Does not bruise/bleed easily.  Psychiatric/Behavioral:  Positive for dysphoric mood. Negative for sleep disturbance.        Objective:   Physical Exam Constitutional:      Appearance: Normal appearance.  HENT:     Mouth/Throat:     Pharynx: No oropharyngeal exudate or posterior oropharyngeal erythema.  Eyes:     Conjunctiva/sclera: Conjunctivae normal.     Pupils: Pupils are equal, round, and reactive to light.   Cardiovascular:     Rate and Rhythm: Normal rate and regular rhythm.     Pulses: Normal pulses.     Heart sounds: No murmur heard.    No gallop.  Pulmonary:     Effort: Pulmonary effort is normal.     Breath sounds: Normal breath sounds. No wheezing or rales.  Abdominal:     Palpations: Abdomen is soft.     Tenderness: There is no abdominal tenderness.  Musculoskeletal:     Cervical back: Neck supple.     Right lower leg: No edema.     Left lower leg: No edema.  Lymphadenopathy:     Cervical: No cervical adenopathy.  Skin:    Findings: No lesion or rash.  Neurological:     Mental Status: He is alert and oriented to person, place, and time.     Comments: Mild bradykinesia Mild psychomotor retardation  Psychiatric:        Mood and Affect: Mood normal.        Behavior: Behavior normal.            Assessment & Plan:

## 2023-04-10 NOTE — Assessment & Plan Note (Signed)
Has cologuard at home---urged him to do it Will check PSA Flu vaccine and Td today COVID vaccine at the pharmacy

## 2023-04-10 NOTE — Assessment & Plan Note (Signed)
BP Readings from Last 3 Encounters:  04/10/23 130/76  02/08/23 112/70  11/13/22 (!) 142/88   Controlled with losartan/hydrochlorothiazide 100/25

## 2023-04-10 NOTE — Assessment & Plan Note (Signed)
Hallucinations persist On seroquel 25 bid now and nuplazid Urged him to remove rest of weapons--he states that wife has them secured (removed all but 2)

## 2023-04-10 NOTE — Addendum Note (Signed)
Addended by: Eual Fines on: 04/10/2023 08:16 AM   Modules accepted: Orders

## 2023-04-11 ENCOUNTER — Other Ambulatory Visit: Payer: Self-pay | Admitting: Neurology

## 2023-04-11 DIAGNOSIS — G20A2 Parkinson's disease without dyskinesia, with fluctuations: Secondary | ICD-10-CM

## 2023-04-13 ENCOUNTER — Other Ambulatory Visit: Payer: Self-pay | Admitting: Neurology

## 2023-04-13 DIAGNOSIS — G20A1 Parkinson's disease without dyskinesia, without mention of fluctuations: Secondary | ICD-10-CM

## 2023-05-01 ENCOUNTER — Other Ambulatory Visit: Payer: Self-pay | Admitting: Neurology

## 2023-05-01 DIAGNOSIS — R441 Visual hallucinations: Secondary | ICD-10-CM

## 2023-06-08 ENCOUNTER — Ambulatory Visit: Payer: 59 | Admitting: Neurology

## 2023-06-19 ENCOUNTER — Other Ambulatory Visit (HOSPITAL_COMMUNITY): Payer: Self-pay

## 2023-06-19 ENCOUNTER — Telehealth: Payer: Self-pay | Admitting: Pharmacy Technician

## 2023-06-19 NOTE — Telephone Encounter (Signed)
Pharmacy Patient Advocate Encounter   Received notification from Pt Calls Messages that prior authorization for NUPLAZID 34MG  is required/requested.   Insurance verification completed.   The patient is insured through [facility] .   Per test claim: PA required; PA submitted to above mentioned insurance via CoverMyMeds Key/confirmation #/EOC [id number] Status is pending

## 2023-06-20 NOTE — Progress Notes (Unsigned)
Assessment/Plan:   1.  Parkinsons Disease  -stop carbidopa/levodopa 25/100, 2 tablets qid   Stop carbidopa/levodopa 50/200 at bed  -start rytary 245 mg, 1 capsule at 6am/9:30 AM/1 PM/4:30 PM/30 min before bed             -continue carbidopa/levodopa 25/100, 2 tablets at 6 AM/9:30 AM/1 PM/4:30 PM.   He can take half tablet extra if needed on days that tremor is more bothersome.  -Continue carbidopa/levodopa 50/200 CR about 30 minutes before bedtime  -Continue Inbrija as needed.  He only uses it about 1 time per week, since he is no longer working.    -Patient has applied for disability.  I agree with this and agree that he should not be working.    2.  Hallucinations             -On Nuplazid.    -Discussed risks of using nuplazid and quietiapine together.  Discussed that these are both antipsychotics and the risks of increased in using them together, especially for increased QT.  Discussed what this meant.  They want to continue.  -QT/QTc normal in 10/2022   3.  Alcohol overuse             -long discussion about this.  I continue to think that alcohol is a problem in inciting hallucinations.  He is drinking at least a fifth of alcohol/week.  I am going to have his pcp help me wean this down as I bet this is promoting hallucinations.  I also had a long discussion with the patient's wife.  He is not driving, so she is buying alcohol.  Discussed that we would not want to stop this cold [place] and I am going to have his primary care help me wean this.   4.  B12 deficiency             -Recommend oral B12 supplementation.   5.  Cervical stenosis with syringomyelia             -Really would like to repeat this MRI of the cervical spine.  He and I talked about that for a long time.  He declined.   6.  Cognitive impairment  -Dr. Milbert Coulter did memory testing in December, 2022.  While that only revealed MCI, he was concerned with dementia with Lewy bodies.  While I really do not think he meets  criteria for that as he did not have profound memory impairment early in the disease, and really has not had memory interfere with social and occupational or ADLs (requirements for LBD), I do have concerns that memory impairment is profound enough to not be a candidate for DBS.  7.  Sialorrhea  -Was fairly significant in the office today.  Botox offered.  Patient does not want that right now.  States that it is not that bothersome to him  8.  Low back pain with possible lumbar radiculopathy  -He asked about pain medicine, but I do not want to do that without knowing what we are treating.  In addition, most pain medications will promote hallucinations. -Discussed physical therapy and sports medicine referral.  He would need home PT because he doesn't drive.  He will think about it.  He does not want either right now.  -Discussed MRI lumbar spine but he declined.    Subjective:   [name] [name] was seen today.  My previous records were reviewed prior to todays visit as well as outside records available  to me.  I have not actually seen the patient since last June, but we have actually worked him in a number of times, but there have been several no-shows and cancellations.  We have tried to address increasing hallucinations, but has been difficult with lack of follow-through with visits.  I have been able to connect with his primary care, who the patient has been faithfully following with.  The patient's primary care physician has been working with the patient and communicating well with me.  Patient also has stopped drinking alcohol since our last visit.  Patient is no longer driving.  He remains on the Nuplazid and Seroquel.  Notes from primary care indicate that he was told to remove weapons from the home.  When I saw the patient last visit, I did change him from the immediate release levodopa to Rytary.  He ended up going back to immediate release levodopa.  He reports today that ***  Current  prescribed movement disorder medications: Carbidopa/levodopa 25/100, 2 tablets at 6 AM/9:30 AM/1 PM/4:30 PM (he is taking 2.5 tablets at the 1pm dose);  Carbidopa/levodopa 50/200 CR at bedtime (added last visit) Nuplazid Seroquel, 25 mg, 1/2 in the AM, 1 at bed  PREVIOUS MEDICATIONS: pramipexole (increased hallucinations); Inbrija; quetiapine; Nuplazid; Rytary   PREVIOUS MEDICATIONS: pramipexole (increased hallucinations)  ALLERGIES:   Allergies  Allergen Reactions   Hydrocodone Nausea Only    CURRENT MEDICATIONS:  Outpatient Encounter Medications as of 06/21/2023  Medication Sig   aspirin EC 81 MG tablet Take 81 mg by mouth daily.   carbidopa-levodopa (SINEMET CR) 50-200 MG tablet TAKE ONE TABLET BY MOUTH EVERY NIGHT AT BEDTIME   carbidopa-levodopa (SINEMET IR) 25-100 MG tablet TAKE TWO TABLETS BY MOUTH AT THE FOLLOWING TIMES. 6AM/9:30AM/1PM/4:30PM. TAKE AN ADDITIONAL ONE HALF (1/2) TABLET AS NEEDED.   Levodopa (INBRIJA) 42 MG CAPS Samples of this drug were given to the patient, quantity 1, Lot Number [id number] Exp date 04/29/2023   Levodopa (INBRIJA) 42 MG CAPS You can inhale the capsules as needed up to 5 times per day, separated by 2 hour intervals.  Many patients use this right when the wake up to help with first morning "on" and then as needed during the day.   losartan-hydrochlorothiazide (HYZAAR) 100-25 MG tablet Take 1 tablet by mouth daily.   Pimavanserin Tartrate (NUPLAZID) 34 MG CAPS TAKE 1 CAPSULE BY MOUTH DAILY   QUEtiapine (SEROQUEL) 25 MG tablet Take 1 tablet (25 mg total) by mouth 2 (two) times daily.   vitamin B-12 (CYANOCOBALAMIN) 1000 MCG tablet Take 1,000 mcg by mouth daily.   [DISCONTINUED] potassium chloride (K-DUR) 10 MEQ tablet Take 1 tablet (10 mEq total) by mouth 2 (two) times daily.   No facility-administered encounter medications on file as of 06/21/2023.    Objective:   PHYSICAL EXAMINATION:    VITALS:   There were no vitals filed for this  visit.    GEN:  The patient appears stated age and is in NAD. HEENT:  Normocephalic, atraumatic.  Drooling is noted significantly (similar to prior).  The mucous membranes are moist. The superficial temporal arteries are without ropiness or tenderness. Cardiovascular: Regular rate rhythm Lungs: Clear to auscultation bilaterally Neck: No bruits  Neurological examination:  Orientation: The patient is alert and oriented x3.  He is slow to answer questions, but answers are appropriate Cranial nerves: There is good facial symmetry with significant facial hypomimia. The speech is fluent and clear.  He is hypophonic.  Soft palate rises symmetrically and  there is no tongue deviation. Hearing is intact to conversational tone. Sensation: Sensation is intact to light touch throughout Motor: Strength is at least antigravity x4.  Movement examination: Tone: He has mild increased tone on the R.  This is stable from last visit. Abnormal movements: He has no tremor today Coordination:  There is mild decremation on the right hand with finger taps and L foot with toe taps   I have reviewed and interpreted the following labs independently    Chemistry      Component Value Date/Time   NA 139 04/10/2023 0818   K 4.3 04/10/2023 0818   CL 101 04/10/2023 0818   CO2 30 04/10/2023 0818   BUN 14 04/10/2023 0818   CREATININE 0.96 04/10/2023 0818   CREATININE 0.91 08/14/2019 1534      Component Value Date/Time   CALCIUM 9.7 04/10/2023 0818   ALKPHOS 34 (L) 04/10/2023 0818   AST 19 04/10/2023 0818   ALT 6 04/10/2023 0818   BILITOT 0.7 04/10/2023 0818       Lab Results  Component Value Date   WBC 5.1 04/10/2023   HGB 15.8 04/10/2023   HCT 45.8 04/10/2023   MCV 96.6 04/10/2023   PLT 164.0 04/10/2023    Lab Results  Component Value Date   TSH 0.87 11/06/2022     Total time spent on today's visit was *** minutes, including both face-to-face time and nonface-to-face time.  Time included  that spent on review of records (prior notes available to me/labs/imaging if pertinent), discussing treatment and goals, answering patient's questions and coordinating care.  Cc:  Karie Schwalbe, MD

## 2023-06-21 ENCOUNTER — Ambulatory Visit: Payer: 59 | Admitting: Neurology

## 2023-06-21 VITALS — BP 116/74 | HR 54 | Ht 71.0 in | Wt 240.0 lb

## 2023-06-21 DIAGNOSIS — F1099 Alcohol use, unspecified with unspecified alcohol-induced disorder: Secondary | ICD-10-CM

## 2023-06-21 DIAGNOSIS — G20A2 Parkinson's disease without dyskinesia, with fluctuations: Secondary | ICD-10-CM

## 2023-06-21 DIAGNOSIS — R443 Hallucinations, unspecified: Secondary | ICD-10-CM

## 2023-06-21 MED ORDER — QUETIAPINE FUMARATE 25 MG PO TABS
ORAL_TABLET | ORAL | Status: DC
Start: 2023-06-21 — End: 2023-07-03

## 2023-06-21 MED ORDER — CARBIDOPA-LEVODOPA ER 25-100 MG PO TBCR: EXTENDED_RELEASE_TABLET | ORAL | 1 refills | Status: DC

## 2023-06-21 NOTE — Patient Instructions (Addendum)
Stop carbidopa/levodopa IR START carbidopa/levodopa 25/100, ER, 2 tablets at 6 AM/9:30 AM/1 PM/4:30 PM CONTINUE carbidopa/levodopa 50/200 at bed Take quetiapine, 25 mg, 1 in the AM, 1 in the afternoon and 2 at bed Continue Nuplazid, 34 mg at bedtime Wean down on the alcohol!!!  I need to see this GONE by next visit

## 2023-07-03 ENCOUNTER — Other Ambulatory Visit: Payer: Self-pay

## 2023-07-03 ENCOUNTER — Telehealth: Payer: Self-pay | Admitting: Neurology

## 2023-07-03 DIAGNOSIS — R443 Hallucinations, unspecified: Secondary | ICD-10-CM

## 2023-07-03 MED ORDER — QUETIAPINE FUMARATE 25 MG PO TABS
ORAL_TABLET | ORAL | 0 refills | Status: DC
Start: 2023-07-03 — End: 2023-09-17

## 2023-07-03 NOTE — Telephone Encounter (Signed)
 1. Which medications need to be refilled? (please list name of each medication and dose if known) QUEtiapine  (SEROQUEL ) 25 MG tablet [[id number]]   2. Which pharmacy/location (including street and city if local pharmacy) is medication to be sent to? [facility]   3. Do they need a 30 day or 90 day supply?

## 2023-07-03 NOTE — Telephone Encounter (Signed)
 RX has been sent.

## 2023-07-11 ENCOUNTER — Other Ambulatory Visit: Payer: Self-pay | Admitting: Neurology

## 2023-07-11 ENCOUNTER — Other Ambulatory Visit: Payer: Self-pay | Admitting: Internal Medicine

## 2023-07-11 DIAGNOSIS — G20A2 Parkinson's disease without dyskinesia, with fluctuations: Secondary | ICD-10-CM

## 2023-07-23 ENCOUNTER — Telehealth: Payer: Self-pay | Admitting: Neurology

## 2023-07-23 MED ORDER — INBRIJA 42 MG IN CAPS: ORAL_CAPSULE | RESPIRATORY_TRACT | 0 refills | Status: DC

## 2023-07-23 NOTE — Telephone Encounter (Signed)
 Patient wife notified that that inbrija has been sent to pharmacy.

## 2023-07-23 NOTE — Telephone Encounter (Signed)
 1. Which medications need refilled? (List name and dosage, if known) inbrija  2. Which pharmacy/location is medication to be sent to? (include street and city if local pharmacy) [facility]

## 2023-07-26 ENCOUNTER — Other Ambulatory Visit: Payer: Self-pay

## 2023-07-26 DIAGNOSIS — G20A2 Parkinson's disease without dyskinesia, with fluctuations: Secondary | ICD-10-CM

## 2023-07-26 MED ORDER — INBRIJA 42 MG IN CAPS
ORAL_CAPSULE | RESPIRATORY_TRACT | 0 refills | Status: DC
Start: 2023-07-26 — End: 2023-07-26

## 2023-07-26 MED ORDER — INBRIJA 42 MG IN CAPS
ORAL_CAPSULE | RESPIRATORY_TRACT | 0 refills | Status: DC
Start: 2023-07-26 — End: 2023-11-07

## 2023-08-14 ENCOUNTER — Ambulatory Visit: Payer: 59 | Admitting: Neurology

## 2023-09-11 ENCOUNTER — Ambulatory Visit: Admitting: Internal Medicine

## 2023-09-11 ENCOUNTER — Encounter: Payer: Self-pay | Admitting: Internal Medicine

## 2023-09-11 ENCOUNTER — Ambulatory Visit: Payer: Self-pay

## 2023-09-11 ENCOUNTER — Ambulatory Visit (INDEPENDENT_AMBULATORY_CARE_PROVIDER_SITE_OTHER)
Admission: RE | Admit: 2023-09-11 | Discharge: 2023-09-11 | Disposition: A | Discharge status: Home / Self Care | Source: Ambulatory Visit | Attending: Internal Medicine | Admitting: Internal Medicine

## 2023-09-11 VITALS — BP 134/84 | HR 63 | Temp 98.3°F | Ht 71.0 in | Wt 241.0 lb

## 2023-09-11 DIAGNOSIS — M25511 Pain in right shoulder: Secondary | ICD-10-CM | POA: Insufficient documentation

## 2023-09-11 NOTE — Telephone Encounter (Signed)
 Copied from CRM [id number]. Topic: Clinical - Red Word Triage >> Sep 11, 2023  8:12 AM Rosamond Comes wrote: Red Word that prompted transfer to Nurse Triage:Patient has Parkinson's  patient wife, Fayne Hoover calling in stating patient fell 3 times on Saturday outside, patient did not go to ER or Urgent care. Pain right side shoulder neck pain, all over back, when he fell the 2nd time on shoulder if felt like he jared his ribs. Pain has gotten worse. 6 pain scale.

## 2023-09-11 NOTE — Progress Notes (Signed)
 Subjective:    Patient ID: [name] [name], male    DOB: [birth year], 63 y.o.   MRN: [id number]  HPI Here with wife to check right shoulder Had 3 falls 4/12--chasing the dog (who was off the leash and chasing a car) Did finally get the dog Second fall he hurt shoulder--but still got up and   Could tell he hurt the shoulder ---felt like his shoulder hit his ribs Can move arm some --pain in chest and arm with abduction Not able to sleep due to the pain Tylenol ---may have helped some  Current Outpatient Medications on File Prior to Visit  Medication Sig Dispense Refill   aspirin EC 81 MG tablet Take 81 mg by mouth daily.     carbidopa-levodopa (SINEMET CR) 50-200 MG tablet TAKE ONE TABLET BY MOUTH EVERY NIGHT AT BEDTIME 90 tablet 0   Carbidopa-Levodopa ER (SINEMET CR) 25-100 MG tablet controlled release 2 tablets at 6 AM/9:30 AM/1 PM/4:30 PM 720 tablet 1   Levodopa (INBRIJA) 42 MG CAPS Samples of this drug were given to the patient, quantity 1, Lot Number [id number] Exp date 04/29/2023 60 capsule 0   Levodopa (INBRIJA) 42 MG CAPS You can inhale the capsules as needed up to 5 times per day, separated by 2 hour intervals.  Many patients use this right when the wake up to help with first morning "on" and then as needed during the day. 300 capsule 0   losartan-hydrochlorothiazide (HYZAAR) 100-25 MG tablet TAKE ONE TABLET BY MOUTH DAILY. 90 tablet 3   Pimavanserin Tartrate (NUPLAZID) 34 MG CAPS TAKE 1 CAPSULE BY MOUTH DAILY 90 capsule 0   QUEtiapine (SEROQUEL) 25 MG tablet 1 in the AM, 1 in the afternoon, 2 at bed 360 tablet 0   vitamin B-12 (CYANOCOBALAMIN) 1000 MCG tablet Take 1,000 mcg by mouth daily.     [DISCONTINUED] potassium chloride (K-DUR) 10 MEQ tablet Take 1 tablet (10 mEq total) by mouth 2 (two) times daily. 60 tablet 2   No current facility-administered medications on file prior to visit.    Allergies  Allergen Reactions   Hydrocodone Nausea Only    Past Medical History:   Diagnosis Date   Alcohol abuse 09/08/2019   Episodes of formed visual hallucinations    Essential hypertension, benign 01/31/2007   Herniated disc, cervical    History of skin cancer    basal cell; face and abdomen   Hyperlipidemia    Mild neurocognitive disorder with Lewy bodies, possible 05/03/2021   Parkinson's disease 04/27/2016   REM sleep behaviors    Syringomyelia 09/08/2019   Tremor of right hand 06/21/2015    Past Surgical History:  Procedure Laterality Date   BASAL CELL CARCINOMA EXCISION     EYE SURGERY     as a child   FINGER FRACTURE SURGERY     5th digit, left hand, x2   HERNIA REPAIR     age 36    Family History  Problem Relation Age of Onset   Hypertension Mother    Diabetes Father    Hypertension Father    Heart attack Father    Parkinson's disease Maternal Grandmother    Parkinsonism Maternal Grandmother     Social History   Socioeconomic History   Marital status: Married    Spouse name: Not on file   Number of children: 1   Years of education: 12   Highest education level: High school graduate  Occupational History   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  Musculoskeletal:     Comments: Right shoulder--no swelling and no striking tenderness. Hard to tell if clavicle in place but not overly tender Active abduction to 60% Internal and external rotation are reduced but not painful  Neurological:     Mental Status: He is alert.     Comments: No new arm weakness            Assessment & Plan:

## 2023-09-11 NOTE — Telephone Encounter (Signed)
 Pharmacy Patient Advocate Encounter  Received notification from [employer] that Prior Authorization for NUPLAZID has been APPROVED from 1.21.25 to 1.21.26   PA #/Case ID/Reference #: [id number]

## 2023-09-11 NOTE — Telephone Encounter (Signed)
Okay---I will check him at the appointment 

## 2023-09-11 NOTE — Assessment & Plan Note (Addendum)
 After direct trauma No apparent dislocation or obvious fracture--though could be clavicle Will check x-ray---possible non displaced glenoid fracture CT recommended Will put in sling---recommended he go to [facility] today

## 2023-09-11 NOTE — Telephone Encounter (Signed)
  Chief Complaint: Fall x2- hurt shoulder- pain Symptoms: above Frequency: Saturday Pertinent Negatives: Patient denies  Disposition: [] ED /[] Urgent Care (no appt availability in office) / [x] Appointment(In office/virtual)/ []  [facility]/ [] Home Care/ [] Refused Recommended Disposition /[] [facility]/ []  Follow-up with PCP Additional Notes: Pt fell 2 times while walking with dog along the side of the road. 2nd fall pt hurt his shoulder and pain continues now. Pt would not go to ED or UC. Appt for this morning scheduled.     Reason for Disposition  [1] MODERATE weakness (i.e., interferes with work, school, normal activities) AND [2] new-onset or worsening  Answer Assessment - Initial Assessment Questions 1. MECHANISM: "How did the fall happen?"     Walking quickly on Saturday 2. DOMESTIC VIOLENCE AND ELDER ABUSE SCREENING: "Did you fall because someone pushed you or tried to hurt you?" If Yes, ask: "Are you safe now?"     no 3. ONSET: "When did the fall happen?" (e.g., minutes, hours, or days ago)     Saturday 4. LOCATION: "What part of the body hit the ground?" (e.g., back, buttocks, head, hips, knees, hands, head, stomach)     shoulder 5. INJURY: "Did you hurt (injure) yourself when you fell?" If Yes, ask: "What did you injure? Tell me more about this?" (e.g., body area; type of injury; pain severity)"     Hurt shoulder 6. PAIN: "Is there any pain?" If Yes, ask: "How bad is the pain?" (e.g., Scale 1-10; or mild,  moderate, severe)   - NONE (0): No pain   - MILD (1-3): Doesn't interfere with normal activities    - MODERATE (4-7): Interferes with normal activities or awakens from sleep    - SEVERE (8-10): Excruciating pain, unable to do any normal activities      moderate 10. CAUSE: "What do you think caused the fall (or falling)?" (e.g., tripped, dizzy spell)       Walking too fast  Protocols used: Falls and [facility]

## 2023-09-17 ENCOUNTER — Other Ambulatory Visit: Payer: Self-pay | Admitting: Neurology

## 2023-09-17 DIAGNOSIS — G20A2 Parkinson's disease without dyskinesia, with fluctuations: Secondary | ICD-10-CM

## 2023-09-17 DIAGNOSIS — R443 Hallucinations, unspecified: Secondary | ICD-10-CM

## 2023-09-19 NOTE — Progress Notes (Unsigned)
 Assessment/Plan:   1.  Parkinsons Disease  -continue carbidopa /levodopa  25/100 CR, 2 tablets at 6 AM/9:30 AM/1 PM/4:30 PM  -Continue carbidopa /levodopa  50/200 at bed  -Continue Inbrija  as needed.  He only uses it about 1 time per week, since he is no longer working.  Samples provided.  - Patient needs direct assistance with medication.  He needs 24 hour/day care.  We talked extensively about this.  He should not be left alone.  He was seen in the office at approximately 3 PM and last took medication at 9 AM.  - Patient is no longer working, and apparently they applied for disability about a year ago and have not heard anything back since.  They apparently have an attorney and I told them to make sure they are following up.  There is absolutely no reason that he should not qualify for disability.  He not only has significant, disabling Parkinson's, but he also has very significant memory change and probable moderate dementia  -PT/OT/ST/social worker referral  - Sent correspondence to our Parkinsons Disease social worker as well  2.  Hallucinations             -On Nuplazid .    - Changed timing of quetiapine  25 mg so that he takes 2 in the morning and 2 at bedtime (not sure that he is actually taking the middle of the day dosage consistently)  -Discussed risks of using nuplazid  and quietiapine together.  Discussed that these are both antipsychotics and the risks of increased in using them together, especially for increased QT.  Discussed what this meant.  They want to continue.  -QT/QTc normal in 10/2022   3.  Alcohol overuse             - Unfortunately, this continues to be an issue.  I have talked with the patient, his wife, and his brother-in-law about alcohol and the roles that plays into memory change, including alcohol related dementia.  We have also told them that cannot be stopped cold [place] and needs to be weaned slowly.   4.  B12 deficiency             -Recommend oral B12  supplementation.   5.  Cervical stenosis with syringomyelia             -Really would like to repeat this MRI of the cervical spine.  He and I talked about that for a long time.  He declined.   6.  Cognitive impairment  -Dr. Kitty Perkins did memory testing in December, 2022.  While that only revealed MCI, he was concerned with dementia with Lewy bodies.  While I really do not think he meets criteria for that as he did not have profound memory impairment early in the disease, and really has not had memory interfere with social and occupational or ADLs (requirements for LBD), I do certainly think that he meets criteria now for moderate dementia.  - Long discussion, as above, with patient and wife that he needs 24 hour/day assistance and care.  All medicines need to be monitored.  Patient's wife is still working.  We discussed potential options including [facility].  His wife stated that she would not put him in a daycare center.  -amb referral to [facility]  7.  Sialorrhea  - This has become more bothersome to the patient, but he really does not like needles and does not want Botox right now.  8.  Low back pain with  possible lumbar radiculopathy  -He asked about pain medicine, but I do not want to do that without knowing what we are treating.  In addition, most pain medications will promote hallucinations. -Discussed physical therapy and sports medicine referral.  He would need home PT because he doesn't drive.  He will think about it.  He does not want either right now.  -Discussed MRI lumbar spine but he declined.    Subjective:   [name] [name] was seen today.  My previous records were reviewed prior to todays visit as well as outside records available to me. Pt with wife who supplements hx. last visit, I discontinued the immediate release levodopa  and changed him all to extended release.  I also increased his quetiapine .  He reports today that he is continuing to see men in trees and  kids playing soccer.  We discussed again last visit the importance of weaning the alcohol.  He's continuing to drink.  He did have 3 falls on April 12, but he was chasing his dog who was off the leash and the patient was trying to get the dog who was chasing a car.  He was evaluated his primary care who [facility].  He did that and was given a sling, told to take ibuprofen it was also given some Percocet.  He only had 1 other fall 2 days ago but he doesn't know how it happened.  His wife was at work when it happened.  They report that he is self administering his medications and his wife looks at the end of the day to see if they have been taken appropriately.  He actually took his pill container out while he was in the office today.  He was seen at 3 PM.  His last dose that he took was apparently 9 AM.  His wife notes that he is not able to dress himself and is putting on his clothes backwards.  He is calling her at work confused and with hallucinations.  Current prescribed movement disorder medications: Carbidopa /levodopa  25/100CR, 2 tablets at 6 AM/9:30 AM/1 PM/4:30 PM  Carbidopa /levodopa  50/200 CR at bedtime  Nuplazid  Seroquel , 25 mg, 1 in the morning, 1 in the afternoon, 2 in the evening (increased)  PREVIOUS MEDICATIONS: pramipexole  (increased hallucinations); Inbrija ; quetiapine ; Nuplazid ; Rytary  (given samples, but patient does not remember if he tried them or not); carbidopa /levodopa  IR (stopped b/c of hallucinations)   PREVIOUS MEDICATIONS: pramipexole  (increased hallucinations)  ALLERGIES:   Allergies  Allergen Reactions   Hydrocodone Nausea Only    CURRENT MEDICATIONS:  Outpatient Encounter Medications as of 09/20/2023  Medication Sig   aspirin EC 81 MG tablet Take 81 mg by mouth daily.   carbidopa -levodopa  (SINEMET  CR) 50-200 MG tablet TAKE ONE TABLET BY MOUTH EVERY NIGHT AT BEDTIME   Carbidopa -Levodopa  ER (SINEMET  CR) 25-100 MG tablet  controlled release 2 tablets at 6 AM/9:30 AM/1 PM/4:30 PM   Levodopa  (INBRIJA ) 42 MG CAPS Samples of this drug were given to the patient, quantity 1, Lot Number [id number] Exp date 04/29/2023   Levodopa  (INBRIJA ) 42 MG CAPS You can inhale the capsules as needed up to 5 times per day, separated by 2 hour intervals.  Many patients use this right when the wake up to help with first morning "on" and then as needed during the day.   losartan -hydrochlorothiazide  (HYZAAR) 100-25 MG tablet TAKE ONE TABLET BY MOUTH DAILY.   Pimavanserin Tartrate  (NUPLAZID ) 34 MG CAPS TAKE 1 CAPSULE  BY MOUTH DAILY   QUEtiapine  (SEROQUEL ) 25 MG tablet TAKE ONE TABLET BY MOUTH IN THE MORNING, ONE TABLET IN THE AFTERNOON, AND TWO TABLETS AT BEDTIME   vitamin B-12 (CYANOCOBALAMIN ) 1000 MCG tablet Take 1,000 mcg by mouth daily.   [DISCONTINUED] Pimavanserin Tartrate  (NUPLAZID ) 34 MG CAPS TAKE 1 CAPSULE BY MOUTH DAILY   [DISCONTINUED] potassium chloride  (K-DUR) 10 MEQ tablet Take 1 tablet (10 mEq total) by mouth 2 (two) times daily.   No facility-administered encounter medications on file as of 09/20/2023.    Objective:   PHYSICAL EXAMINATION:    VITALS:   Vitals:   09/20/23 1425  BP: (!) 142/80  Pulse: (!) 55  SpO2: 97%  Weight: 240 lb 3.2 oz (109 kg)    GEN:  The patient appears stated age and is in NAD. HEENT:  Normocephalic, atraumatic.   The mucous membranes are moist. The superficial temporal arteries are without ropiness or tenderness.  He does have some drooling. Cardiovascular: Regular rate rhythm Lungs: Clear to auscultation bilaterally Neck: No bruits  Neurological examination:  Orientation: The patient is alert and oriented x3.  He has psychomotor retardation and answers questions slowly, but most are accurate. Cranial nerves: There is good facial symmetry with significant facial hypomimia. The speech is fluent and clear.  He is hypophonic.  Soft palate rises symmetrically and there is no tongue  deviation. Hearing is intact to conversational tone. Sensation: Sensation is intact to light touch throughout Motor: Strength is at least antigravity x4.  Movement examination: Tone: He has mild increased tone on the R.  This is stable from last visit. Abnormal movements: No significant tremor noted today. Coordination:  There is mild decremation on the right hand with finger taps and L foot with toe taps Gait and Station: Patient is forward flexed.  He festinate some, but is fairly stable today as long as he takes his time.  He does ambulate on his toes.   I have reviewed and interpreted the following labs independently    Chemistry      Component Value Date/Time   NA 139 04/10/2023 0818   K 4.3 04/10/2023 0818   CL 101 04/10/2023 0818   CO2 30 04/10/2023 0818   BUN 14 04/10/2023 0818   CREATININE 0.96 04/10/2023 0818   CREATININE 0.91 08/14/2019 1534      Component Value Date/Time   CALCIUM 9.7 04/10/2023 0818   ALKPHOS 34 (L) 04/10/2023 0818   AST 19 04/10/2023 0818   ALT 6 04/10/2023 0818   BILITOT 0.7 04/10/2023 0818       Lab Results  Component Value Date   WBC 5.1 04/10/2023   HGB 15.8 04/10/2023   HCT 45.8 04/10/2023   MCV 96.6 04/10/2023   PLT 164.0 04/10/2023    Lab Results  Component Value Date   TSH 0.87 11/06/2022     Total time spent on today's visit was 60 minutes, including both face-to-face time and nonface-to-face time.  Time included that spent on review of records (prior notes available to me/labs/imaging if pertinent), discussing treatment and goals, answering patient's questions and coordinating care.  Cc:  Helaine Llanos, MD

## 2023-09-20 ENCOUNTER — Other Ambulatory Visit: Payer: Self-pay | Admitting: Neurology

## 2023-09-20 ENCOUNTER — Ambulatory Visit: Payer: 59 | Admitting: Neurology

## 2023-09-20 ENCOUNTER — Encounter: Payer: Self-pay | Admitting: Neurology

## 2023-09-20 VITALS — BP 142/80 | HR 55 | Wt 240.2 lb

## 2023-09-20 DIAGNOSIS — G20A2 Parkinson's disease without dyskinesia, with fluctuations: Secondary | ICD-10-CM | POA: Diagnosis not present

## 2023-09-20 DIAGNOSIS — R443 Hallucinations, unspecified: Secondary | ICD-10-CM | POA: Diagnosis not present

## 2023-09-20 DIAGNOSIS — R296 Repeated falls: Secondary | ICD-10-CM | POA: Diagnosis not present

## 2023-09-20 DIAGNOSIS — G20A1 Parkinson's disease without dyskinesia, without mention of fluctuations: Secondary | ICD-10-CM

## 2023-09-20 DIAGNOSIS — R441 Visual hallucinations: Secondary | ICD-10-CM

## 2023-09-20 DIAGNOSIS — F02B0 Dementia in other diseases classified elsewhere, moderate, without behavioral disturbance, psychotic disturbance, mood disturbance, and anxiety: Secondary | ICD-10-CM

## 2023-09-20 MED ORDER — INBRIJA 42 MG IN CAPS: ORAL_CAPSULE | RESPIRATORY_TRACT | Status: DC

## 2023-09-20 MED ORDER — QUETIAPINE FUMARATE 25 MG PO TABS
50.0000 mg | ORAL_TABLET | Freq: Two times a day (BID) | ORAL | Status: DC
Start: 2023-09-20 — End: 2023-12-11

## 2023-09-24 ENCOUNTER — Telehealth: Payer: Self-pay

## 2023-09-24 NOTE — Progress Notes (Signed)
 Complex Care Management Note  Care Guide Note 09/24/2023 Name: [name] [name] MRN: [id number] DOB: [birth year]  [name] [name] is a 63 y.o. year old male who sees Helaine Llanos, MD for primary care. I reached out to [name] [name] by phone today to offer complex care management services.  [name] was given information about Complex Care Management services today including:   The Complex Care Management services include support from the care team which includes your Nurse Care Manager, Clinical Social Worker, or Pharmacist.  The Complex Care Management team is here to help remove barriers to the health concerns and goals most important to you. Complex Care Management services are voluntary, and the patient may decline or stop services at any time by request to their care team member.   Complex Care Management Consent Status: Patient agreed to services and verbal consent obtained.   Follow up plan:  Telephone appointment with complex care management team member scheduled for:  10/03/2023  Encounter Outcome:  Patient Scheduled

## 2023-10-01 ENCOUNTER — Telehealth: Payer: Self-pay | Admitting: Neurology

## 2023-10-01 NOTE — Telephone Encounter (Signed)
 [name] [name] with [facility] called and LM. She needs to request services for skilled nursing.  1x for 1 week Every other week for 6 weeks.  Please call to confirm

## 2023-10-01 NOTE — Telephone Encounter (Signed)
 [facility] called in and left a message with the after hours service. She is needing PT orders 2 times a week for 8 weeks. 1 time for 1 week, also needs OT evaluation.

## 2023-10-01 NOTE — Telephone Encounter (Signed)
 Called angela back she said we if course asked for PT and OT I asked her what skilled nursing is for and she said that is is for disease management and ensuring patient is taking meds properly and that he is getting care at least once a week for that disease management ?

## 2023-10-01 NOTE — Telephone Encounter (Signed)
Gave approval

## 2023-10-01 NOTE — Telephone Encounter (Signed)
Called and gave authorization.

## 2023-10-03 ENCOUNTER — Telehealth: Payer: Self-pay | Admitting: Neurology

## 2023-10-03 ENCOUNTER — Other Ambulatory Visit: Payer: Self-pay | Admitting: Licensed Clinical Social Worker

## 2023-10-03 NOTE — Patient Outreach (Signed)
 Complex Care Management   Visit Note  10/03/2023  Name:  [name] [name] MRN: [id number] DOB: 14-Feb-1961  Situation: Referral received for Complex Care Management related to  caregiver support  I obtained verbal consent from Caregiver.  Visit completed with pt spouse [name] [name]  on the phone  Background:   Past Medical History:  Diagnosis Date   Alcohol abuse 09/08/2019   Episodes of formed visual hallucinations    Essential hypertension, benign 01/31/2007   Herniated disc, cervical    History of skin cancer    basal cell; face and abdomen   Hyperlipidemia    Mild neurocognitive disorder with Lewy bodies, possible 05/03/2021   Parkinson's disease 04/27/2016   REM sleep behaviors    Syringomyelia 09/08/2019   Tremor of right hand 06/21/2015    Assessment: Patient Reported Symptoms:  Cognitive Cognitive Status: Unable to Assess (spk with pt spouse)      Neurological Neurological Review of Symptoms: Not assessed (pt currently inpatient wiht [facility])    HEENT HEENT Symptoms Reported: Not assessed (pt currently inpatient wiht [facility])      Cardiovascular      Respiratory      Endocrine Patient reports the following symptoms related to hypoglycemia or hyperglycemia : Not assessed (pt currently inpatient wiht [facility])    Gastrointestinal Gastrointestinal Symptoms Reported: Not assessed (pt currently inpatient wiht [facility])      Genitourinary Genitourinary Symptoms Reported: Not assessed (pt currently inpatient [facility])    Integumentary Integumentary Symptoms Reported: Not assessed (pt currently inpatient wiht [facility])    Musculoskeletal Musculoskelatal Symptoms Reviewed: Not assessed (pt currently inpatient wiht [facility])        Psychosocial       Do you feel physically threatened by others?: No      04/10/2023    7:33 AM  Depression screen PHQ 2/9  Decreased Interest 2  Down, Depressed, Hopeless 2  PHQ - 2 Score 4  Altered sleeping 0  Tired, decreased energy 0  Change in  appetite 1  Feeling bad or failure about yourself  3  Trouble concentrating 3  Moving slowly or fidgety/restless 3  Suicidal thoughts 0  PHQ-9 Score 14  Difficult doing work/chores Extremely dIfficult    There were no vitals filed for this visit.  Medications Reviewed Today     Reviewed by Fletcher Humble, LCSW (Social Worker) on 10/03/23 at 1454  Med List Status: <None>   Medication Order Taking? Sig Documenting Provider Last Dose Status Informant  aspirin EC 81 MG tablet [id number] No Take 81 mg by mouth daily. [provider] Taking Active   carbidopa -levodopa  (SINEMET  CR) 50-200 MG tablet [id number] No TAKE ONE TABLET BY MOUTH EVERY NIGHT AT BEDTIME Tat, Von Grumbling, DO Taking Active   Carbidopa -Levodopa  ER (SINEMET  CR) 25-100 MG tablet controlled release [id number] No 2 tablets at 6 AM/9:30 AM/1 PM/4:30 PM Tat, Von Grumbling, DO Taking Active   Levodopa  (INBRIJA ) 42 MG CAPS [id number] No Samples of this drug were given to the patient, quantity 1, Lot Number [id number] Exp date 04/29/2023 Tat, Von Grumbling, DO Taking Active   Levodopa  (INBRIJA ) 42 MG CAPS [id number] No You can inhale the capsules as needed up to 5 times per day, separated by 2 hour intervals.  Many patients use this right when the wake up to help with first morning "on" and then as needed during the day. Shirline Dover, DO Taking Active   Levodopa  (INBRIJA ) 42 MG CAPS [id number]  Samples of this drug were  given to the patient, quantity 1, Lot Number [id number] Exp 1-27 Tat, Von Grumbling, DO  Active   losartan -hydrochlorothiazide  (HYZAAR) 100-25 MG tablet [id number] No TAKE ONE TABLET BY MOUTH DAILY. Helaine Llanos, MD Taking Active   Pimavanserin Tartrate  (NUPLAZID ) 34 MG CAPS [id number] No TAKE 1 CAPSULE BY MOUTH DAILY Tat, Von Grumbling, DO Taking Active   Discontinued 10/03/12 0809 (Reorder)   QUEtiapine  (SEROQUEL ) 25 MG tablet [id number]  Take 2 tablets (50 mg total) by mouth 2 (two) times daily. TAKE ONE TABLET BY MOUTH IN THE  MORNING, ONE TABLET IN THE AFTERNOON, AND TWO TABLETS AT BEDTIME Tat, Von Grumbling, DO  Active   vitamin B-12 (CYANOCOBALAMIN ) 1000 MCG tablet [id number] No Take 1,000 mcg by mouth daily. [provider] Taking Active             Recommendation:   No recommendations pt spouse [name]. [name] denies assistance with securing caregiver support. [name]. [name] report home health is going well and she and pt do not desire further services.   Follow Up Plan:   Closing From:  Complex Care Management  Fletcher Humble MSW, LCSW Licensed Clinical Social Worker  [facility], Population Health Direct Dial: [phone]  Fax: [phone]

## 2023-10-03 NOTE — Patient Instructions (Signed)
 Visit Information  Thank you for taking time to visit with me today. Please don't hesitate to contact me if I can be of assistance to you before our next scheduled appointment.  Your next care management appointment is no further scheduled appointments.    Closing From: Complex Care Management.  Please call the care guide team at [phone] if you need to cancel, schedule, or reschedule an appointment.   Please call 911 if you are experiencing a Mental Health or Behavioral Health Crisis or need someone to talk to.  Fletcher Humble MSW, LCSW Licensed Clinical Social Worker  [facility], Population Health Direct Dial: [phone]  Fax: [phone]

## 2023-10-03 NOTE — Telephone Encounter (Signed)
 Pt's wife called in wanting to get some information on caregiver support groups and some support groups for the patient.

## 2023-10-04 ENCOUNTER — Telehealth: Payer: Self-pay | Admitting: Neurology

## 2023-10-04 NOTE — Telephone Encounter (Signed)
 Called and gave approval for  ST

## 2023-10-04 NOTE — Telephone Encounter (Signed)
Called and gave approval 

## 2023-10-04 NOTE — Telephone Encounter (Signed)
 [facility] called to get orders. She needs speech therapy once a week for 8 weeks.

## 2023-10-10 ENCOUNTER — Encounter: Payer: Self-pay | Admitting: Neurology

## 2023-10-15 ENCOUNTER — Telehealth: Payer: Self-pay

## 2023-10-15 ENCOUNTER — Other Ambulatory Visit: Payer: Self-pay

## 2023-10-15 DIAGNOSIS — R296 Repeated falls: Secondary | ICD-10-CM

## 2023-10-16 NOTE — Telephone Encounter (Signed)
 Sent to Fax [phone]  for 3 in one bedside commode at [facility]) and called Josiah Nigh at [phone].per Dr. Winferd Hatter okay orders.

## 2023-10-19 ENCOUNTER — Telehealth: Payer: Self-pay | Admitting: Neurology

## 2023-10-19 NOTE — Telephone Encounter (Signed)
 Melissa from [facility] home health called to notify us  the pt missed today's OT visit. The pt wants to wait until next week.

## 2023-10-23 ENCOUNTER — Telehealth: Payer: Self-pay | Admitting: Neurology

## 2023-10-23 NOTE — Telephone Encounter (Signed)
 Referral missing verbiage in the notes for bedside toilet, can continue processing the order once faxed [phone]

## 2023-10-24 ENCOUNTER — Telehealth: Payer: Self-pay | Admitting: Neurology

## 2023-10-24 NOTE — Telephone Encounter (Signed)
 Melissa hinton with [facility] called and LM with AN. Pt will missed OT due to insurance denial

## 2023-10-26 ENCOUNTER — Telehealth: Payer: Self-pay | Admitting: Pharmacy Technician

## 2023-10-26 ENCOUNTER — Other Ambulatory Visit (HOSPITAL_COMMUNITY): Payer: Self-pay

## 2023-10-26 NOTE — Telephone Encounter (Signed)
 Pharmacy Patient Advocate Encounter   Received notification from CoverMyMeds that prior authorization for NUPLAZID  is required/requested.   Insurance verification completed.   The patient is insured through [employer] [facility] .   Per test claim: PA required; PA submitted to above mentioned insurance via CoverMyMeds Key/confirmation #/EOC [id number] Status is pending

## 2023-10-31 ENCOUNTER — Emergency Department

## 2023-10-31 ENCOUNTER — Emergency Department
Admission: EM | Admit: 2023-10-31 | Discharge: 2023-10-31 | Disposition: A | Discharge status: Home / Self Care | Attending: Emergency Medicine | Admitting: Emergency Medicine

## 2023-10-31 ENCOUNTER — Other Ambulatory Visit: Payer: Self-pay

## 2023-10-31 DIAGNOSIS — M545 Low back pain, unspecified: Secondary | ICD-10-CM | POA: Diagnosis not present

## 2023-10-31 DIAGNOSIS — M5030 Other cervical disc degeneration, unspecified cervical region: Secondary | ICD-10-CM | POA: Insufficient documentation

## 2023-10-31 DIAGNOSIS — T07XXXA Unspecified multiple injuries, initial encounter: Secondary | ICD-10-CM

## 2023-10-31 DIAGNOSIS — T148XXA Other injury of unspecified body region, initial encounter: Secondary | ICD-10-CM | POA: Insufficient documentation

## 2023-10-31 DIAGNOSIS — W57XXXA Bitten or stung by nonvenomous insect and other nonvenomous arthropods, initial encounter: Secondary | ICD-10-CM

## 2023-10-31 DIAGNOSIS — S90812A Abrasion, left foot, initial encounter: Secondary | ICD-10-CM | POA: Diagnosis not present

## 2023-10-31 DIAGNOSIS — G20A1 Parkinson's disease without dyskinesia, without mention of fluctuations: Secondary | ICD-10-CM | POA: Diagnosis not present

## 2023-10-31 DIAGNOSIS — S80812A Abrasion, left lower leg, initial encounter: Secondary | ICD-10-CM | POA: Diagnosis not present

## 2023-10-31 DIAGNOSIS — W19XXXA Unspecified fall, initial encounter: Secondary | ICD-10-CM | POA: Diagnosis not present

## 2023-10-31 DIAGNOSIS — S8992XA Unspecified injury of left lower leg, initial encounter: Secondary | ICD-10-CM | POA: Diagnosis present

## 2023-10-31 DIAGNOSIS — M79672 Pain in left foot: Secondary | ICD-10-CM

## 2023-10-31 DIAGNOSIS — Z8669 Personal history of other diseases of the nervous system and sense organs: Secondary | ICD-10-CM

## 2023-10-31 LAB — URINALYSIS, W/ REFLEX TO CULTURE (INFECTION SUSPECTED)
Bacteria, UA: NONE SEEN
Bilirubin Urine: NEGATIVE
Glucose, UA: NEGATIVE mg/dL
Hgb urine dipstick: NEGATIVE
Ketones, ur: 5 mg/dL — AB
Leukocytes,Ua: NEGATIVE
Nitrite: NEGATIVE
Protein, ur: NEGATIVE mg/dL
Specific Gravity, Urine: 1.014 (ref 1.005–1.030)
pH: 5 (ref 5.0–8.0)

## 2023-10-31 LAB — CBC WITH DIFFERENTIAL/PLATELET
Abs Immature Granulocytes: 0.05 10*3/uL (ref 0.00–0.07)
Basophils Absolute: 0 10*3/uL (ref 0.0–0.1)
Basophils Relative: 0 %
Eosinophils Absolute: 0 10*3/uL (ref 0.0–0.5)
Eosinophils Relative: 0 %
HCT: 43 % (ref 39.0–52.0)
Hemoglobin: 15.6 g/dL (ref 13.0–17.0)
Immature Granulocytes: 0 %
Lymphocytes Relative: 8 %
Lymphs Abs: 1 10*3/uL (ref 0.7–4.0)
MCH: 33.4 pg (ref 26.0–34.0)
MCHC: 36.3 g/dL — ABNORMAL HIGH (ref 30.0–36.0)
MCV: 92.1 fL (ref 80.0–100.0)
Monocytes Absolute: 0.8 10*3/uL (ref 0.1–1.0)
Monocytes Relative: 6 %
Neutro Abs: 10.8 10*3/uL — ABNORMAL HIGH (ref 1.7–7.7)
Neutrophils Relative %: 86 %
Platelets: 149 10*3/uL — ABNORMAL LOW (ref 150–400)
RBC: 4.67 MIL/uL (ref 4.22–5.81)
RDW: 11.9 % (ref 11.5–15.5)
WBC: 12.8 10*3/uL — ABNORMAL HIGH (ref 4.0–10.5)
nRBC: 0 % (ref 0.0–0.2)

## 2023-10-31 LAB — COMPREHENSIVE METABOLIC PANEL WITH GFR
ALT: <5 U/L (ref 0–44)
AST: 26 U/L (ref 15–41)
Albumin: 5.1 g/dL — ABNORMAL HIGH (ref 3.5–5.0)
Alkaline Phosphatase: 41 U/L (ref 38–126)
Anion gap: 11 (ref 5–15)
BUN: 18 mg/dL (ref 8–23)
CO2: 25 mmol/L (ref 22–32)
Calcium: 9.4 mg/dL (ref 8.9–10.3)
Chloride: 104 mmol/L (ref 98–111)
Creatinine, Ser: 0.8 mg/dL (ref 0.61–1.24)
GFR, Estimated: >60 mL/min (ref >60)
Glucose, Bld: 109 mg/dL — ABNORMAL HIGH (ref 70–99)
Potassium: 3.4 mmol/L — ABNORMAL LOW (ref 3.5–5.1)
Sodium: 140 mmol/L (ref 135–145)
Total Bilirubin: 1.1 mg/dL (ref 0.0–1.2)
Total Protein: 7.6 g/dL (ref 6.5–8.1)

## 2023-10-31 LAB — ETHANOL: Alcohol, Ethyl (B): <15 mg/dL (ref <15)

## 2023-10-31 LAB — CK: Total CK: 224 U/L (ref 49–397)

## 2023-10-31 MED ORDER — SODIUM CHLORIDE 0.9 % IV BOLUS
1000.0000 mL | Freq: Once | INTRAVENOUS | Status: AC
  Administered 2023-10-31: 1000 mL via INTRAVENOUS

## 2023-10-31 MED ORDER — ACETAMINOPHEN 500 MG PO TABS
1000.0000 mg | ORAL_TABLET | Freq: Once | ORAL | Status: AC
  Administered 2023-10-31: 1000 mg via ORAL
  Filled 2023-10-31: qty 2

## 2023-10-31 MED ORDER — CARBIDOPA-LEVODOPA 25-100 MG PO TABS
2.0000 | ORAL_TABLET | Freq: Once | ORAL | Status: AC
  Administered 2023-10-31: 2 via ORAL
  Filled 2023-10-31: qty 2

## 2023-10-31 MED ORDER — LIDOCAINE 5 % EX PTCH: 1.0000 | MEDICATED_PATCH | CUTANEOUS | 0 refills | Status: AC

## 2023-10-31 MED ORDER — LIDOCAINE 5 % EX PTCH
1.0000 | MEDICATED_PATCH | CUTANEOUS | Status: DC
  Administered 2023-10-31: 1 via TRANSDERMAL
  Filled 2023-10-31: qty 1

## 2023-10-31 MED ORDER — LIDOCAINE 5 % EX PTCH
1.0000 | MEDICATED_PATCH | CUTANEOUS | 0 refills | Status: DC
Start: 2023-10-31 — End: 2023-10-31

## 2023-10-31 NOTE — ED Triage Notes (Signed)
 Patient arrives by [facility] after a fall in the woods tripping while trying to retrieve his dog.  He was last seen at 1600, and was fouind at 61.  Patient has no complaints, but was too weak to get himself up after falling.  Patient has scratches and abrasions from being on the ground.  Patient has hx of Parkinson's.

## 2023-10-31 NOTE — ED Provider Notes (Signed)
 [facility] Provider Note    Event Date/Time   First MD Initiated Contact with Patient 10/31/23 1949     (approximate)   History   Fall   HPI  [name] [name] is a 63 y.o. male with history of Parkinson's, mild neurocognitive disorder with Lewy body, history of alcohol use, hyperlipidemia, presenting with fall.  Per EMS patient was out in the woods, states that his dog pulled away from him and he was trying to retrieve him, had tripped and was too weak to get himself up after falling.  Was noted to have scratches and abrasions, several ticks this was noted around him.  He was last seen at 4:00 today.  Patient denies any headache, no chest pain or abdominal pain, states that he has bilateral upper shoulder pain as well as lower back pain.  Also left foot pain.  He denies any new weakness or numbness focally.  No headache or vision changes.   Interval history obtained from EMS as above.  On independent review, he was seen by his neurologist in April of this year, history of Parkinson's disease, history of hallucinations, also history of alcohol use.  He does have history of cognitive impairment, they had a long discussion with patient and wife that he needs 24-hour or day assistance and care.  Physical Exam   Triage Vital Signs: ED Triage Vitals  Encounter Vitals Group     BP 10/31/23 1956 135/60     Systolic BP Percentile --      Diastolic BP Percentile --      Pulse Rate 10/31/23 1956 83     Resp 10/31/23 1956 (!) 26     Temp 10/31/23 1956 98.1 F (36.7 C)     Temp Source 10/31/23 1956 Oral     SpO2 10/31/23 1956 95 %     Weight --      Height --      Head Circumference --      Peak Flow --      Pain Score 10/31/23 1958 8     Pain Loc --      Pain Education --      Exclude from Growth Chart --     Most recent vital signs: Vitals:   10/31/23 2100 10/31/23 2130  BP: (!) 126/44 (!) 167/81  Pulse: 81 80  Resp: (!) 25 20  Temp:    SpO2: 94%  96%     General: Awake, no distress.  CV:  Good peripheral perfusion.  Resp:  Normal effort.  No thoracic cage tenderness Abd:  No distention.  Abdomen soft nontender Other:  Full range of motion of all extremities are intact, some slight stiffness to the left lower extremity from his Parkinson's, he has no focal weakness or numbness, he did have 2 takes they were not engorged to his right lateral body that were removed.  Abrasions noted to his legs and torso, he is some tenderness palpation with a superficial abrasion to the dorsum of his left foot.  No palpable skull deformities or tenderness, no midline spinal tenderness, he is some tenderness palpation to the bilateral trapezius as well as bilateral paralumbar region.  No saddle anesthesia.   ED Results / Procedures / Treatments   Labs (all labs ordered are listed, but only abnormal results are displayed) Labs Reviewed  COMPREHENSIVE METABOLIC PANEL WITH GFR - Abnormal; Notable for the following components:      Result Value   Potassium  3.4 (*)    Glucose, Bld 109 (*)    Albumin 5.1 (*)    All other components within normal limits  CBC WITH DIFFERENTIAL/PLATELET - Abnormal; Notable for the following components:   WBC 12.8 (*)    MCHC 36.3 (*)    Platelets 149 (*)    Neutro Abs 10.8 (*)    All other components within normal limits  URINALYSIS, W/ REFLEX TO CULTURE (INFECTION SUSPECTED) - Abnormal; Notable for the following components:   Color, Urine YELLOW (*)    APPearance CLEAR (*)    Ketones, ur 5 (*)    All other components within normal limits  CK  ETHANOL     EKG  EKG shows, sinus rhythm, rate 87, normal QS, normal QTc, no ischemic ST elevations, no obvious T wave changes, no prior to compare   RADIOLOGY On my independent interpretation, CT head without obvious intracranial hemorrhage   PROCEDURES:  Critical Care performed: No  Procedures   MEDICATIONS ORDERED IN ED: Medications  lidocaine (LIDODERM) 5  % 1 patch (1 patch Transdermal Patch Applied 10/31/23 2139)  sodium chloride  0.9 % bolus 1,000 mL (0 mLs Intravenous Stopped 10/31/23 2130)  carbidopa -levodopa  (SINEMET  IR) 25-100 MG per tablet immediate release 2 tablet (2 tablets Oral Given 10/31/23 2040)  acetaminophen (TYLENOL) tablet 1,000 mg (1,000 mg Oral Given 10/31/23 2139)     IMPRESSION / MDM / ASSESSMENT AND PLAN / ED COURSE  I reviewed the triage vital signs and the nursing notes.                              Differential diagnosis includes, but is not limited to, for the fall, considered fracture, contusion, musculoskeletal pain, strain, did consider intracranial hemorrhage but patient is alert, moving all 4 extremities without focal weakness or numbness, will get a CT head, cervical spine, x-ray of the foot.  For the fall, consider this is mechanical, also consider alcohol intoxication, electrolyte derangements, dehydration, rhabdomyolysis will get labs, ethanol level, CK, IV fluids.  Reassess.  Patient's presentation is most consistent with acute presentation with potential threat to life or bodily function.  Independent interpretation of labs and imaging below.  On reassessment of patient, wife and his brother-in-law are in the room, discussed with them about care at home, she states that she had worked with home health as well as PT and OT who came to evaluate, had a couple of sessions and then found out that insurance did not approve the home health as well as PT and OT, wife states that she is looking around for adult daycare's and also seeing if he can accompany her to work.  Her brother is able to watch him at home and they have multiple neighbors around who are also helping out.  They would like to take him home, do not want him to be placed at this time.  Discussed with them about following up with his primary care doctor as well as his insurance to see what will be covered and if there are any other agencies that they can utilize for  home health.  They are agreeable with this plan.  Considered but no indication for inpatient admission at this time, he safe for outpatient management.  Shared decision making patient and family and they are agreeable with this plan.  Instructed to follow-up with primary care doctor this week for further management of his symptoms.  Strict return precautions given.  Discharge.  The patient is on the cardiac monitor to evaluate for evidence of arrhythmia and/or significant heart rate changes.  Clinical Course as of 10/31/23 2208  Wed Oct 31, 2023  2039 DG Foot Complete Left 1. No acute fracture or dislocation.  [TT]  2102 CT Head Wo Contrast IMPRESSION: 1. No acute intracranial abnormality. 2. No acute fracture or malalignment of the cervical spine. 3. Moderate degenerative disc disease of C4-C7 with moderate to severe left neuroforaminal narrowing at C4-5.   [TT]  2207 Urinalysis, w/ Reflex to Culture (Infection Suspected) -Urine, Clean Catch(!) UA is not consistent with UTI. [TT]    Clinical Course User Index [TT] Shane Darling, MD     FINAL CLINICAL IMPRESSION(S) / ED DIAGNOSES   Final diagnoses:  Fall, initial encounter  Left foot pain  Multiple abrasions  Tick bite with subsequent removal of tick  History of Parkinson's disease  Degenerative disc disease, cervical  Acute bilateral low back pain without sciatica     Rx / DC Orders   ED Discharge Orders          Ordered    lidocaine (LIDODERM) 5 %  Every 24 hours        10/31/23 2151             Note:  This document was prepared using Dragon voice recognition software and may include unintentional dictation errors.    Shane Darling, MD 10/31/23 [phone]

## 2023-10-31 NOTE — Discharge Instructions (Addendum)
 You can take 650 mg of Tylenol every 6 hours as needed for the pain.  Please make sure to follow-up with your primary care doctor for further management of your symptoms as well as to follow-up about any additional resources for home health.

## 2023-11-07 ENCOUNTER — Other Ambulatory Visit: Payer: Self-pay

## 2023-11-07 DIAGNOSIS — G20A2 Parkinson's disease without dyskinesia, with fluctuations: Secondary | ICD-10-CM

## 2023-11-07 MED ORDER — INBRIJA 42 MG IN CAPS: ORAL_CAPSULE | RESPIRATORY_TRACT | 3 refills | Status: DC

## 2023-11-08 ENCOUNTER — Telehealth: Payer: Self-pay | Admitting: Neurology

## 2023-11-08 ENCOUNTER — Other Ambulatory Visit: Payer: Self-pay

## 2023-11-08 DIAGNOSIS — R441 Visual hallucinations: Secondary | ICD-10-CM

## 2023-11-08 MED ORDER — NUPLAZID 34 MG PO CAPS
1.0000 | ORAL_CAPSULE | Freq: Every day | ORAL | 0 refills | Status: DC
Start: 2023-11-08 — End: 2024-02-12

## 2023-11-08 NOTE — Telephone Encounter (Signed)
 Pharmacy sent faxed refill 11/05/23 no reply needs refil of Rx Nuplazid  fax#[phone]

## 2023-11-08 NOTE — Telephone Encounter (Signed)
 Rx has been sent to the pharmacy

## 2023-11-14 ENCOUNTER — Other Ambulatory Visit: Payer: Self-pay

## 2023-11-14 DIAGNOSIS — G20A2 Parkinson's disease without dyskinesia, with fluctuations: Secondary | ICD-10-CM

## 2023-11-14 MED ORDER — INBRIJA 42 MG IN CAPS: ORAL_CAPSULE | RESPIRATORY_TRACT | 3 refills | Status: DC

## 2023-11-29 ENCOUNTER — Other Ambulatory Visit (HOSPITAL_COMMUNITY): Payer: Self-pay

## 2023-11-29 ENCOUNTER — Telehealth: Payer: Self-pay | Admitting: Pharmacy Technician

## 2023-11-29 NOTE — Telephone Encounter (Signed)
 Pharmacy Patient Advocate Encounter   Received notification from Fax that prior authorization for INBRIJA   is required/requested.   Insurance verification completed.   The patient is insured through [facility] .   Per test claim: PA required; PA submitted to above mentioned insurance via CoverMyMeds Key/confirmation #/EOC [id number] Status is pending

## 2023-11-29 NOTE — Telephone Encounter (Signed)
 Pharmacy Patient Advocate Encounter  Received notification from [employer] that Prior Authorization for INBRIJA  42MG  has been APPROVED from 7.3.25 to 12.31.25   PA #/Case ID/Reference #:  [id number]  PA is approved however, when running the test bill, it shows that patient does not have coverage. Does coverage with other insurance found. Will submit new PA through alternate insurance.

## 2023-11-29 NOTE — Telephone Encounter (Signed)
 Pharmacy Patient Advocate Encounter   Received notification from Fax that prior authorization for INBRIJA  42MG  is required/requested.   Insurance verification completed.   The patient is insured through [facility] .   Per test claim: PA required; PA submitted to above mentioned insurance via CoverMyMeds Key/confirmation #/EOC [id number] Status is pending

## 2023-12-04 ENCOUNTER — Other Ambulatory Visit (HOSPITAL_COMMUNITY): Payer: Self-pay

## 2023-12-04 NOTE — Telephone Encounter (Signed)
 Pharmacy Patient Advocate Encounter  Received notification from [facility] that Prior Authorization for INBRIJA  42MG  has been APPROVED from 7.3.25 to 1.3.26. Ran test claim, Copay is $0. This test claim was processed through [facility]- copay amounts may vary at other pharmacies due to pharmacy/plan contracts, or as the patient moves through the different stages of their insurance plan.   PA #/Case ID/Reference #: [id number]

## 2023-12-10 ENCOUNTER — Other Ambulatory Visit: Payer: Self-pay | Admitting: Neurology

## 2023-12-10 DIAGNOSIS — R443 Hallucinations, unspecified: Secondary | ICD-10-CM

## 2023-12-10 DIAGNOSIS — G20A2 Parkinson's disease without dyskinesia, with fluctuations: Secondary | ICD-10-CM

## 2023-12-14 ENCOUNTER — Encounter: Payer: Self-pay | Admitting: Advanced Practice Midwife

## 2023-12-24 ENCOUNTER — Ambulatory Visit: Payer: Self-pay

## 2023-12-24 ENCOUNTER — Emergency Department
Admission: EM | Admit: 2023-12-24 | Discharge: 2023-12-24 | Disposition: A | Discharge status: Home / Self Care | Attending: Emergency Medicine | Admitting: Emergency Medicine

## 2023-12-24 ENCOUNTER — Other Ambulatory Visit: Payer: Self-pay

## 2023-12-24 DIAGNOSIS — G20A1 Parkinson's disease without dyskinesia, without mention of fluctuations: Secondary | ICD-10-CM | POA: Diagnosis not present

## 2023-12-24 DIAGNOSIS — R319 Hematuria, unspecified: Secondary | ICD-10-CM | POA: Diagnosis present

## 2023-12-24 DIAGNOSIS — N410 Acute prostatitis: Secondary | ICD-10-CM | POA: Insufficient documentation

## 2023-12-24 DIAGNOSIS — I10 Essential (primary) hypertension: Secondary | ICD-10-CM | POA: Insufficient documentation

## 2023-12-24 LAB — BASIC METABOLIC PANEL WITH GFR
Anion gap: 12 (ref 5–15)
BUN: 18 mg/dL (ref 8–23)
CO2: 27 mmol/L (ref 22–32)
Calcium: 9.4 mg/dL (ref 8.9–10.3)
Chloride: 103 mmol/L (ref 98–111)
Creatinine, Ser: 0.91 mg/dL (ref 0.61–1.24)
GFR, Estimated: >60 mL/min (ref >60)
Glucose, Bld: 98 mg/dL (ref 70–99)
Potassium: 3.6 mmol/L (ref 3.5–5.1)
Sodium: 142 mmol/L (ref 135–145)

## 2023-12-24 LAB — CBC
HCT: 42.4 % (ref 39.0–52.0)
Hemoglobin: 14.8 g/dL (ref 13.0–17.0)
MCH: 32.7 pg (ref 26.0–34.0)
MCHC: 34.9 g/dL (ref 30.0–36.0)
MCV: 93.8 fL (ref 80.0–100.0)
Platelets: 158 K/uL (ref 150–400)
RBC: 4.52 MIL/uL (ref 4.22–5.81)
RDW: 11.9 % (ref 11.5–15.5)
WBC: 6.1 K/uL (ref 4.0–10.5)
nRBC: 0 % (ref 0.0–0.2)

## 2023-12-24 LAB — URINALYSIS, ROUTINE W REFLEX MICROSCOPIC
Bilirubin Urine: NEGATIVE
Glucose, UA: NEGATIVE mg/dL
Hgb urine dipstick: NEGATIVE
Ketones, ur: 5 mg/dL — AB
Leukocytes,Ua: NEGATIVE
Nitrite: NEGATIVE
Protein, ur: NEGATIVE mg/dL
Specific Gravity, Urine: 1.017 (ref 1.005–1.030)
pH: 5 (ref 5.0–8.0)

## 2023-12-24 MED ORDER — SULFAMETHOXAZOLE-TRIMETHOPRIM 800-160 MG PO TABS: 1.0000 | ORAL_TABLET | Freq: Two times a day (BID) | ORAL | 0 refills | Status: AC

## 2023-12-24 MED ORDER — LEVOFLOXACIN 500 MG PO TABS
500.0000 mg | ORAL_TABLET | Freq: Once | ORAL | Status: AC
  Administered 2023-12-24: 500 mg via ORAL
  Filled 2023-12-24: qty 1

## 2023-12-24 NOTE — Telephone Encounter (Signed)
 Noted ---will await the ER evaluation. No doubt will include a CT scan

## 2023-12-24 NOTE — ED Triage Notes (Signed)
 Pt to ED via POV with wife for hematuria since about 3 days ago. Wife states blood was bright red and a lot yesterday. Pt denies dysuria. Urinates often at night. Lower back pain since about 1 month. Pt is slow to respond--has mild neurocognitive disorder and Parkinsons. Pt is daily drinker, 1/2 bottle Vodka each night. Last drink was last night.

## 2023-12-24 NOTE — Discharge Instructions (Signed)
 Follow-up with Dr. Jimmy.  Please call him for an appointment.  I am starting you on an antibiotic in case you have prostatitis or epididymitis associated with the hematuria.

## 2023-12-24 NOTE — ED Provider Notes (Signed)
 [facility] Provider Note    Event Date/Time   First MD Initiated Contact with Patient 12/24/23 1810     (approximate)   History   Hematuria   HPI  [name] [name] is a 63 y.o. male history of Parkinson's, hypertension, EtOH abuse emergency department with hematuria for 3 days.  States feels the toilet up with blood.  Some dysuria.  Denies fever.  Does state he has had some chills and shivers.  Denies cough, congestion, chest pain or shortness of breath.      Physical Exam   Triage Vital Signs: ED Triage Vitals  Encounter Vitals Group     BP 12/24/23 1632 (!) 161/90     Girls Systolic BP Percentile --      Girls Diastolic BP Percentile --      Boys Systolic BP Percentile --      Boys Diastolic BP Percentile --      Pulse Rate 12/24/23 1632 (!) 57     Resp 12/24/23 1632 20     Temp 12/24/23 1632 98.4 F (36.9 C)     Temp Source 12/24/23 1632 Oral     SpO2 12/24/23 1632 99 %     Weight 12/24/23 1631 240 lb (108.9 kg)     Height 12/24/23 1631 5' 11 (1.803 m)     Head Circumference --      Peak Flow --      Pain Score 12/24/23 1627 8     Pain Loc --      Pain Education --      Exclude from Growth Chart --     Most recent vital signs: Vitals:   12/24/23 1632 12/24/23 1843  BP: (!) 161/90 (!) 165/88  Pulse: (!) 57 (!) 58  Resp: 20 18  Temp: 98.4 F (36.9 C)   SpO2: 99% 99%     General: Awake, no distress.   CV:  Good peripheral perfusion.  Resp:  Normal effort.  Abd:  No distention.  Nontender Other:  Patient refused prostate exam   ED Results / Procedures / Treatments   Labs (all labs ordered are listed, but only abnormal results are displayed) Labs Reviewed  URINALYSIS, ROUTINE W REFLEX MICROSCOPIC - Abnormal; Notable for the following components:      Result Value   Color, Urine YELLOW (*)    APPearance CLEAR (*)    Ketones, ur 5 (*)    All other components within normal limits  BASIC METABOLIC PANEL WITH GFR  CBC      EKG     RADIOLOGY     PROCEDURES:   Procedures  Critical Care:  no Chief Complaint  Patient presents with   Hematuria      MEDICATIONS ORDERED IN ED: Medications  levofloxacin  (LEVAQUIN ) tablet 500 mg (500 mg Oral Given 12/24/23 1839)     IMPRESSION / MDM / ASSESSMENT AND PLAN / ED COURSE  I reviewed the triage vital signs and the nursing notes.                              Differential diagnosis includes, but is not limited to, hematuria, UTI, kidney stone, epididymitis, prostatitis  Patient's presentation is most consistent with acute illness / injury with system symptoms.   Patient's labs are reassuring  Patient is refusing prostate exam states he would rather have his regular doctor to do that.  Told him it would help  us  determine whether or not he has prostatitis or possibly epididymitis.  Since he is refusing this exam I will go ahead and treat him as if he has prostatitis as he has had some shivers and afraid he does have some infection possibly along the urinary tract that is not showing in his urinalysis.  He is in agreement with this treatment plan.  He was given Levaquin  500 mg p.o. while here in the ED.  However due to his possible QT prolongation with his other medication I will start him on Bactrim .  He is to follow-up with his regular doctor.  Return if worsening.  He was discharged stable condition.      FINAL CLINICAL IMPRESSION(S) / ED DIAGNOSES   Final diagnoses:  Prostatitis, acute     Rx / DC Orders   ED Discharge Orders          Ordered    sulfamethoxazole -trimethoprim  (BACTRIM  DS) 800-160 MG tablet  2 times daily        12/24/23 1831             Note:  This document was prepared using Dragon voice recognition software and may include unintentional dictation errors.    Gasper Devere ORN, PA-C 12/24/23 [name], MD 12/24/23 [phone]

## 2023-12-24 NOTE — Telephone Encounter (Signed)
 FYI Only or Action Required?: Action required by provider: update on patient condition.  Patient was last seen in primary care on 09/11/2023 by Jimmy Charlie FERNS, MD.  Called Nurse Triage reporting Hematuria.  Symptoms began several days ago.  Interventions attempted: Nothing.  Symptoms are: unchanged.  Triage Disposition: Go to ED Now (Notify PCP)  Patient/caregiver understands and will follow disposition?: Yes

## 2023-12-24 NOTE — Telephone Encounter (Signed)
 Reason for Disposition . Passing pure blood or large blood clots (i.e., size > a dime)  (Exception: Fleck or small strands.)  Answer Assessment - Initial Assessment Questions 1. COLOR of URINE: Describe the color of the urine.  (e.g., tea-colored, pink, red, bloody) Do you have blood clots in your urine? (e.g., none, pea, grape, small coin)     Bright red 2. ONSET: When did the bleeding start?      Several days  3. EPISODES: How many times has there been blood in the urine? or How many times today?     Not sure 4. PAIN with URINATION: Is there any pain with passing your urine? If Yes, ask: How bad is the pain?  (Scale 1-10; or mild, moderate, severe)     Not sure 5. FEVER: Do you have a fever? If Yes, ask: What is your temperature, how was it measured, and when did it start?     No sure 6. ASSOCIATED SYMPTOMS: Are you passing urine more frequently than usual?     yes 7. OTHER SYMPTOMS: Do you have any other symptoms? (e.g., back/flank pain, abdomen pain, vomiting) denies  Yesterday was worse- lots of blood in toilet. No clots.  This has been going on for several days but pt didn't share with wife. Pt has Parkinson's and 'seems off more than usual. RN advised ED and to call/make  follow up with PCP.  Protocols used: Urine - Blood In-A-AH

## 2023-12-25 LAB — URINE CULTURE: Culture: <10000 — AB

## 2024-01-04 ENCOUNTER — Ambulatory Visit: Admitting: Internal Medicine

## 2024-01-04 ENCOUNTER — Encounter: Payer: Self-pay | Admitting: Internal Medicine

## 2024-01-04 VITALS — BP 116/74 | HR 56 | Temp 97.8°F | Ht 71.0 in | Wt 233.0 lb

## 2024-01-04 DIAGNOSIS — R310 Gross hematuria: Secondary | ICD-10-CM | POA: Diagnosis not present

## 2024-01-04 NOTE — Assessment & Plan Note (Signed)
 Inexplicable that he had gross hematuria then a normal urinalysis in ER that night Still, he needs work up for this---just in case Finish the antibiotic Refer to urology

## 2024-01-04 NOTE — Progress Notes (Signed)
 Subjective:    Patient ID: [name] [name], male    DOB: 16-Jul-1960, 63 y.o.   MRN: [id number]  HPI Here with wife for ER follow up Was seen 7/28  Last Monday--the toilet was full of blood Happened with voiding Urinalysis was negative at ER---not even any blood Some degree of urgency--happening more Increasing nocturia No dysuria No fever  Some chronic back and shoulder pain  Got empiric septra  from ER Things seem to clear up--but then passed more blood 4 days ago  Current Outpatient Medications on File Prior to Visit  Medication Sig Dispense Refill   aspirin EC 81 MG tablet Take 81 mg by mouth daily.     carbidopa -levodopa  (SINEMET  CR) 50-200 MG tablet TAKE ONE TABLET BY MOUTH EVERY NIGHT AT BEDTIME 90 tablet 0   Carbidopa -Levodopa  ER (SINEMET  CR) 25-100 MG tablet controlled release TAKE TWO TABLETS BY MOUTH AT 6 AM/9:30 AM/1 PM/4:30 PM 720 tablet 0   Levodopa  (INBRIJA ) 42 MG CAPS Samples of this drug were given to the patient, quantity 1, Lot Number [id number] Exp date 04/29/2023 60 capsule 0   Levodopa  (INBRIJA ) 42 MG CAPS Samples of this drug were given to the patient, quantity 1, Lot Number [id number] Exp 1-27     Levodopa  (INBRIJA ) 42 MG CAPS You can inhale the capsules as needed up to 5 times per day, separated by 2 hour intervals.  Many patients use this right when the wake up to help with first morning on and then as needed during the day. 300 capsule 3   losartan -hydrochlorothiazide  (HYZAAR) 100-25 MG tablet TAKE ONE TABLET BY MOUTH DAILY. 90 tablet 3   Pimavanserin Tartrate  (NUPLAZID ) 34 MG CAPS Take 1 capsule (34 mg total) by mouth daily. 90 capsule 0   QUEtiapine  (SEROQUEL ) 25 MG tablet Take 2 tablets (50 mg total) by mouth 2 (two) times daily. 360 tablet 0   sulfamethoxazole -trimethoprim  (BACTRIM  DS) 800-160 MG tablet Take 1 tablet by mouth 2 (two) times daily for 14 days. 28 tablet 0   vitamin B-12 (CYANOCOBALAMIN ) 1000 MCG tablet Take 1,000 mcg by mouth daily.      [DISCONTINUED] potassium chloride  (K-DUR) 10 MEQ tablet Take 1 tablet (10 mEq total) by mouth 2 (two) times daily. 60 tablet 2   No current facility-administered medications on file prior to visit.    Allergies  Allergen Reactions   Hydrocodone Nausea Only    Past Medical History:  Diagnosis Date   Alcohol abuse 09/08/2019   Episodes of formed visual hallucinations    Essential hypertension, benign 01/31/2007   Herniated disc, cervical    History of skin cancer    basal cell; face and abdomen   Hyperlipidemia    Mild neurocognitive disorder with Lewy bodies, possible 05/03/2021   Parkinson's disease 04/27/2016   REM sleep behaviors    Syringomyelia 09/08/2019   Tremor of right hand 06/21/2015    Past Surgical History:  Procedure Laterality Date   BASAL CELL CARCINOMA EXCISION     EYE SURGERY     as a child   FINGER FRACTURE SURGERY     5th digit, left hand, x2   HERNIA REPAIR     age 33    Family History  Problem Relation Age of Onset   Hypertension Mother    Diabetes Father    Hypertension Father    Heart attack Father    Parkinson's disease Maternal Grandmother    Parkinsonism Maternal Grandmother     Social History  Socioeconomic History   Marital status: Married    Spouse name: Not on file   Number of children: 1   Years of education: 12   Highest education level: High school graduate  Occupational History   Occupation: Project manager--now disabled    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   Right handed   Two story home      No living will   Wife should be decision maker--daughter also   Would accept resuscitation   Not sure about feeding tube   Social Drivers of Health   Financial Resource Strain: Not on file  Food Insecurity: No Food Insecurity (10/03/2023)   Hunger Vital Sign    Worried About Running Out of Food in the Last Year: Never true    Ran Out of Food in the Last Year: Never true  Transportation Needs: No Transportation Needs (10/03/2023)   PRAPARE - Administrator, Civil Service (Medical): No    Lack of Transportation (Non-Medical): No  Physical Activity: Not on file  Stress: Not on file  Social Connections: Not on file  Intimate Partner Violence: Not At Risk (10/03/2023)   Humiliation, Afraid, Rape, and Kick questionnaire    Fear of Current or Ex-Partner: No    Emotionally Abused: No    Physically Abused: No    Sexually Abused: No   Review of Systems Got lost and fell in the woods Had to be taken out by squad on ATVs ER visit --lots of ticks, etc No N/V Eating per normal No history of kidney stones     Objective:   Physical Exam Constitutional:      Appearance: Normal appearance.  Abdominal:     Palpations: Abdomen is soft.     Tenderness: There is no abdominal tenderness. There is no guarding or rebound.  Neurological:     Mental Status: He is alert.            Assessment & Plan:

## 2024-02-08 NOTE — Progress Notes (Signed)
 Assessment/Plan:   1.  Parkinsons Disease  -continue carbidopa /levodopa  25/100 CR, 2 tablets at 6 AM/9:30 AM/1 PM/4:30 PM  -Continue carbidopa /levodopa  50/200 at bed  -Continue Inbrija  as needed.    -Pt and I discussed Vyalev.  Discussed r/b/se.  Patient has motor fluctuations, including dyskinesia and bradykinesia and has at least 2.5 hours of off time per day.  Patient could certainly benefit from 24 hour/day infusion therapy.  We will try to get approval.  -handicap placard filled out  - PT referral sent.   2.  Hallucinations             -On Nuplazid .    - For now, he will continue quetiapine  25 mg so that he takes 2 in the morning and 2 at bedtime   -Discussed risks of using nuplazid  and quietiapine together.  Discussed that these are both antipsychotics and the risks of increased in using them together, especially for increased QT.  Discussed what this meant.  They want to continue.  -QT/QTc normal in 10/2023   3.  Alcohol overuse             - Unfortunately, this continues to be an issue.  I have talked with the patient, his wife, and his brother-in-law about alcohol and the roles that plays into memory change, including alcohol related dementia.  We have also told them that cannot be stopped cold [place] and needs to be weaned slowly.  Pt continues to drink daily alcohol but they report that they are working on decreasing it.   4.  B12 deficiency             -Recommend oral B12 supplementation.   5.  Cervical stenosis with syringomyelia             -Really would like to repeat this MRI of the cervical spine.  He and I talked about that for a long time.  He declined.   6.  Cognitive impairment  -Dr. Richie did memory testing in December, 2022.  While that only revealed MCI, he was concerned with dementia with Lewy bodies.  While I really do not think he meets criteria for that as he did not have profound memory impairment early in the disease, and really has not had memory  interfere with social and occupational or ADLs (requirements for LBD), I do certainly think that he meets criteria now for moderate dementia.  - Long discussion, as above, with patient and wife that he needs 24 hour/day assistance and care.  All medicines need to be monitored.  Patient's wife is still working.  We discussed potential options including the [facility].  His wife stated that she would not put him in a daycare center.  We did refer them to [facility], but when contacted family stated that they did not need any services.  7.  Sialorrhea  - This has become more bothersome to the patient, and we discussed Xeomin and he would like to try to get authorization.  He is not candidate for anticholinergics given memory change, hallucinations, fall risk.  8.  Low back pain with possible lumbar radiculopathy  -He asked about pain medicine, but I do not want to do that without knowing what we are treating.  In addition, most pain medications will promote hallucinations. -Discussed physical therapy and sports medicine referral.  He would need home PT because he doesn't drive.  He will think about it.  He does not want either right now.  -  Discussed MRI lumbar spine but he declined.       Subjective:   [name] [name] was seen today.  My previous records were reviewed prior to todays visit as well as outside records available to me. Pt with wife who supplements hx. patient has struggled with hallucinations.  We have been talking for many years about trying to discontinue alcohol, which has been a challenge.  He continues to drink alcohol - wife reports 2 per day now.  Visual hallucinations persist.  There is paranoia - thinks wife has boyfriends.  Family and I also talked about 24-hour per day care.  They have cameras in the home but he is home alone in the day.  Self takes meds. We referred him to [facility] for support services and they contacted him that they denied any need for support  services.  He was in the emergency room June 4 after a fall.  He was out in the woods and his dog pulled away from him and he was trying to get the dog and tripped and fell and could not get himself up.  He ended up with many ticks on his body.  He also fell today in the closet backing up/turning around in his closet.  He has trouble getting up/down from the bed and getting in/out of bed.  Off time is noted, particularly at the end of the day.  He has 2-1/2 to 3 hours of off time per day.  Current prescribed movement disorder medications: Carbidopa /levodopa  25/100CR, 2 tablets at 6 AM/9:30 AM/1 PM/4:30 PM  Carbidopa /levodopa  50/200 CR at bedtime  Nuplazid  Seroquel , 25 mg, 2 in the morning, 2 at bedtime  PREVIOUS MEDICATIONS: pramipexole  (increased hallucinations); Inbrija ; quetiapine ; Nuplazid ; Rytary  (given samples, but patient does not remember if he tried them or not); carbidopa /levodopa  IR (stopped b/c of hallucinations)   PREVIOUS MEDICATIONS: pramipexole  (increased hallucinations)  ALLERGIES:   Allergies  Allergen Reactions   Hydrocodone Nausea Only    CURRENT MEDICATIONS:  Outpatient Encounter Medications as of 02/11/2024  Medication Sig   aspirin EC 81 MG tablet Take 81 mg by mouth daily.   carbidopa -levodopa  (SINEMET  CR) 50-200 MG tablet TAKE ONE TABLET BY MOUTH EVERY NIGHT AT BEDTIME   Carbidopa -Levodopa  ER (SINEMET  CR) 25-100 MG tablet controlled release TAKE TWO TABLETS BY MOUTH AT 6 AM/9:30 AM/1 PM/4:30 PM   Levodopa  (INBRIJA ) 42 MG CAPS Samples of this drug were given to the patient, quantity 1, Lot Number [id number] Exp date 04/29/2023   Levodopa  (INBRIJA ) 42 MG CAPS Samples of this drug were given to the patient, quantity 1, Lot Number [id number] Exp 1-27   Levodopa  (INBRIJA ) 42 MG CAPS You can inhale the capsules as needed up to 5 times per day, separated by 2 hour intervals.  Many patients use this right when the wake up to help with first morning on and then as needed  during the day. (Patient taking differently: as needed. You can inhale the capsules as needed up to 5 times per day, separated by 2 hour intervals.  Many patients use this right when the wake up to help with first morning on and then as needed during the day.)   losartan -hydrochlorothiazide  (HYZAAR) 100-25 MG tablet TAKE ONE TABLET BY MOUTH DAILY.   Pimavanserin Tartrate  (NUPLAZID ) 34 MG CAPS Take 1 capsule (34 mg total) by mouth daily.   QUEtiapine  (SEROQUEL ) 25 MG tablet Take 2 tablets (50 mg total) by mouth 2 (two) times daily.   vitamin B-12 (CYANOCOBALAMIN ) 1000 MCG  tablet Take 1,000 mcg by mouth daily.   [DISCONTINUED] potassium chloride  (K-DUR) 10 MEQ tablet Take 1 tablet (10 mEq total) by mouth 2 (two) times daily.   No facility-administered encounter medications on file as of 02/11/2024.    Objective:   PHYSICAL EXAMINATION:    VITALS:   Vitals:   02/11/24 1353  BP: 110/69  Pulse: 65  SpO2: 95%  Weight: 235 lb (106.6 kg)  Height: 5' 11 (1.803 m)     GEN:  The patient appears stated age and is in NAD. HEENT:  Normocephalic, atraumatic.   The mucous membranes are moist. The superficial temporal arteries are without ropiness or tenderness.  He does have some drooling. Cardiovascular: Regular rate rhythm Lungs: Clear to auscultation bilaterally Neck: No bruits  Neurological examination:  Orientation: The patient is alert and oriented x3.  He has psychomotor retardation and answers questions slowly, but most are accurate.  This is same as previous. Cranial nerves: There is good facial symmetry with significant facial hypomimia. The speech is fluent and clear.  He is hypophonic.  Soft palate rises symmetrically and there is no tongue deviation. Hearing is intact to conversational tone. Sensation: Sensation is intact to light touch throughout Motor: Strength is at least antigravity x4.  Movement examination: Tone: He has mild increased tone on the R.   Abnormal movements:  No significant tremor noted today; he has head dyskinesia, mild Coordination:  There is mild to mod decremation, R>L Gait and Station: Patient is forward flexed.  He festinate some and is a bit unstable.     I have reviewed and interpreted the following labs independently    Chemistry      Component Value Date/Time   NA 142 12/24/2023 1633   K 3.6 12/24/2023 1633   CL 103 12/24/2023 1633   CO2 27 12/24/2023 1633   BUN 18 12/24/2023 1633   CREATININE 0.91 12/24/2023 1633   CREATININE 0.91 08/14/2019 1534      Component Value Date/Time   CALCIUM 9.4 12/24/2023 1633   ALKPHOS 41 10/31/2023 2033   AST 26 10/31/2023 2033   ALT <5 10/31/2023 2033   BILITOT 1.1 10/31/2023 2033       Lab Results  Component Value Date   WBC 6.1 12/24/2023   HGB 14.8 12/24/2023   HCT 42.4 12/24/2023   MCV 93.8 12/24/2023   PLT 158 12/24/2023    Lab Results  Component Value Date   TSH 0.87 11/06/2022     Total time spent on today's visit was 60 minutes, including both face-to-face time and nonface-to-face time.  Time included that spent on review of records (prior notes available to me/labs/imaging if pertinent), discussing treatment and goals, answering patient's questions and coordinating care.  Cc:  Jimmy Charlie FERNS, MD

## 2024-02-11 ENCOUNTER — Ambulatory Visit (INDEPENDENT_AMBULATORY_CARE_PROVIDER_SITE_OTHER): Admitting: Neurology

## 2024-02-11 ENCOUNTER — Encounter

## 2024-02-11 ENCOUNTER — Encounter: Payer: Self-pay | Admitting: Neurology

## 2024-02-11 ENCOUNTER — Telehealth: Payer: Self-pay | Admitting: Neurology

## 2024-02-11 VITALS — BP 110/69 | HR 65 | Ht 71.0 in | Wt 235.0 lb

## 2024-02-11 DIAGNOSIS — R443 Hallucinations, unspecified: Secondary | ICD-10-CM

## 2024-02-11 DIAGNOSIS — G20B2 Parkinson's disease with dyskinesia, with fluctuations: Secondary | ICD-10-CM | POA: Diagnosis not present

## 2024-02-11 MED ORDER — CARBIDOPA-LEVODOPA ER 25-100 MG PO TBCR: EXTENDED_RELEASE_TABLET | ORAL | 1 refills | Status: DC

## 2024-02-11 MED ORDER — QUETIAPINE FUMARATE 25 MG PO TABS
50.0000 mg | ORAL_TABLET | Freq: Two times a day (BID) | ORAL | 1 refills | Status: AC
Start: 2024-02-11 — End: ?

## 2024-02-11 MED ORDER — CARBIDOPA-LEVODOPA ER 50-200 MG PO TBCR: 1.0000 | EXTENDED_RELEASE_TABLET | Freq: Every day | ORAL | 1 refills | Status: DC

## 2024-02-11 NOTE — Telephone Encounter (Signed)
 [facility] specialty pharmacy rep calling for refill of Rx Pimavanserin Tartrate  (NUPLAZID ) 34 MG CAPS  Phone number: [phone] Fax:[phone]

## 2024-02-12 ENCOUNTER — Other Ambulatory Visit: Payer: Self-pay

## 2024-02-12 DIAGNOSIS — R441 Visual hallucinations: Secondary | ICD-10-CM

## 2024-02-12 MED ORDER — NUPLAZID 34 MG PO CAPS
1.0000 | ORAL_CAPSULE | Freq: Every day | ORAL | 0 refills | Status: DC
Start: 2024-02-12 — End: 2024-04-21

## 2024-02-12 NOTE — Telephone Encounter (Signed)
 Rx has been sent by escribe

## 2024-02-13 ENCOUNTER — Ambulatory Visit: Admitting: Urology

## 2024-02-13 ENCOUNTER — Encounter: Payer: Self-pay | Admitting: Urology

## 2024-02-13 VITALS — BP 157/92 | HR 55 | Temp 97.8°F | Ht 71.0 in | Wt 235.0 lb

## 2024-02-13 DIAGNOSIS — R399 Unspecified symptoms and signs involving the genitourinary system: Secondary | ICD-10-CM | POA: Diagnosis not present

## 2024-02-13 DIAGNOSIS — R310 Gross hematuria: Secondary | ICD-10-CM | POA: Diagnosis not present

## 2024-02-13 LAB — URINALYSIS, COMPLETE
Bilirubin, UA: NEGATIVE
Glucose, UA: NEGATIVE
Ketones, UA: NEGATIVE
Leukocytes,UA: NEGATIVE
Nitrite, UA: NEGATIVE
Protein,UA: NEGATIVE
RBC, UA: NEGATIVE
Specific Gravity, UA: 1.02 (ref 1.005–1.030)
Urobilinogen, Ur: 0.2 mg/dL (ref 0.2–1.0)
pH, UA: 6 (ref 5.0–7.5)

## 2024-02-13 LAB — MICROSCOPIC EXAMINATION

## 2024-02-13 MED ORDER — SILODOSIN 8 MG PO CAPS
8.0000 mg | ORAL_CAPSULE | Freq: Every day | ORAL | 1 refills | Status: DC
Start: 2024-02-13 — End: 2024-04-18

## 2024-02-13 NOTE — Patient Instructions (Addendum)
 Scheduling number:  [phone]  Cystoscopy Cystoscopy is a procedure that is used to help diagnose and sometimes treat conditions that affect the lower urinary tract. The lower urinary tract includes the bladder and the urethra. The urethra is the tube that drains urine from the bladder. Cystoscopy is done using a thin, tube-shaped instrument with a light and camera at the end (cystoscope). The cystoscope may be hard or flexible, depending on the goal of the procedure. The cystoscope is inserted through the urethra, into the bladder. Cystoscopy may be recommended if you have: Urinary tract infections that keep coming back. Blood in the urine (hematuria). An inability to control when you urinate (urinary incontinence) or an overactive bladder. Unusual cells found in a urine sample. A blockage in the urethra, such as a urinary stone. Painful urination. An abnormality in the bladder found during an intravenous pyelogram (IVP) or CT scan. What are the risks? Generally, this is a safe procedure. However, problems may occur, including: Infection. Bleeding.  What happens during the procedure?  You will be given one or more of the following: A medicine to numb the area (local anesthetic). The area around the opening of your urethra will be cleaned. The cystoscope will be passed through your urethra into your bladder. Germ-free (sterile) fluid will flow through the cystoscope to fill your bladder. The fluid will stretch your bladder so that your health care provider can clearly examine your bladder walls. Your doctor will look at the urethra and bladder. The cystoscope will be removed The procedure may vary among health care providers  What can I expect after the procedure? After the procedure, it is common to have: Some soreness or pain in your urethra. Urinary symptoms. These include: Mild pain or burning when you urinate. Pain should stop within a few minutes after you urinate. This may  last for up to a few days after the procedure. A small amount of blood in your urine for several days. Feeling like you need to urinate but producing only a small amount of urine. Follow these instructions at home: General instructions Return to your normal activities as told by your health care provider.  Drink plenty of fluids after the procedure. Keep all follow-up visits as told by your health care provider. This is important. Contact a health care provider if you: Have pain that gets worse or does not get better with medicine, especially pain when you urinate lasting longer than 72 hours after the procedure. Have trouble urinating. Get help right away if you: Have blood clots in your urine. Have a fever or chills. Are unable to urinate. Summary Cystoscopy is a procedure that is used to help diagnose and sometimes treat conditions that affect the lower urinary tract. Cystoscopy is done using a thin, tube-shaped instrument with a light and camera at the end. After the procedure, it is common to have some soreness or pain in your urethra. It is normal to have blood in your urine after the procedure.  If you were prescribed an antibiotic medicine, take it as told by your health care provider.  This information is not intended to replace advice given to you by your health care provider. Make sure you discuss any questions you have with your health care provider. Document Revised: 05/07/2018 Document Reviewed: 05/07/2018 Elsevier Patient Education  2020 [employer].

## 2024-02-13 NOTE — Progress Notes (Signed)
 02/13/2024 3:07 PM   [name] [name] 10-05-1960 [id number]  Referring provider: Jimmy Charlie FERNS, MD No address on file  Chief Complaint  Patient presents with   Hematuria    HPI: [name] [name] is a 63 y.o. fe male referred for evaluation and management of hematuria  ED visit 12/24/2023 with a 3-day history of total gross hematuria.  Mild dysuria.  UA in ED was dipstick negative, microscopy not performed.  Was started empirically on Levaquin .  Urine culture with insignificant growth Urinalysis: As above Gross hematuria: As above Associated lower urinary tract symptoms: None Baseline lower urinary tract symptoms: Urinary frequency, urgency with urge incontinence.  + Parkinson's Pain: Denied flank, abdominal or pelvic pain Prior urologic history: None Blood thinners/antiplatelet meds: Negative Tobacco history: None   PMH: Past Medical History:  Diagnosis Date   Alcohol abuse 09/08/2019   Episodes of formed visual hallucinations    Essential hypertension, benign 01/31/2007   Herniated disc, cervical    History of skin cancer    basal cell; face and abdomen   Hyperlipidemia    Mild neurocognitive disorder with Lewy bodies, possible 05/03/2021   Parkinson's disease 04/27/2016   REM sleep behaviors    Syringomyelia 09/08/2019   Tremor of right hand 06/21/2015    Surgical History: Past Surgical History:  Procedure Laterality Date   BASAL CELL CARCINOMA EXCISION     EYE SURGERY     as a child   FINGER FRACTURE SURGERY     5th digit, left hand, x2   HERNIA REPAIR     age 36    Home Medications:  Allergies as of 02/13/2024       Reactions   Hydrocodone Nausea Only        Medication List        Accurate as of February 13, 2024  3:07 PM. If you have any questions, ask your nurse or doctor.          aspirin EC 81 MG tablet Take 81 mg by mouth daily.   carbidopa -levodopa  50-200 MG tablet Commonly known as: SINEMET  CR Take 1 tablet by mouth at  bedtime.   Carbidopa -Levodopa  ER 25-100 MG tablet controlled release Commonly known as: SINEMET  CR TAKE TWO TABLETS BY MOUTH AT 6 AM/9:30 AM/1 PM/4:30 PM   cyanocobalamin  1000 MCG tablet Commonly known as: VITAMIN B12 Take 1,000 mcg by mouth daily.   Inbrija  42 MG Caps Generic drug: Levodopa  Samples of this drug were given to the patient, quantity 1, Lot Number [id number] Exp date 04/29/2023 What changed: Another medication with the same name was changed. Make sure you understand how and when to take each.   Inbrija  42 MG Caps Generic drug: Levodopa  Samples of this drug were given to the patient, quantity 1, Lot Number [id number] Exp 1-27 What changed: Another medication with the same name was changed. Make sure you understand how and when to take each.   Inbrija  42 MG Caps Generic drug: Levodopa  You can inhale the capsules as needed up to 5 times per day, separated by 2 hour intervals.  Many patients use this right when the wake up to help with first morning on and then as needed during the day. What changed:  when to take this reasons to take this   losartan -hydrochlorothiazide  100-25 MG tablet Commonly known as: HYZAAR TAKE ONE TABLET BY MOUTH DAILY.   Nuplazid  34 MG Caps Generic drug: Pimavanserin Tartrate  Take 1 capsule (34 mg total) by mouth daily.  QUEtiapine  25 MG tablet Commonly known as: SEROQUEL  Take 2 tablets (50 mg total) by mouth 2 (two) times daily.        Allergies:  Allergies  Allergen Reactions   Hydrocodone Nausea Only    Family History: Family History  Problem Relation Age of Onset   Hypertension Mother    Diabetes Father    Hypertension Father    Heart attack Father    Parkinson's disease Maternal Grandmother    Parkinsonism Maternal Grandmother     Social History:  reports that he has never smoked. He has been exposed to tobacco smoke. He has quit using smokeless tobacco. He reports current alcohol use. He reports that he does not  use drugs.   Physical Exam: BP (!) 157/92   Pulse (!) 55   Temp 97.8 F (36.6 C) (Oral)   Ht 5' 11 (1.803 m)   Wt 235 lb (106.6 kg)   SpO2 95%   BMI 32.78 kg/m   Constitutional:  Alert and oriented, No acute distress. HEENT: [place] AT. Respiratory: Normal respiratory effort, no increased work of breathing. GI: Abdomen is soft, nontender, nondistended, no abdominal masses Psychiatric: Normal mood and affect.  Laboratory Data:  Urinalysis Microscopy negative     Assessment & Plan:    1.  Gross hematuria AUA risk stratification: High We discussed the recommended evaluation of high risk hematuria which consist of CT urogram and cystoscopy.  The procedures were discussed in detail and he/she has elected to proceed with further evaluation All questions were answered CTU order placed and cystoscopy was scheduled  2.  Lower urinary tract symptoms We discussed potential causes including BPH and Parkinson's Initial trial of silodosin  8 mg daily.  If no improvement will add a beta 3 agonist   Jovonne Wilton C Ravin Denardo, MD  [facility] [address] [place], [place] [address] [phone]

## 2024-02-14 ENCOUNTER — Other Ambulatory Visit (HOSPITAL_COMMUNITY): Payer: Self-pay

## 2024-02-14 ENCOUNTER — Telehealth: Payer: Self-pay

## 2024-02-14 NOTE — Telephone Encounter (Signed)
 Patient needs a new PA for Nuplazid  34 mg 1 capsule daily please [facility] called to ask for one thank you

## 2024-02-14 NOTE — Telephone Encounter (Signed)
 Pharmacy Patient Advocate Encounter   Received notification from Pt Calls Messages that prior authorization for NUPLAZID  34MG  is required/requested.   Insurance verification completed.   The patient is insured through [facility] .   Per test claim: PA required; PA submitted to above mentioned insurance via Latent Key/confirmation #/EOC [id number] Status is pending

## 2024-02-15 ENCOUNTER — Ambulatory Visit: Admitting: Neurology

## 2024-02-16 ENCOUNTER — Encounter: Payer: Self-pay | Admitting: Urology

## 2024-02-18 ENCOUNTER — Other Ambulatory Visit (HOSPITAL_COMMUNITY): Payer: Self-pay

## 2024-02-18 NOTE — Telephone Encounter (Signed)
 Pharmacy Patient Advocate Encounter  Received notification from [employer] that Prior Authorization for NUPLAZID  34MG  has been APPROVED from 9.18.25 to 12.31.25. Unable to obtain price due to refill too soon rejection, last fill date 9.19.25 next available fill date10.12.25   PA #/Case ID/Reference #: [id number]

## 2024-02-19 ENCOUNTER — Telehealth: Payer: Self-pay | Admitting: Neurology

## 2024-02-19 NOTE — Telephone Encounter (Signed)
 Patient needs to speak to someone about getting help with the medication Program he was not sure of the medication name

## 2024-02-20 NOTE — Telephone Encounter (Signed)
 Called patients wife and now that patient is on Medicare he has a 2000$ deductible for his Nuplazid . They have paid it this time but will be unable to pay that amount next time. Call [facility] to ask about this

## 2024-03-26 ENCOUNTER — Encounter: Payer: Self-pay | Admitting: Urology

## 2024-03-26 ENCOUNTER — Ambulatory Visit: Admitting: Urology

## 2024-03-26 VITALS — BP 169/82 | HR 80 | Ht 71.0 in | Wt 230.0 lb

## 2024-03-26 DIAGNOSIS — R310 Gross hematuria: Secondary | ICD-10-CM

## 2024-03-26 LAB — URINALYSIS, COMPLETE
Bilirubin, UA: NEGATIVE
Glucose, UA: NEGATIVE
Ketones, UA: NEGATIVE
Leukocytes,UA: NEGATIVE
Nitrite, UA: NEGATIVE
Protein,UA: NEGATIVE
RBC, UA: NEGATIVE
Specific Gravity, UA: 1.025 (ref 1.005–1.030)
Urobilinogen, Ur: 0.2 mg/dL (ref 0.2–1.0)
pH, UA: 6 (ref 5.0–7.5)

## 2024-03-26 LAB — MICROSCOPIC EXAMINATION

## 2024-03-26 MED ORDER — GEMTESA 75 MG PO TABS: 75.0000 mg | ORAL_TABLET | Freq: Every day | ORAL | Status: DC

## 2024-03-26 NOTE — Progress Notes (Signed)
   03/26/24  CC:  Chief Complaint  Patient presents with   Cysto    HPI: Refer to my prior office note 02/13/2024.  Recurrent episode gross hematuria 1.5 weeks ago.  CT urogram has not been performed-states they were not contacted by radiology to schedule.  UA today dipstick/microscopy negative.  No significant improvement in his voiding symptoms with silodosin .  Blood pressure (!) 169/82, pulse 80, height 5' 11 (1.803 m), weight 230 lb (104.3 kg). NED. A&Ox3.   No respiratory distress   Abd soft, NT, ND Normal phallus with bilateral descended testicles  Cystoscopy Procedure Note  Patient identification was confirmed, informed consent was obtained, and patient was prepped using Betadine solution.  Lidocaine  jelly was administered per urethral meatus.     Pre-Procedure: - Inspection reveals a normal caliber urethral meatus.  Procedure: The flexible cystoscope was introduced without difficulty - No urethral strictures/lesions are present. - Moderate lateral lobe enlargement prostate  - Normal bladder neck - Bilateral ureteral orifices identified - Bladder mucosa  reveals no ulcers, tumors, or lesions - No bladder stones - No trabeculation  Retroflexion shows no intravesical median lobe or mucosal abnormalities   Post-Procedure: - Patient tolerated the procedure well  Assessment/ Plan: No lower tract abnormalities noted on cystoscopy They were given the central scheduling number to schedule CT urogram Urine cytology sent Add Gemtesa 75 mg daily-samples given x 4 weeks and they will call back regarding efficacy    Glendia JAYSON Barba, MD

## 2024-04-02 LAB — CYTOLOGY PLUS MONITORING PROFILE: PAP & FEULGEN

## 2024-04-03 ENCOUNTER — Ambulatory Visit: Payer: Self-pay | Admitting: Urology

## 2024-04-13 ENCOUNTER — Telehealth: Payer: Self-pay | Admitting: Urology

## 2024-04-13 NOTE — Telephone Encounter (Signed)
 Urine cytology showed no abnormal cells, CT hematuria study needs to be scheduled

## 2024-04-14 NOTE — Telephone Encounter (Signed)
 Notified patient as instructed wife per DPR , Wife will call and get ct on the books.

## 2024-04-16 ENCOUNTER — Telehealth: Payer: Self-pay | Admitting: Neurology

## 2024-04-16 NOTE — Telephone Encounter (Signed)
 Pt called informed that Dr Tat said he can take his morning medication.

## 2024-04-16 NOTE — Patient Instructions (Signed)
 STOP all oral levodopa  that you were previously on. If you were previously on entacapone/opicapone, you will stop that. You will continue your other Parkinsons Disease medication, unless otherwise directed to do so (if you are on other Parkinsons Disease meds)  Congratulations!  You have gotten started on the Vyalev pump.   VYALEV [employer] also offers a 24/7 Hotline with trained nurse support to provide you with tips, troubleshooting advice, and answers to questions.  You may call [phone] with questions or concerns about your device.  If you have forgotten the steps you do to prepare yourself and prime and change your syringe, there is a video you can watch to assist if you would like.   You can go to:  [email or web address]  Please remember that the most important thing is hygiene!  Keep the area CLEAN.  Even if you don't shower every day, we want you to wash your abdomen with soap and water, followed by cleaning the area with the alcohol pad before you place the cannula.  Also remember that we want you to change your site every 24 hours.  Some of the nurses (who are great) may give you other directions based on FDA studies, but we want you to change the site daily to decrease your risk of infection.  If you don't have access to a computer to watch the above video, here is a summary of that video:  Before you administer VYALEV, you should be shown how to do it correctly by a healthcare provider.   VYALEV is a prescription medicine used for treatment of advanced Parkinson's disease in adults. VYALEV contains two medicines, foscarbidopa and foslevodopa.  Here is a quick review of the steps needed to set up your pump:  Phase 1: Plan It Out  Wash your hands thoroughly Grab your medication vial. If it was refrigerated, let it sit at room temperature for 30 minutes Clean your workspace Gather and inspect your supplies Wash your hands thoroughly a  second time (VO) JANE: The second phase is "Product Manager."  Phase 2: Prepare the Syringe  Use the vial adapter to transfer medication from the vial to the syringe Connect the tubing to the syringe Place the filled syringe in your pump. Follow the on-screen prompts to confirm the syringe is in   Phase 3: Prime the Tubing Let's review phase 3: Prime the Tubing  Lay the site connector on a new paper towel Use your pump to prime. You'll do this whenever you use new tubing   Phase 4: Place the Cannula  Choose an infusion site on your belly Prepare the site Place the cannula with the cannula insertion device Attach the tubing to the cannula on your body  Phase 5: Pump the Medication  Follow the screen's prompts to start your pump Check the screen to confirm when the syringe is to be replaced Place your pump in its carrying case SUPER: How to stop and restart your pump

## 2024-04-16 NOTE — Telephone Encounter (Signed)
 Wife lvm stating that [name] [name] has an appt tomorrow with Tat for Pump adjustment and to learn how to use it. She wanted to know if meds needed to be taken before they cometo appt or would they need to wait after. She stated that he takes meds 4x a day. She is requesting a call back.   PH: [phone]

## 2024-04-16 NOTE — Progress Notes (Unsigned)
 Assessment/Plan:   1.  Parkinsons Disease  - Stop all oral levodopa   - Start Vyalev.  Initiated in the office today.  -Continue Inbrija  as needed.    2.  Hallucinations             -On Nuplazid .    - For now, he will continue quetiapine  25 mg so that he takes 2 in the morning and 2 at bedtime   -Discussed risks of using nuplazid  and quietiapine together.  Discussed that these are both antipsychotics and the risks of increased in using them together, especially for increased QT.  Discussed what this meant.  They want to continue.  -QT/QTc normal in 10/2023   3.  Alcohol overuse             - Unfortunately, this continues to be an issue.  I have talked with the patient, his wife, and his brother-in-law about alcohol and the roles that plays into memory change, including alcohol related dementia.  We have also told them that cannot be stopped cold turkey and needs to be weaned slowly.  Pt continues to drink daily alcohol but they report that they are working on decreasing it.   4.  B12 deficiency             -Recommend oral B12 supplementation.   5.  Cervical stenosis with syringomyelia             -Really would like to repeat this MRI of the cervical spine.  Patient declines.   6.  Cognitive impairment  -Dr. Richie did memory testing in December, 2022.  While that only revealed MCI, he was concerned with dementia with Lewy bodies.  While I really do not think he meets criteria for that as he did not have profound memory impairment early in the disease, and really has not had memory interfere with social and occupational or ADLs (requirements for LBD), I do certainly think that he meets criteria now for moderate dementia.  - Patient does need 24 hour/day assistance/care with monitoring of medications.  7.  Sialorrhea  - This has become more bothersome to the patient, and we discussed Xeomin and he would like to try to get authorization.  He is not candidate for anticholinergics given  memory change, hallucinations, fall risk.  8.  Low back pain with possible lumbar radiculopathy  -He asked about pain medicine, but I do not want to do that without knowing what we are treating.  In addition, most pain medications will promote hallucinations. -Discussed physical therapy and sports medicine referral.  He would need home PT because he doesn't drive.  He will think about it.  He does not want either right now.  -Discussed MRI lumbar spine but he declined.       Subjective:   [name] [name] was seen today.  My previous records were reviewed prior to todays visit as well as outside records available to me. Pt with wife who supplements hx. presents today for initiation of Vyalev.  He does come in today due for next dose of levodopa  (took last dose at 9 AM and came in around 12:30 PM).  He is stiff and slow, but able to ambulate.  Current prescribed movement disorder medications: Carbidopa /levodopa  25/100CR, 2 tablets at 6 AM/9:30 AM/1 PM/4:30 PM  Carbidopa /levodopa  50/200 CR at bedtime  Nuplazid  Seroquel , 25 mg, 2 in the morning, 2 at bedtime  PREVIOUS MEDICATIONS: pramipexole  (increased hallucinations); Inbrija ; quetiapine ; Nuplazid ; Rytary  (given samples, but  patient does not remember if he tried them or not); carbidopa /levodopa  IR (stopped b/c of hallucinations)   PREVIOUS MEDICATIONS: pramipexole  (increased hallucinations)  ALLERGIES:   Allergies  Allergen Reactions   Hydrocodone Nausea Only    CURRENT MEDICATIONS:  Outpatient Encounter Medications as of 04/17/2024  Medication Sig   aspirin EC 81 MG tablet Take 81 mg by mouth daily.   Levodopa  (INBRIJA ) 42 MG CAPS Samples of this drug were given to the patient, quantity 1, Lot Number [id number] Exp date 04/29/2023   Levodopa  (INBRIJA ) 42 MG CAPS Samples of this drug were given to the patient, quantity 1, Lot Number [id number] Exp 1-27   Levodopa  (INBRIJA ) 42 MG CAPS You can inhale the capsules as needed up to 5  times per day, separated by 2 hour intervals.  Many patients use this right when the wake up to help with first morning on and then as needed during the day. (Patient taking differently: as needed. You can inhale the capsules as needed up to 5 times per day, separated by 2 hour intervals.  Many patients use this right when the wake up to help with first morning on and then as needed during the day.)   losartan -hydrochlorothiazide  (HYZAAR) 100-25 MG tablet TAKE ONE TABLET BY MOUTH DAILY.   Pimavanserin Tartrate  (NUPLAZID ) 34 MG CAPS Take 1 capsule (34 mg total) by mouth daily.   QUEtiapine  (SEROQUEL ) 25 MG tablet Take 2 tablets (50 mg total) by mouth 2 (two) times daily.   silodosin  (RAPAFLO ) 8 MG CAPS capsule Take 1 capsule (8 mg total) by mouth daily with breakfast.   Vibegron (GEMTESA) 75 MG TABS Take 1 tablet (75 mg total) by mouth daily.   vitamin B-12 (CYANOCOBALAMIN ) 1000 MCG tablet Take 1,000 mcg by mouth daily.   [DISCONTINUED] carbidopa -levodopa  (SINEMET  CR) 50-200 MG tablet Take 1 tablet by mouth at bedtime.   [DISCONTINUED] Carbidopa -Levodopa  ER (SINEMET  CR) 25-100 MG tablet controlled release TAKE TWO TABLETS BY MOUTH AT 6 AM/9:30 AM/1 PM/4:30 PM   [DISCONTINUED] potassium chloride  (K-DUR) 10 MEQ tablet Take 1 tablet (10 mEq total) by mouth 2 (two) times daily.   No facility-administered encounter medications on file as of 04/17/2024.    Objective:   PHYSICAL EXAMINATION:    VITALS:   Vitals:   04/17/24 1219  BP: 130/80  Pulse: 66  Resp: 20  SpO2: 99%    GEN:  The patient appears stated age and is in NAD. HEENT:  Normocephalic, atraumatic.   The mucous membranes are moist. The superficial temporal arteries are without ropiness or tenderness.  He does have some drooling.  Neurological examination:  Orientation: The patient is alert and oriented x3.  He has psychomotor retardation and answers questions slowly, but most are accurate.  This is same as previous. Cranial  nerves: There is good facial symmetry with significant facial hypomimia. The speech is fluent and clear.  He is hypophonic.  Soft palate rises symmetrically and there is no tongue deviation. Hearing is intact to conversational tone. Sensation: Sensation is intact to light touch throughout Motor: Strength is at least antigravity x4.  Movement examination: Tone: Prior to [facility] initiation, he has moderate increased tone on the left and mild to moderate increased tone on the right.  This was markedly improved following loading dose and infusion of Vyalev. Abnormal movements: No significant dyskinesia today. Coordination:  There is  mod decremation, R>L prior to the initiation of Vyalev.  Post initiation, there is improvement. Gait and Station: Patient is forward  flexed.  He festinate some and is a bit unstable.  His walking is about the same pre and post initiation of Vyalev.   I have reviewed and interpreted the following labs independently    Chemistry      Component Value Date/Time   NA 142 12/24/2023 1633   K 3.6 12/24/2023 1633   CL 103 12/24/2023 1633   CO2 27 12/24/2023 1633   BUN 18 12/24/2023 1633   CREATININE 0.91 12/24/2023 1633   CREATININE 0.91 08/14/2019 1534      Component Value Date/Time   CALCIUM 9.4 12/24/2023 1633   ALKPHOS 41 10/31/2023 2033   AST 26 10/31/2023 2033   ALT <5 10/31/2023 2033   BILITOT 1.1 10/31/2023 2033       Lab Results  Component Value Date   WBC 6.1 12/24/2023   HGB 14.8 12/24/2023   HCT 42.4 12/24/2023   MCV 93.8 12/24/2023   PLT 158 12/24/2023    Lab Results  Component Value Date   TSH 0.87 11/06/2022     Total time spent on today's visit was 90 minutes, including both face-to-face time and nonface-to-face time.  Time included that spent on review of records (prior notes available to me/labs/imaging if pertinent), discussing treatment and goals, answering patient's questions and coordinating care.  The large majority of the visit  was spent in patient education with patient/wife on how to use Vyalev and pump questions  Cc:  No primary care provider on file.

## 2024-04-17 ENCOUNTER — Encounter: Payer: Self-pay | Admitting: Neurology

## 2024-04-17 ENCOUNTER — Ambulatory Visit: Admitting: Neurology

## 2024-04-17 ENCOUNTER — Other Ambulatory Visit: Payer: Self-pay | Admitting: Urology

## 2024-04-17 VITALS — BP 130/80 | HR 66 | Resp 20

## 2024-04-17 DIAGNOSIS — G20B2 Parkinson's disease with dyskinesia, with fluctuations: Secondary | ICD-10-CM

## 2024-04-17 DIAGNOSIS — R441 Visual hallucinations: Secondary | ICD-10-CM | POA: Diagnosis not present

## 2024-04-17 DIAGNOSIS — R413 Other amnesia: Secondary | ICD-10-CM | POA: Diagnosis not present

## 2024-04-17 NOTE — Procedures (Signed)
 Vyalev SQ was administered in the office today. - Loading dose: 0.8 mL on day 1 of implementation and if off infusion for > 3 hours (loading dose was given today as part of infusion) - Base rate: 0.25 mL/h  - Low rate: 0.23 mL/h  - High rate: 0.28 mL/h  - Extra dose:: 0.15 mL   The following were discussed with the patient:  - Administer in the abdomen, avoiding a 5-cm radius area from the navel. - Rotate the infusion site and use a new infusion set daily - Administer using the patient own Vyafuser  pump and consumables provided by the company - The medication vials are for single use only. - Discard the syringe and any unused med in the syringe after it has been in the syringe for 24 hours.  Total office infusion time:  60 min

## 2024-04-18 ENCOUNTER — Telehealth: Payer: Self-pay

## 2024-04-18 NOTE — Telephone Encounter (Signed)
 Spoke with [name] [name] she stated that [name] [name] said he is feeling better this morning he said that he thinks the pump is going to be a good thing.  [name] [name] was also informed that I spoke with [name] [name] and that in their book of information under travel they have 2 TSA cards. She said that when she gets home she will look,

## 2024-04-21 ENCOUNTER — Other Ambulatory Visit: Payer: Self-pay | Admitting: Neurology

## 2024-04-21 DIAGNOSIS — R441 Visual hallucinations: Secondary | ICD-10-CM

## 2024-04-28 ENCOUNTER — Telehealth: Payer: Self-pay | Admitting: Neurology
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
## 2024-04-28 NOTE — Telephone Encounter (Signed)
 Pt's wife lefted a message stating that pt is having issues with the new prescription. Please call. Thanks

## 2024-04-29 NOTE — Telephone Encounter (Signed)
 Called aptietns wife and he is doing better she feels on the lower dose. I did tell her everything from Dr. Martie recommendations.

## 2024-05-01 ENCOUNTER — Telehealth: Payer: Self-pay

## 2024-05-01 NOTE — Telephone Encounter (Signed)
 Called and left voicemail message.

## 2024-05-01 NOTE — Telephone Encounter (Signed)
 Patients wife called in today to let us  know that patient is still hallucinations and patients wife wanting to know if there is anything that can be done now

## 2024-05-01 NOTE — Telephone Encounter (Signed)
 Pt's wife called, Pt still having Rx issue and would like a call bk to discuss

## 2024-05-02 NOTE — Telephone Encounter (Signed)
 Called patients wife his ambulation is worse he is slower shaking badly freezing Hallucinations much worse with pump

## 2024-05-05 ENCOUNTER — Telehealth: Payer: Self-pay | Admitting: Neurology

## 2024-05-05 NOTE — Telephone Encounter (Signed)
 Patient wife called and asked to speak to [facility], [place], she wants to speak to her about patient having Hallucinations with the pump. Please call

## 2024-05-05 NOTE — Telephone Encounter (Signed)
 Pt got worse after going to the low dose, wife said hallucinations are 24 hours a day he wis waking her up at 3 am seeing things, wife is asking for a med check to see if is medications are working together or against each other, she said this has been horrible since he has been on the pump. And they can't keep going like this,

## 2024-05-06 ENCOUNTER — Other Ambulatory Visit: Payer: Self-pay

## 2024-05-06 NOTE — Telephone Encounter (Signed)
Pt wife called no answer left a voice mail to call the office back

## 2024-05-06 NOTE — Telephone Encounter (Signed)
 Patients wife does see a difference in patient being on pump they do not want to quit and they feel maybe it is some of the other meds like Nuplazid  and Seoquel 2 tabs BID. That one of these meds could be causing the hallucinations. He is coming in next Wednesday

## 2024-05-06 NOTE — Telephone Encounter (Signed)
 Wife called back wanting to speak with [facility].  PH: [phone]

## 2024-05-11 ENCOUNTER — Telehealth: Payer: Self-pay | Admitting: Urology

## 2024-05-11 NOTE — Telephone Encounter (Signed)
 Has not scheduled CT Hematuria study

## 2024-05-12 NOTE — Progress Notes (Unsigned)
 Assessment/Plan:   1.  Parkinsons Disease  - Stop all oral levodopa   - Start Vyalev.  Initiated in the office today.  -Continue Inbrija  as needed.    2.  Hallucinations             -On Nuplazid .    - For now, he will continue quetiapine  25 mg so that he takes 2 in the morning and 2 at bedtime   -Discussed risks of using nuplazid  and quietiapine together.  Discussed that these are both antipsychotics and the risks of increased in using them together, especially for increased QT.  Discussed what this meant.  They want to continue.  -QT/QTc normal in 10/2023   3.  Alcohol overuse             - Unfortunately, this continues to be an issue.  I have talked with the patient, his wife, and his brother-in-law about alcohol and the roles that plays into memory change, including alcohol related dementia.  We have also told them that cannot be stopped cold turkey and needs to be weaned slowly.  Pt continues to drink daily alcohol but they report that they are working on decreasing it.   4.  B12 deficiency             -Recommend oral B12 supplementation.   5.  Cervical stenosis with syringomyelia             -Really would like to repeat this MRI of the cervical spine.  Patient declines.   6.  Cognitive impairment  -Dr. Richie did memory testing in December, 2022.  While that only revealed MCI, he was concerned with dementia with Lewy bodies.  While I really do not think he meets criteria for that as he did not have profound memory impairment early in the disease, and really has not had memory interfere with social and occupational or ADLs (requirements for LBD), I do certainly think that he meets criteria now for moderate dementia.  - Patient does need 24 hour/day assistance/care with monitoring of medications.  7.  Sialorrhea  - This has become more bothersome to the patient, and we discussed Xeomin and he would like to try to get authorization.  He is not candidate for anticholinergics given  memory change, hallucinations, fall risk.  8.  Low back pain with possible lumbar radiculopathy  -He asked about pain medicine, but I do not want to do that without knowing what we are treating.  In addition, most pain medications will promote hallucinations. -Discussed physical therapy and sports medicine referral.  He would need home PT because he doesn't drive.  He will think about it.  He does not want either right now.  -Discussed MRI lumbar spine but he declined.       Subjective:   [name] [name] was seen today.  My previous records were reviewed prior to todays visit as well as outside records available to me. Pt with wife who supplements hx. last visit, Vyalev was initiated.  We have gotten several phone calls about him since our last visit.  They felt hallucinations had picked up into the patient was becoming aggressive.  I had them decrease his pump rate to the low rate and told them not to give any extra dosages.  I also told them to make sure that no oral levodopa  was available to him and make sure that the environment was safe (no guns, knives, etc.).  Also wanted to make sure that  there was 24 hour/day care and told them to call 911 if safety was an issue.  He did better in terms of behavior, but still seem to have hallucinations.  He also seemed to be moving slower.  Ultimately, I asked them to stop the pump for his weekend and return to his oral medication and I would call them the following Monday.  However, they did not want to do that and wanted to continue the pump.  Current movement disorder medications: Vyalev pump Nuplazid  Seroquel , 25 mg, 2 in the morning, 2 at bedtime  Levodopa  dosage prior to initiation of Vyalev: Carbidopa /levodopa  25/100CR, 2 tablets at 6 AM/9:30 AM/1 PM/4:30 PM  Carbidopa /levodopa  50/200 CR at bedtime   PREVIOUS MEDICATIONS: pramipexole  (increased hallucinations); Inbrija ; quetiapine ; Nuplazid ; Rytary  (given samples, but patient does not  remember if he tried them or not); carbidopa /levodopa  IR (stopped b/c of hallucinations)   PREVIOUS MEDICATIONS: pramipexole  (increased hallucinations)  ALLERGIES:   Allergies  Allergen Reactions   Hydrocodone Nausea Only    CURRENT MEDICATIONS:  Outpatient Encounter Medications as of 05/14/2024  Medication Sig   aspirin EC 81 MG tablet Take 81 mg by mouth daily.   losartan -hydrochlorothiazide  (HYZAAR) 100-25 MG tablet TAKE ONE TABLET BY MOUTH DAILY.   Pimavanserin Tartrate  (NUPLAZID ) 34 MG CAPS TAKE 1 CAPSULE BY MOUTH 1 TIME A DAY   QUEtiapine  (SEROQUEL ) 25 MG tablet Take 2 tablets (50 mg total) by mouth 2 (two) times daily.   silodosin  (RAPAFLO ) 8 MG CAPS capsule TAKE 1 CAPSULE (8 MG TOTAL) BY MOUTH DAILY WITH BREAKFAST.   Vibegron  (GEMTESA ) 75 MG TABS Take 1 tablet (75 mg total) by mouth daily.   vitamin B-12 (CYANOCOBALAMIN ) 1000 MCG tablet Take 1,000 mcg by mouth daily.   [DISCONTINUED] potassium chloride  (K-DUR) 10 MEQ tablet Take 1 tablet (10 mEq total) by mouth 2 (two) times daily.   No facility-administered encounter medications on file as of 05/14/2024.    Objective:   PHYSICAL EXAMINATION:    VITALS:   There were no vitals filed for this visit.   GEN:  The patient appears stated age and is in NAD. HEENT:  Normocephalic, atraumatic.   The mucous membranes are moist. The superficial temporal arteries are without ropiness or tenderness.  He does have some drooling.  Neurological examination:  Orientation: The patient is alert and oriented x3.  He has psychomotor retardation and answers questions slowly, but most are accurate.  This is same as previous. Cranial nerves: There is good facial symmetry with significant facial hypomimia. The speech is fluent and clear.  He is hypophonic.  Soft palate rises symmetrically and there is no tongue deviation. Hearing is intact to conversational tone. Sensation: Sensation is intact to light touch throughout Motor: Strength is at  least antigravity x4.  Movement examination: Tone: Prior to [facility] initiation, he has moderate increased tone on the left and mild to moderate increased tone on the right.  This was markedly improved following loading dose and infusion of Vyalev. Abnormal movements: No significant dyskinesia today. Coordination:  There is  mod decremation, R>L prior to the initiation of Vyalev.  Post initiation, there is improvement. Gait and Station: Patient is forward flexed.  He festinate some and is a bit unstable.  His walking is about the same pre and post initiation of Vyalev.   I have reviewed and interpreted the following labs independently    Chemistry      Component Value Date/Time   NA 142 12/24/2023 1633   K  3.6 12/24/2023 1633   CL 103 12/24/2023 1633   CO2 27 12/24/2023 1633   BUN 18 12/24/2023 1633   CREATININE 0.91 12/24/2023 1633   CREATININE 0.91 08/14/2019 1534      Component Value Date/Time   CALCIUM 9.4 12/24/2023 1633   ALKPHOS 41 10/31/2023 2033   AST 26 10/31/2023 2033   ALT <5 10/31/2023 2033   BILITOT 1.1 10/31/2023 2033       Lab Results  Component Value Date   WBC 6.1 12/24/2023   HGB 14.8 12/24/2023   HCT 42.4 12/24/2023   MCV 93.8 12/24/2023   PLT 158 12/24/2023    Lab Results  Component Value Date   TSH 0.87 11/06/2022     Total time spent on today's visit was *** minutes, including both face-to-face time and nonface-to-face time.  Time included that spent on review of records (prior notes available to me/labs/imaging if pertinent), discussing treatment and goals, answering patient's questions and coordinating care.  The large majority of the visit was spent in patient education with patient/wife on how to use Vyalev and pump questions  Cc:  No primary care provider on file.

## 2024-05-12 NOTE — Telephone Encounter (Signed)
 Notified patient wife as instructed, wife to schedule ct scan.

## 2024-05-14 ENCOUNTER — Ambulatory Visit: Admitting: Neurology

## 2024-05-14 ENCOUNTER — Encounter: Payer: Self-pay | Admitting: Neurology

## 2024-05-14 VITALS — BP 132/76 | HR 64 | Wt 228.0 lb

## 2024-05-14 DIAGNOSIS — G20B2 Parkinson's disease with dyskinesia, with fluctuations: Secondary | ICD-10-CM | POA: Diagnosis not present

## 2024-05-14 DIAGNOSIS — G20A1 Parkinson's disease without dyskinesia, without mention of fluctuations: Secondary | ICD-10-CM | POA: Diagnosis not present

## 2024-05-14 DIAGNOSIS — F02B2 Dementia in other diseases classified elsewhere, moderate, with psychotic disturbance: Secondary | ICD-10-CM | POA: Diagnosis not present

## 2024-05-15 ENCOUNTER — Other Ambulatory Visit (HOSPITAL_COMMUNITY): Payer: Self-pay

## 2024-05-15 ENCOUNTER — Telehealth: Payer: Self-pay | Admitting: Pharmacy Technician

## 2024-05-15 NOTE — Telephone Encounter (Signed)
 Pharmacy Patient Advocate Encounter  Received notification from [employer] that Prior Authorization for NUPLAZID  34MG  has been APPROVED from 12.18.25 to 12.31.26   PA #/Case ID/Reference #: [id number]

## 2024-05-15 NOTE — Telephone Encounter (Signed)
 Pharmacy Patient Advocate Encounter   Received notification from CoverMyMeds that prior authorization for NUPLAZID  34MG  is required/requested.   Insurance verification completed.   The patient is insured through [facility].   Per test claim: PA required; PA submitted to above mentioned insurance via Latent Key/confirmation #/EOC [id number] Status is pending

## 2024-05-20 ENCOUNTER — Ambulatory Visit
Admission: RE | Admit: 2024-05-20 | Discharge: 2024-05-20 | Disposition: A | Discharge status: Home / Self Care | Source: Ambulatory Visit | Attending: Urology | Admitting: Urology

## 2024-05-20 DIAGNOSIS — R310 Gross hematuria: Secondary | ICD-10-CM | POA: Insufficient documentation

## 2024-05-20 MED ORDER — IOHEXOL 300 MG/ML  SOLN
100.0000 mL | Freq: Once | INTRAMUSCULAR | Status: AC | PRN
  Administered 2024-05-20: 100 mL via INTRAVENOUS

## 2024-05-23 ENCOUNTER — Other Ambulatory Visit: Payer: Self-pay | Admitting: Neurology

## 2024-05-24 ENCOUNTER — Ambulatory Visit: Payer: Self-pay | Admitting: Urology

## 2024-05-30 ENCOUNTER — Observation Stay
Admission: EM | Admit: 2024-05-30 | Discharge: 2024-06-02 | Disposition: A | Discharge status: Home Health | Attending: Student | Admitting: Student

## 2024-05-30 ENCOUNTER — Emergency Department

## 2024-05-30 ENCOUNTER — Other Ambulatory Visit: Payer: Self-pay

## 2024-05-30 ENCOUNTER — Encounter: Payer: Self-pay | Admitting: Emergency Medicine

## 2024-05-30 DIAGNOSIS — E66811 Obesity, class 1: Secondary | ICD-10-CM | POA: Insufficient documentation

## 2024-05-30 DIAGNOSIS — G20C Parkinsonism, unspecified: Secondary | ICD-10-CM | POA: Diagnosis not present

## 2024-05-30 DIAGNOSIS — E785 Hyperlipidemia, unspecified: Secondary | ICD-10-CM | POA: Insufficient documentation

## 2024-05-30 DIAGNOSIS — R509 Fever, unspecified: Secondary | ICD-10-CM | POA: Diagnosis present

## 2024-05-30 DIAGNOSIS — J188 Other pneumonia, unspecified organism: Principal | ICD-10-CM | POA: Insufficient documentation

## 2024-05-30 DIAGNOSIS — Z87891 Personal history of nicotine dependence: Secondary | ICD-10-CM | POA: Insufficient documentation

## 2024-05-30 DIAGNOSIS — Z6832 Body mass index (BMI) 32.0-32.9, adult: Secondary | ICD-10-CM | POA: Diagnosis not present

## 2024-05-30 DIAGNOSIS — J9601 Acute respiratory failure with hypoxia: Principal | ICD-10-CM | POA: Insufficient documentation

## 2024-05-30 DIAGNOSIS — I10 Essential (primary) hypertension: Secondary | ICD-10-CM | POA: Diagnosis not present

## 2024-05-30 DIAGNOSIS — E559 Vitamin D deficiency, unspecified: Secondary | ICD-10-CM | POA: Diagnosis not present

## 2024-05-30 DIAGNOSIS — E538 Deficiency of other specified B group vitamins: Secondary | ICD-10-CM | POA: Insufficient documentation

## 2024-05-30 DIAGNOSIS — G20A1 Parkinson's disease without dyskinesia, without mention of fluctuations: Secondary | ICD-10-CM | POA: Diagnosis present

## 2024-05-30 DIAGNOSIS — E611 Iron deficiency: Secondary | ICD-10-CM | POA: Diagnosis not present

## 2024-05-30 DIAGNOSIS — R0902 Hypoxemia: Secondary | ICD-10-CM

## 2024-05-30 LAB — COMPREHENSIVE METABOLIC PANEL WITH GFR
ALT: 32 U/L (ref 0–44)
AST: 38 U/L (ref 15–41)
Albumin: 4.5 g/dL (ref 3.5–5.0)
Alkaline Phosphatase: 40 U/L (ref 38–126)
Anion gap: 13 (ref 5–15)
BUN: 18 mg/dL (ref 8–23)
CO2: 25 mmol/L (ref 22–32)
Calcium: 9.3 mg/dL (ref 8.9–10.3)
Chloride: 102 mmol/L (ref 98–111)
Creatinine, Ser: 0.82 mg/dL (ref 0.61–1.24)
GFR, Estimated: >60 mL/min
Glucose, Bld: 134 mg/dL — ABNORMAL HIGH (ref 70–99)
Potassium: 3.2 mmol/L — ABNORMAL LOW (ref 3.5–5.1)
Sodium: 140 mmol/L (ref 135–145)
Total Bilirubin: 0.8 mg/dL (ref 0.0–1.2)
Total Protein: 7.3 g/dL (ref 6.5–8.1)

## 2024-05-30 LAB — URINALYSIS, COMPLETE (UACMP) WITH MICROSCOPIC
Bacteria, UA: NONE SEEN
Bilirubin Urine: NEGATIVE
Glucose, UA: NEGATIVE mg/dL
Hgb urine dipstick: NEGATIVE
Ketones, ur: NEGATIVE mg/dL
Leukocytes,Ua: NEGATIVE
Nitrite: NEGATIVE
Protein, ur: NEGATIVE mg/dL
Specific Gravity, Urine: 1.012 (ref 1.005–1.030)
pH: 5 (ref 5.0–8.0)

## 2024-05-30 LAB — CBC
HCT: 40.4 % (ref 39.0–52.0)
Hemoglobin: 14.3 g/dL (ref 13.0–17.0)
MCH: 32.7 pg (ref 26.0–34.0)
MCHC: 35.4 g/dL (ref 30.0–36.0)
MCV: 92.4 fL (ref 80.0–100.0)
Platelets: 136 K/uL — ABNORMAL LOW (ref 150–400)
RBC: 4.37 MIL/uL (ref 4.22–5.81)
RDW: 11.9 % (ref 11.5–15.5)
WBC: 9.2 K/uL (ref 4.0–10.5)
nRBC: 0 % (ref 0.0–0.2)

## 2024-05-30 LAB — LIPID PANEL
Cholesterol: 131 mg/dL (ref 0–200)
HDL: 35 mg/dL — ABNORMAL LOW
LDL Cholesterol: 77 mg/dL (ref 0–99)
Total CHOL/HDL Ratio: 3.7 ratio
Triglycerides: 93 mg/dL
VLDL: 19 mg/dL (ref 0–40)

## 2024-05-30 LAB — MAGNESIUM: Magnesium: 1.6 mg/dL — ABNORMAL LOW (ref 1.7–2.4)

## 2024-05-30 LAB — LACTIC ACID, PLASMA
Lactic Acid, Venous: 1.4 mmol/L (ref 0.5–1.9)
Lactic Acid, Venous: 1.2 mmol/L (ref 0.5–1.9)

## 2024-05-30 LAB — RESP PANEL BY RT-PCR (RSV, FLU A&B, COVID)  RVPGX2
Influenza A by PCR: NEGATIVE
Influenza B by PCR: NEGATIVE
Resp Syncytial Virus by PCR: NEGATIVE
SARS Coronavirus 2 by RT PCR: NEGATIVE

## 2024-05-30 MED ORDER — ACETAMINOPHEN 650 MG RE SUPP: 650.0000 mg | Freq: Four times a day (QID) | RECTAL | Status: DC | PRN

## 2024-05-30 MED ORDER — LACTATED RINGERS IV BOLUS (SEPSIS)
1000.0000 mL | Freq: Once | INTRAVENOUS | Status: AC
  Administered 2024-05-30: 1000 mL via INTRAVENOUS

## 2024-05-30 MED ORDER — HEPARIN SODIUM (PORCINE) 5000 UNIT/ML IJ SOLN
5000.0000 [IU] | Freq: Three times a day (TID) | INTRAMUSCULAR | Status: DC
  Administered 2024-05-30 – 2024-06-02 (×9): 5000 [IU] via SUBCUTANEOUS
  Filled 2024-05-30 (×9): qty 1

## 2024-05-30 MED ORDER — SODIUM CHLORIDE 0.9% FLUSH
3.0000 mL | Freq: Two times a day (BID) | INTRAVENOUS | Status: DC
  Administered 2024-05-30 – 2024-06-02 (×7): 3 mL via INTRAVENOUS

## 2024-05-30 MED ORDER — SENNOSIDES-DOCUSATE SODIUM 8.6-50 MG PO TABS
1.0000 | ORAL_TABLET | Freq: Every evening | ORAL | Status: DC | PRN
  Administered 2024-06-02: 1 via ORAL
  Filled 2024-05-30: qty 1

## 2024-05-30 MED ORDER — CYANOCOBALAMIN 500 MCG PO TABS
1000.0000 ug | ORAL_TABLET | Freq: Every day | ORAL | Status: DC
  Administered 2024-05-31 – 2024-06-02 (×3): 1000 ug via ORAL
  Filled 2024-05-30 (×3): qty 2

## 2024-05-30 MED ORDER — TAMSULOSIN HCL 0.4 MG PO CAPS
0.4000 mg | ORAL_CAPSULE | Freq: Every day | ORAL | Status: DC
  Administered 2024-05-31 – 2024-06-02 (×3): 0.4 mg via ORAL
  Filled 2024-05-30 (×3): qty 1

## 2024-05-30 MED ORDER — SODIUM CHLORIDE 0.9 % IV SOLN
500.0000 mg | INTRAVENOUS | Status: AC
  Administered 2024-05-31 – 2024-06-01 (×2): 500 mg via INTRAVENOUS
  Filled 2024-05-30 (×2): qty 5

## 2024-05-30 MED ORDER — ONDANSETRON HCL 4 MG/2ML IJ SOLN: 4.0000 mg | Freq: Four times a day (QID) | INTRAMUSCULAR | Status: DC | PRN

## 2024-05-30 MED ORDER — ONDANSETRON HCL 4 MG PO TABS: 4.0000 mg | ORAL_TABLET | Freq: Four times a day (QID) | ORAL | Status: DC | PRN

## 2024-05-30 MED ORDER — SODIUM CHLORIDE 0.9 % IV SOLN
500.0000 mg | Freq: Once | INTRAVENOUS | Status: AC
  Administered 2024-05-30: 500 mg via INTRAVENOUS
  Filled 2024-05-30: qty 5

## 2024-05-30 MED ORDER — SODIUM CHLORIDE 0.9 % IV SOLN
2.0000 g | INTRAVENOUS | Status: DC
  Administered 2024-05-31 – 2024-06-02 (×3): 2 g via INTRAVENOUS
  Filled 2024-05-30 (×3): qty 20

## 2024-05-30 MED ORDER — ASPIRIN 81 MG PO TBEC
81.0000 mg | DELAYED_RELEASE_TABLET | Freq: Every day | ORAL | Status: DC
  Administered 2024-05-31 – 2024-06-02 (×3): 81 mg via ORAL
  Filled 2024-05-30 (×3): qty 1

## 2024-05-30 MED ORDER — SODIUM CHLORIDE 0.9 % IV SOLN
2.0000 g | Freq: Once | INTRAVENOUS | Status: AC
  Administered 2024-05-30: 2 g via INTRAVENOUS
  Filled 2024-05-30: qty 20

## 2024-05-30 MED ORDER — QUETIAPINE FUMARATE 25 MG PO TABS: 50.0000 mg | ORAL_TABLET | ORAL | Status: DC

## 2024-05-30 MED ORDER — ACETAMINOPHEN 500 MG PO TABS
1000.0000 mg | ORAL_TABLET | Freq: Once | ORAL | Status: AC
  Administered 2024-05-30: 1000 mg via ORAL
  Filled 2024-05-30: qty 2

## 2024-05-30 MED ORDER — FOSCARBIDOPA-FOSLEVODOPA 12-240 MG/ML ~~LOC~~ SOLN
10.0000 mL | Freq: Every day | SUBCUTANEOUS | Status: DC
  Administered 2024-05-30: 10 mL via SUBCUTANEOUS

## 2024-05-30 MED ORDER — FOSCARBIDOPA-FOSLEVODOPA 12-240 MG/ML ~~LOC~~ SOLN
10.0000 mL | Freq: Every day | SUBCUTANEOUS | Status: DC
  Filled 2024-05-30: qty 10

## 2024-05-30 MED ORDER — LACTATED RINGERS IV BOLUS
1000.0000 mL | Freq: Once | INTRAVENOUS | Status: AC
  Administered 2024-05-30: 1000 mL via INTRAVENOUS

## 2024-05-30 MED ORDER — QUETIAPINE FUMARATE 25 MG PO TABS
25.0000 mg | ORAL_TABLET | Freq: Two times a day (BID) | ORAL | Status: DC
  Administered 2024-05-31 – 2024-06-02 (×5): 25 mg via ORAL
  Filled 2024-05-30 (×5): qty 1

## 2024-05-30 MED ORDER — PIMAVANSERIN TARTRATE 34 MG PO CAPS
34.0000 mg | ORAL_CAPSULE | Freq: Every day | ORAL | Status: DC
  Administered 2024-05-31 – 2024-06-02 (×3): 34 mg via ORAL
  Filled 2024-05-30 (×5): qty 1

## 2024-05-30 MED ORDER — MELATONIN 5 MG PO TABS
5.0000 mg | ORAL_TABLET | Freq: Every day | ORAL | Status: DC
  Administered 2024-05-30 – 2024-06-01 (×3): 5 mg via ORAL
  Filled 2024-05-30 (×3): qty 1

## 2024-05-30 MED ORDER — LACTATED RINGERS IV SOLN: INTRAVENOUS | Status: AC

## 2024-05-30 MED ORDER — MAGNESIUM SULFATE 4 GM/100ML IV SOLN
4.0000 g | Freq: Once | INTRAVENOUS | Status: AC
  Administered 2024-05-30: 4 g via INTRAVENOUS
  Filled 2024-05-30: qty 100

## 2024-05-30 MED ORDER — QUETIAPINE FUMARATE 25 MG PO TABS
50.0000 mg | ORAL_TABLET | Freq: Every day | ORAL | Status: DC
  Administered 2024-05-30 – 2024-06-01 (×3): 50 mg via ORAL
  Filled 2024-05-30 (×3): qty 2

## 2024-05-30 MED ORDER — ORAL CARE MOUTH RINSE: 15.0000 mL | OROMUCOSAL | Status: DC | PRN

## 2024-05-30 MED ORDER — ACETAMINOPHEN 325 MG PO TABS
650.0000 mg | ORAL_TABLET | Freq: Four times a day (QID) | ORAL | Status: DC | PRN
  Administered 2024-05-30 – 2024-06-01 (×5): 650 mg via ORAL
  Filled 2024-05-30 (×5): qty 2

## 2024-05-30 MED ORDER — GUAIFENESIN-DM 100-10 MG/5ML PO SYRP
10.0000 mL | ORAL_SOLUTION | ORAL | Status: DC | PRN
  Administered 2024-05-30 – 2024-05-31 (×2): 10 mL via ORAL
  Filled 2024-05-30 (×2): qty 10

## 2024-05-30 NOTE — ED Notes (Signed)
 Male purwick placed on the pt. Fall risk items in place.

## 2024-05-30 NOTE — ED Provider Notes (Addendum)
 "  [facility] Provider Note    Event Date/Time   First MD Initiated Contact with Patient 05/30/24 [phone]     (approximate)  History   Chief Complaint: Fever  HPI  [name] [name] is a 64 y.o. male with a past medical history of Parkinson's, presents to the emergency department for generalized fatigue weakness found to be febrile.  According to the wife patient has had a slight cough for the last day or 2 this morning patient was found to be very confused and out of it very weak per wife.  She checked the temperature and it appeared to have a high temperature, she was concerned so brought the patient to the emergency department.  Here the patient is somnolent but will awaken to voice briefly answers questions then falls back asleep.  Found to be febrile to 100.4 remainder of the vital signs show no significant finding.  Patient denies any chest pain or abdominal pain.  Wife denies any nausea vomiting diarrhea urinary symptoms.  Physical Exam   Triage Vital Signs: ED Triage Vitals  Encounter Vitals Group     BP 05/30/24 0932 (!) 125/47     Girls Systolic BP Percentile --      Girls Diastolic BP Percentile --      Boys Systolic BP Percentile --      Boys Diastolic BP Percentile --      Pulse Rate 05/30/24 0932 74     Resp 05/30/24 0932 18     Temp 05/30/24 0932 (!) 100.4 F (38 C)     Temp Source 05/30/24 0932 Oral     SpO2 05/30/24 0932 92 %     Weight 05/30/24 0933 224 lb (101.6 kg)     Height 05/30/24 0933 5' 11 (1.803 m)     Head Circumference --      Peak Flow --      Pain Score --      Pain Loc --      Pain Education --      Exclude from Growth Chart --     Most recent vital signs: Vitals:   05/30/24 0932  BP: (!) 125/47  Pulse: 74  Resp: 18  Temp: (!) 100.4 F (38 C)  SpO2: 92%    General: Awake, no distress.  CV:  Good peripheral perfusion.  Regular rate and rhythm  Resp:  Normal effort.  Equal breath sounds bilaterally.  Abd:  No  distention.  Soft, nontender.  No rebound or guarding.  ED Results / Procedures / Treatments   EKG  EKG viewed and interpreted by myself shows a normal sinus rhythm at 75 bpm with a narrow QRS, normal axis, normal intervals, no concerning ST changes.  RADIOLOGY  I have reviewed and.  The chest x-ray images.  No obvious consolidation on my evaluation. Radiology has read the x-ray as asymmetrically elevated right hemidiaphragm. CT scan shows multifocal pneumonia in the right lower lobe.   MEDICATIONS ORDERED IN ED: Medications  lactated ringers bolus 1,000 mL (has no administration in time range)     IMPRESSION / MDM / ASSESSMENT AND PLAN / ED COURSE  I reviewed the triage vital signs and the nursing notes.  Patient's presentation is most consistent with acute presentation with potential threat to life or bodily function.  Patient presents to the emergency department for generalized fatigue weakness found to be febrile.  Patient's only symptom is a slight cough over the last few days.  We  will check labs including a CBC chemistry we will obtain a chest x-ray and a urinalysis.  Will obtain a COVID/flu swab.  Will IV hydrate treat with Tylenol  and continue to closely monitor while awaiting further results.  Patient and wife agreeable to plan.  Patient's labs today are reassuring with a normal CBC with normal white blood cell count reassuring chemistry lactic acid of 1.4 respiratory panel was negative.  Patient however has become hypoxic 88% on room air placed on 2 L nasal cannula.  Chest x-ray shows no obvious findings we will proceed with a CT scan of the chest.    CT scan of the chest shows multifocal pneumonia most involving the right lower lobe.  We will cover with broad-spectrum antibiotics will admit to the hospital service for further workup and treatment.  CRITICAL CARE Performed by: Franky Moores   Total critical care time: 30 minutes  Critical care time was exclusive  of separately billable procedures and treating other patients.  Critical care was necessary to treat or prevent imminent or life-threatening deterioration.  Critical care was time spent personally by me on the following activities: development of treatment plan with patient and/or surrogate as well as nursing, discussions with consultants, evaluation of patient's response to treatment, examination of patient, obtaining history from patient or surrogate, ordering and performing treatments and interventions, ordering and review of laboratory studies, ordering and review of radiographic studies, pulse oximetry and re-evaluation of patient's condition.   FINAL CLINICAL IMPRESSION(S) / ED DIAGNOSES   Weakness Fever Multifocal pneumonia Respiratory failure/hypoxia  Note:  This document was prepared using Dragon voice recognition software and may include unintentional dictation errors.   Moores Franky, MD 05/30/24 1532    Moores Franky, MD 05/30/24 1533  "

## 2024-05-30 NOTE — Plan of Care (Signed)

## 2024-05-30 NOTE — H&P (Signed)
 " History and Physical    [name] [name] FMW:[id number] DOB: 09-27-1960 DOA: 05/30/2024  DOS: the patient was seen and examined on 05/30/2024  PCP: Pcp, No   Patient coming from: Home  I have personally briefly reviewed patient's old medical records in [facility] and CareEverywhere  HPI:   [name] [name] is a 64 y.o. year old male with medical history of HTN, HLD, Parkinson's disease, MCI presenting to the ED with fever and confusion.  Patient is alert and oriented on my evaluation.  He has difficulty with recall and wife states he does have dementia that waxes and wanes.  She states patient has been having worsening cough over the last week.  Denies any sick contacts.  States patient felt very hot prompting him to bring him to the ED for evaluation.  Patient denying any other URI symptoms such for coughing.  Does report good appetite and no aspiration with eating.  On arrival to the ED patient was hemodynamically stable, CBC without leukocytosis or anemia.  There is mild thrombocytopenia.  CMP with mild hypokalemia and mild hyperglycemia otherwise unremarkable.  Respiratory panel negative for COVID, flu, RSV.  Lactic acid normal.  UA without signs of infection.  Blood cultures obtained and patient given ceftriaxone azithromycin.  Given need for continued care, [facility] contacted for admission.  Review of Systems: As mentioned in the history of present illness. All other systems reviewed and are negative.   Past Medical History:  Diagnosis Date   Alcohol abuse 09/08/2019   Episodes of formed visual hallucinations    Essential hypertension, benign 01/31/2007   Herniated disc, cervical    History of skin cancer    basal cell; face and abdomen   Hyperlipidemia    Mild neurocognitive disorder with Lewy bodies, possible 05/03/2021   Parkinson's disease 04/27/2016   REM sleep behaviors    Syringomyelia 09/08/2019   Tremor of right hand 06/21/2015    Past Surgical History:  Procedure  Laterality Date   BASAL CELL CARCINOMA EXCISION     EYE SURGERY     as a child   FINGER FRACTURE SURGERY     5th digit, left hand, x2   HERNIA REPAIR     age 69     Allergies[1]  Family History  Problem Relation Age of Onset   Hypertension Mother    Diabetes Father    Hypertension Father    Heart attack Father    Parkinson's disease Maternal Grandmother    Parkinsonism Maternal Grandmother     Prior to Admission medications  Medication Sig Start Date End Date Taking? Authorizing Provider  aspirin EC 81 MG tablet Take 81 mg by mouth daily.    [provider]  losartan -hydrochlorothiazide  (HYZAAR) 100-25 MG tablet TAKE ONE TABLET BY MOUTH DAILY. 07/12/23   Jimmy Charlie FERNS, MD  Pimavanserin Tartrate  (NUPLAZID ) 34 MG CAPS TAKE 1 CAPSULE BY MOUTH 1 TIME A DAY 04/21/24   Tat, Rebecca S, DO  QUEtiapine  (SEROQUEL ) 25 MG tablet Take 2 tablets (50 mg total) by mouth 2 (two) times daily. 02/11/24   Tat, Asberry RAMAN, DO  silodosin  (RAPAFLO ) 8 MG CAPS capsule TAKE 1 CAPSULE (8 MG TOTAL) BY MOUTH DAILY WITH BREAKFAST. 04/18/24   Stoioff, Glendia BROCKS, MD  Vibegron  (GEMTESA ) 75 MG TABS Take 1 tablet (75 mg total) by mouth daily. 03/26/24   Stoioff, Glendia BROCKS, MD  vitamin B-12 (CYANOCOBALAMIN ) 1000 MCG tablet Take 1,000 mcg by mouth daily.    [provider]  potassium chloride  (K-DUR) 10 MEQ tablet Take 1 tablet (10 mEq total) by mouth 2 (two) times daily. 07/01/12 08/14/19  Norins, Ozell BRAVO, MD    Social History:  reports that he has never smoked. He has been exposed to tobacco smoke. He has quit using smokeless tobacco. He reports current alcohol use. He reports that he does not use drugs. Lives with wife.  Does not smoke.  Drinks alcohol occasionally.  He is assistance with ADLs and IADLs.   Physical Exam: Vitals:   05/30/24 1200 05/30/24 1330 05/30/24 1500 05/30/24 1558  BP: (!) 106/95 100/63 (!) 117/55   Pulse: 67 (!) 52 (!) 54   Resp:  18    Temp:    98.1 F (36.7 C)   TempSrc:    Oral  SpO2: 98% 98% 96%   Weight:      Height:        Gen: NAD HENT: NCAT CV: normal heart sounds Lung: Rhonchi present diffusely Abd: No TTP, normal bowel sounds MSK: No asymmetry, good bulk and tone Neuro: alert and oriented x 4   Labs on Admission: I have personally reviewed following labs and imaging studies  CBC: Recent Labs  Lab 05/30/24 0939  WBC 9.2  HGB 14.3  HCT 40.4  MCV 92.4  PLT 136*   Basic Metabolic Panel: Recent Labs  Lab 05/30/24 0939 05/30/24 1400  NA 140  --   K 3.2*  --   CL 102  --   CO2 25  --   GLUCOSE 134*  --   BUN 18  --   CREATININE 0.82  --   CALCIUM 9.3  --   MG  --  1.6*   GFR: Estimated Creatinine Clearance: 109 mL/min (by C-G formula based on SCr of 0.82 mg/dL). Liver Function Tests: Recent Labs  Lab 05/30/24 0939  AST 38  ALT 32  ALKPHOS 40  BILITOT 0.8  PROT 7.3  ALBUMIN 4.5   No results for input(s): LIPASE, AMYLASE in the last 168 hours. No results for input(s): AMMONIA in the last 168 hours. Coagulation Profile: No results for input(s): INR, PROTIME in the last 168 hours. Cardiac Enzymes: No results for input(s): CKTOTAL, CKMB, CKMBINDEX, TROPONINI, TROPONINIHS in the last 168 hours. BNP (last 3 results) No results for input(s): BNP in the last 8760 hours. HbA1C: No results for input(s): HGBA1C in the last 72 hours. CBG: No results for input(s): GLUCAP in the last 168 hours. Lipid Profile: No results for input(s): CHOL, HDL, LDLCALC, TRIG, CHOLHDL, LDLDIRECT in the last 72 hours. Thyroid  Function Tests: No results for input(s): TSH, T4TOTAL, FREET4, T3FREE, THYROIDAB in the last 72 hours. Anemia Panel: No results for input(s): VITAMINB12, FOLATE, FERRITIN, TIBC, IRON, RETICCTPCT in the last 72 hours. Urine analysis:    Component Value Date/Time   COLORURINE YELLOW (A) 05/30/2024 1146   APPEARANCEUR CLEAR (A) 05/30/2024 1146    APPEARANCEUR Clear 03/26/2024 1419   LABSPEC 1.012 05/30/2024 1146   PHURINE 5.0 05/30/2024 1146   GLUCOSEU NEGATIVE 05/30/2024 1146   GLUCOSEU NEGATIVE 05/27/2009 0759   HGBUR NEGATIVE 05/30/2024 1146   BILIRUBINUR NEGATIVE 05/30/2024 1146   BILIRUBINUR Negative 03/26/2024 1419   KETONESUR NEGATIVE 05/30/2024 1146   PROTEINUR NEGATIVE 05/30/2024 1146   UROBILINOGEN 0.2 11/06/2022 1519   UROBILINOGEN 0.2 05/27/2009 0759   NITRITE NEGATIVE 05/30/2024 1146   LEUKOCYTESUR NEGATIVE 05/30/2024 1146    Radiological Exams on Admission: I have personally reviewed images CT CHEST WO CONTRAST Result  Date: 05/30/2024 CLINICAL DATA:  Fever, cough, pneumonia EXAM: CT CHEST WITHOUT CONTRAST TECHNIQUE: Multidetector CT imaging of the chest was performed following the standard protocol without IV contrast. RADIATION DOSE REDUCTION: This exam was performed according to the departmental dose-optimization program which includes automated exposure control, adjustment of the mA and/or kV according to patient size and/or use of iterative reconstruction technique. COMPARISON:  Radiograph of same day FINDINGS: Cardiovascular: No evidence of thoracic aortic aneurysm. Normal cardiac size. No pericardial effusion. Coronary artery calcifications are noted. Mediastinum/Nodes: No enlarged mediastinal or axillary lymph nodes. Thyroid  gland, trachea, and esophagus demonstrate no significant findings. Lungs/Pleura: No pneumothorax or pleural effusion is noted. Minimal left posterior basilar subsegmental atelectasis or scarring is noted. Multiple ill-defined patchy airspace opacities are noted in right lower lobe concerning for pneumonia. Upper Abdomen: No acute abnormality. Musculoskeletal: No chest wall mass or suspicious bone lesions identified. IMPRESSION: 1. Multiple ill-defined patchy airspace opacities are noted in right lower lobe concerning for multifocal pneumonia. 2. Coronary artery calcifications are noted suggesting  coronary artery disease. Electronically Signed   By: Lynwood Landy Raddle M.D.   On: 05/30/2024 12:46   DG Chest Portable 1 View Result Date: 05/30/2024 EXAM: 1 VIEW(S) XRAY OF THE CHEST 05/30/2024 09:57:18 AM COMPARISON: AP radiograph of the chest dated 01/11/2010. CLINICAL HISTORY: cough, fever FINDINGS: LUNGS AND PLEURA: Interval development of an asymmetrically Elevated right hemidiaphragm. No focal pulmonary opacity. No pleural effusion. No pneumothorax. HEART AND MEDIASTINUM: No acute abnormality of the cardiac and mediastinal silhouettes. BONES AND SOFT TISSUES: Thoracic spondylosis. No acute osseous abnormality. IMPRESSION: 1. Interval development of an asymmetrically elevated right hemidiaphragm. 2. Thoracic spondylosis. Electronically signed by: Evalene Coho MD 05/30/2024 10:51 AM EST RP Workstation: [id number]    EKG: My personal interpretation of EKG shows: sinus rhythm without any acute ST changes.    Assessment/Plan Principal Problem:   Acute hypoxic respiratory failure (HCC) Active Problems:   Hyperlipidemia   Essential hypertension, benign   Parkinson's disease (HCC)   Patient with acute hypoxic respiratory failure secondary to pneumonia.  Imaging shows multifocal pneumonia and with his history of Parkinson's, suspicion aspiration pneumonia.  Per wife patient does not have other aspiration symptoms.  Speech evaluation and they are agreeable.  Will continue IV antibiotics for his pneumonia.  Monitor culture data, leukocyte count and fever curve.  Tmax has been 100.4.  Will try to wean his oxygen as able.  Parkinson's disease: Patient is on foscarbidopa-foslevodopa via pump infusion.  Per wife patient frequently missed this medication prompting neurology to use this continuous infusion.  She states she will bring this medication later today.  Hypertension: Holding home antihypertensive given active infection and borderline hypotensive blood pressures.  Hyperlipidemia: Not on any  pharmacotherapy but will get lipid panel here.  Will defer starting any statin through outpatient setting.  VTE prophylaxis:  SQ Heparin  Diet: NPO Code Status:  Full Code Telemetry:  Admission status: Observation, Telemetry bed Patient is from: Home Anticipated d/c is to: Home Anticipated d/c is in: 1-2 days   Family Communication: Updated at bedside  Consults called: None   Severity of Illness: The appropriate patient status for this patient is OBSERVATION. Observation status is judged to be reasonable and necessary in order to provide the required intensity of service to ensure the patient's safety. The patient's presenting symptoms, physical exam findings, and initial radiographic and laboratory data in the context of their medical condition is felt to place them at decreased risk for further clinical deterioration.  Furthermore, it is anticipated that the patient will be medically stable for discharge from the hospital within 2 midnights of admission.    Morene Bathe, MD Jolynn DEL. [facility]     [1]  Allergies Allergen Reactions   Hydrocodone Nausea Only   "

## 2024-05-30 NOTE — ED Triage Notes (Signed)
 Pt to ED via POV with his wife who states that she noticed this morning pt was very hot feeling. Wife states that pt was incoherent and having chills as well. Wife reports pt has had cough. Pt non-ambulatory with a hx/o Parkinson's Disease.

## 2024-05-31 DIAGNOSIS — J9601 Acute respiratory failure with hypoxia: Secondary | ICD-10-CM | POA: Diagnosis not present

## 2024-05-31 LAB — CBC
HCT: 37.7 % — ABNORMAL LOW (ref 39.0–52.0)
Hemoglobin: 12.6 g/dL — ABNORMAL LOW (ref 13.0–17.0)
MCH: 31.8 pg (ref 26.0–34.0)
MCHC: 33.4 g/dL (ref 30.0–36.0)
MCV: 95.2 fL (ref 80.0–100.0)
Platelets: 119 K/uL — ABNORMAL LOW (ref 150–400)
RBC: 3.96 MIL/uL — ABNORMAL LOW (ref 4.22–5.81)
RDW: 12 % (ref 11.5–15.5)
WBC: 7.3 K/uL (ref 4.0–10.5)
nRBC: 0 % (ref 0.0–0.2)

## 2024-05-31 LAB — BASIC METABOLIC PANEL WITH GFR
Anion gap: 9 (ref 5–15)
BUN: 15 mg/dL (ref 8–23)
CO2: 26 mmol/L (ref 22–32)
Calcium: 8.5 mg/dL — ABNORMAL LOW (ref 8.9–10.3)
Chloride: 104 mmol/L (ref 98–111)
Creatinine, Ser: 0.67 mg/dL (ref 0.61–1.24)
GFR, Estimated: >60 mL/min
Glucose, Bld: 102 mg/dL — ABNORMAL HIGH (ref 70–99)
Potassium: 3.1 mmol/L — ABNORMAL LOW (ref 3.5–5.1)
Sodium: 139 mmol/L (ref 135–145)

## 2024-05-31 LAB — VITAMIN B12: Vitamin B-12: 652 pg/mL (ref 180–914)

## 2024-05-31 LAB — IRON AND TIBC
Iron: 22 ug/dL — ABNORMAL LOW (ref 45–182)
Saturation Ratios: 11 % — ABNORMAL LOW (ref 17.9–39.5)
TIBC: 195 ug/dL — ABNORMAL LOW (ref 250–450)
UIBC: 172 ug/dL

## 2024-05-31 LAB — HIV ANTIBODY (ROUTINE TESTING W REFLEX): HIV Screen 4th Generation wRfx: NONREACTIVE

## 2024-05-31 LAB — VITAMIN D 25 HYDROXY (VIT D DEFICIENCY, FRACTURES): Vit D, 25-Hydroxy: <6 ng/mL — ABNORMAL LOW (ref 30–100)

## 2024-05-31 LAB — FOLATE: Folate: 3.7 ng/mL — ABNORMAL LOW

## 2024-05-31 LAB — MAGNESIUM: Magnesium: 2.3 mg/dL (ref 1.7–2.4)

## 2024-05-31 LAB — GLUCOSE, CAPILLARY: Glucose-Capillary: 103 mg/dL — ABNORMAL HIGH (ref 70–99)

## 2024-05-31 LAB — PHOSPHORUS: Phosphorus: 2.4 mg/dL — ABNORMAL LOW (ref 2.5–4.6)

## 2024-05-31 MED ORDER — FOLIC ACID 1 MG PO TABS
1.0000 mg | ORAL_TABLET | Freq: Every day | ORAL | Status: DC
  Administered 2024-05-31 – 2024-06-02 (×3): 1 mg via ORAL
  Filled 2024-05-31 (×3): qty 1

## 2024-05-31 MED ORDER — VITAMIN D (ERGOCALCIFEROL) 1.25 MG (50000 UNIT) PO CAPS
50000.0000 [IU] | ORAL_CAPSULE | ORAL | Status: DC
  Administered 2024-05-31: 50000 [IU] via ORAL
  Filled 2024-05-31: qty 1

## 2024-05-31 MED ORDER — GUAIFENESIN-DM 100-10 MG/5ML PO SYRP
10.0000 mL | ORAL_SOLUTION | Freq: Four times a day (QID) | ORAL | Status: DC | PRN
  Administered 2024-06-02: 10 mL via ORAL
  Filled 2024-05-31: qty 10

## 2024-05-31 MED ORDER — POLYSACCHARIDE IRON COMPLEX 150 MG PO CAPS
150.0000 mg | ORAL_CAPSULE | Freq: Every day | ORAL | Status: DC
  Administered 2024-05-31 – 2024-06-02 (×3): 150 mg via ORAL
  Filled 2024-05-31 (×3): qty 1

## 2024-05-31 MED ORDER — POTASSIUM CHLORIDE 10 MEQ/100ML IV SOLN
10.0000 meq | INTRAVENOUS | Status: AC
  Administered 2024-05-31 (×4): 10 meq via INTRAVENOUS
  Filled 2024-05-31 (×4): qty 100

## 2024-05-31 MED ORDER — K PHOS MONO-SOD PHOS DI & MONO 155-852-130 MG PO TABS
500.0000 mg | ORAL_TABLET | Freq: Four times a day (QID) | ORAL | Status: AC
  Administered 2024-05-31 (×2): 500 mg via ORAL
  Filled 2024-05-31 (×2): qty 2

## 2024-05-31 MED ORDER — VITAMIN C 500 MG PO TABS
500.0000 mg | ORAL_TABLET | Freq: Every day | ORAL | Status: DC
  Administered 2024-05-31 – 2024-06-02 (×3): 500 mg via ORAL
  Filled 2024-05-31 (×3): qty 1

## 2024-05-31 MED ORDER — POTASSIUM CHLORIDE CRYS ER 20 MEQ PO TBCR
40.0000 meq | EXTENDED_RELEASE_TABLET | Freq: Once | ORAL | Status: AC
  Administered 2024-05-31: 40 meq via ORAL
  Filled 2024-05-31: qty 2

## 2024-05-31 MED ORDER — FOSCARBIDOPA-FOSLEVODOPA 12-240 MG/ML ~~LOC~~ SOLN: 12.0000 mg | Freq: Every day | SUBCUTANEOUS | Status: DC

## 2024-05-31 MED ORDER — FOSCARBIDOPA-FOSLEVODOPA 12-240 MG/ML ~~LOC~~ SOLN
10.0000 mL | Freq: Every day | SUBCUTANEOUS | Status: DC
  Administered 2024-05-31 – 2024-06-01 (×2): 10 mL via SUBCUTANEOUS

## 2024-05-31 MED ORDER — GUAIFENESIN ER 600 MG PO TB12
600.0000 mg | ORAL_TABLET | Freq: Two times a day (BID) | ORAL | Status: DC
  Administered 2024-05-31 – 2024-06-02 (×5): 600 mg via ORAL
  Filled 2024-05-31 (×5): qty 1

## 2024-05-31 NOTE — Care Management Obs Status (Signed)
 MEDICARE OBSERVATION STATUS NOTIFICATION   Patient Details  Name: [name] [name] MRN: [id number] Date of Birth: 1961-04-15   Medicare Observation Status Notification Given:  Yes    Delphine KANDICE Bring, RN 05/31/2024, 11:35 AM

## 2024-05-31 NOTE — Progress Notes (Signed)
 [facility] Progress Note  Patient: [name] [name]    FMW:[id number]  DOA: 05/30/2024     Date of Service: the patient was seen and examined on 05/31/2024  Chief Complaint  Patient presents with   Fever   Brief hospital course:  [name] [name] is a 64 y.o. year old male with medical history of HTN, HLD, Parkinson's disease, MCI presenting to the ED with fever and confusion.  Patient is alert and oriented on my evaluation.  He has difficulty with recall and wife states he does have dementia that waxes and wanes.  She states patient has been having worsening cough over the last week.  Denies any sick contacts.  States patient felt very hot prompting him to bring him to the ED for evaluation.  Patient denying any other URI symptoms such for coughing.  Does report good appetite and no aspiration with eating.   ED w/up: Hemodynamically stable CBC without leukocytosis or anemia.  There is mild thrombocytopenia.  CMP with mild hypokalemia and mild hyperglycemia otherwise unremarkable.  Respiratory panel negative for COVID, flu, RSV.  Lactic acid normal.  UA without signs of infection.  Blood cultures obtained and patient given ceftriaxone  azithromycin .  Given need for continued care, [facility] contacted for admission.   Assessment and Plan:  # Acute hypoxic respiratory failure secondary to pneumonia. CT scan shows multifocal right lower lobe pneumonia SLP eval done, recommended regular diet with thin liquids, moderate risk of aspiration Continue aspiration precautions Continue ceftriaxone  azithromycin  Mucinex  600 mg p.o. twice daily Robitussin DM as needed Follow culture   # Parkinson's disease: Patient is on foscarbidopa-foslevodopa via pump infusion.  Per wife patient frequently missed this medication prompting neurology to use this continuous infusion.  She states she will bring this medication later.    # Hypertension:  Holding home antihypertensive given active infection and borderline  hypotensive blood pressures.   # Hyperlipidemia: Not on any pharmacotherapy.  LDL 77, well controlled  # Iron  deficiency: Tsat 11%, avoided IV iron  due to active infection Started oral iron  supplement vitamin C  Follow-up with PCP to repeat iron  profile in between 3 to 6 months  # Folic acid  deficiency: folic acid  level 3.7, started folic acid  1 mg p.o. daily.  Follow-up with PCP to repeat folic acid  level in between 3 to 6 months.  # Vitamin D  deficiency: started vitamin D  50,000 units p.o. weekly, follow with PCP to repeat vitamin D  level after 3 to 6 months.     Body mass index is 32.01 kg/m.  Interventions:  Diet: Regular diet DVT Prophylaxis: Subcutaneous Heparin     Advance goals of care discussion: Full code  Family Communication: family was present at bedside, at the time of interview.  The pt provided permission to discuss medical plan with the family. Opportunity was given to ask question and all questions were answered satisfactorily.  1/3 management plan discussed with patient's wife at bedside  Disposition:  Pt is from Home, admitted with PNA, still on IV antibiotics, which precludes a safe discharge. Discharge to home versus SNF, TBD after PT and OT eval, when stable, may need few days to improve.  Subjective: No significant events overnight.  Patient has cough with phlegm production, overall poor historian.  Denies any specific complaints.  Physical Exam: General: NAD, lying comfortably Appear in no distress, affect appropriate Eyes: PERRLA ENT: Oral Mucosa Clear, moist  Neck: no JVD,  Cardiovascular: S1 and S2 Present, no Murmur,  Respiratory: Equal air entry bilaterally, bilateral  crackles, no significant wheezes  Abdomen: Bowel Sound present, Soft and no tenderness,  Skin: no rashes Extremities: no Pedal edema, no calf tenderness Neurologic: without any new focal findings Gait not checked due to patient safety concerns  Vitals:   05/31/24 0745 05/31/24  1045 05/31/24 1100 05/31/24 1201  BP: (!) 103/57   (!) 99/55  Pulse: (!) 56 67 82 60  Resp: 18   16  Temp: 98.1 F (36.7 C)   98.1 F (36.7 C)  TempSrc: Axillary     SpO2: 96% 93% 95% 91%  Weight:      Height:        Intake/Output Summary (Last 24 hours) at 05/31/2024 1531 Last data filed at 05/31/2024 1300 Gross per 24 hour  Intake 1254.95 ml  Output --  Net 1254.95 ml   Filed Weights   05/30/24 0933 05/31/24 0459  Weight: 101.6 kg 104.1 kg    Data Reviewed: I have personally reviewed and interpreted daily labs, tele strips, imagings as discussed above. I reviewed all nursing notes, pharmacy notes, vitals, pertinent old records I have discussed plan of care as described above with RN and patient/family.  CBC: Recent Labs  Lab 05/30/24 0939 05/31/24 0543  WBC 9.2 7.3  HGB 14.3 12.6*  HCT 40.4 37.7*  MCV 92.4 95.2  PLT 136* 119*   Basic Metabolic Panel: Recent Labs  Lab 05/30/24 0939 05/30/24 1400 05/31/24 0543 05/31/24 1115  NA 140  --  139  --   K 3.2*  --  3.1*  --   CL 102  --  104  --   CO2 25  --  26  --   GLUCOSE 134*  --  102*  --   BUN 18  --  15  --   CREATININE 0.82  --  0.67  --   CALCIUM 9.3  --  8.5*  --   MG  --  1.6*  --  2.3  PHOS  --   --   --  2.4*    Studies: No results found.  Scheduled Meds:  aspirin  EC  81 mg Oral Daily   cyanocobalamin   1,000 mcg Oral Daily   guaiFENesin   600 mg Oral BID   heparin   5,000 Units Subcutaneous Q8H   melatonin  5 mg Oral QHS   Pimavanserin  Tartrate  34 mg Oral Daily   QUEtiapine   25 mg Oral BID   QUEtiapine   50 mg Oral QHS   sodium chloride  flush  3 mL Intravenous Q12H   tamsulosin   0.4 mg Oral QPC breakfast   Continuous Infusions:  azithromycin  500 mg (05/31/24 1013)   cefTRIAXone  (ROCEPHIN )  IV 2 g (05/31/24 0926)   PRN Meds: acetaminophen  **OR** acetaminophen , guaiFENesin -dextromethorphan , ondansetron  **OR** ondansetron  (ZOFRAN ) IV, mouth rinse, senna-docusate  Time spent: 55  minutes  Author: ELVAN SOR. MD [facility] 05/31/2024 3:31 PM  To reach On-call, see care teams to locate the attending and reach out to them via [email or web address]. If 7PM-7AM, please contact night-coverage If you still have difficulty reaching the attending provider, please page the [facility] (Director on Call) for [facility] on amion for assistance.

## 2024-05-31 NOTE — Plan of Care (Signed)

## 2024-05-31 NOTE — Progress Notes (Signed)
 OT Cancellation Note  Patient Details Name: [name] [name] MRN: [id number] DOB: 1960/08/12   Cancelled Treatment:    Reason Eval/Treat Not Completed: OT screened, no needs identified, will sign off. Per wife at bedside, she assists pt with all ADLs including self feeding, has all DME/AE/AD and denies current need for acute OT services. Will notify staff if new needs arise, will sign off in house at this time.  [name] [name] 05/31/2024, 12:13 PM

## 2024-05-31 NOTE — Evaluation (Signed)
 Physical Therapy Evaluation Patient Details Name: [name] [name] MRN: [id number] DOB: Oct 06, 1960 Today's Date: 05/31/2024  History of Present Illness  [name] [name] is a 64 y.o. year old male with medical history of HTN, HLD, Parkinson's disease, MCI presenting to the ED with fever and confusion. He was diagnosed with pneumonia and started on IV antibiotics.  Clinical Impression  64 yo Male admitted with fever/confusion, was diagnosed with pneumonia. Patient has a PMH significant for Parkinson's Disease. His wife reports they have been adapting them home to make it more accessible including grab bars in bathroom, shower seat; She is hoping to add an elevated toilet seat and get railings for steps to enter. He lives in a multi-story home but bedroom/bathroom on main floor. Patient does report several falls in last few months mostly occurring outside on unlevel ground. He has tremors in hands and demonstrates generalized weakness. He requires max A for rolling/bed mobility. Wife states that bed mobility is his biggest limiting factor. Once sitting edge of bed, he was able to pull to stand with hand hold assist (min A). He stood edge of bed for several minutes and then ambulated 15 feet in room with hand held assist (min A). PT educated patient/caregiver on various Parkinson's Disease exercise programs including [employer], [facility] (parkinson's wellness recovery) and [employer] (boxing program). Vitals monitored throughout session, SPo2 >95% while on room air during mobility. At end of session when therapist was helping patient change his gown, noticed that his IV had come out. RN was notified and she came to room to replace. Patient would benefit from skilled PT intervention to improve strength and mobility.       If plan is discharge home, recommend the following: A little help with walking and/or transfers;A lot of help with bathing/dressing/bathroom;Assistance with cooking/housework;Supervision due  to cognitive status;Assistance with feeding;Assist for transportation;Direct supervision/assist for medications management   Can travel by private vehicle        Equipment Recommendations None recommended by PT  Recommendations for Other Services       Functional Status Assessment Patient has had a recent decline in their functional status and demonstrates the ability to make significant improvements in function in a reasonable and predictable amount of time.     Precautions / Restrictions        Mobility  Bed Mobility Overal bed mobility: Needs Assistance Bed Mobility: Rolling, Supine to Sit, Sit to Supine Rolling: Max assist, Used rails   Supine to sit: Max assist Sit to supine: Max assist   General bed mobility comments: Required extra time/effort for bed mobility; Wife states that getting in/out of bed is his most difficult task; was instructed in pre bed mobility exercise (reaching across body towards railing to facilitate rolling x5 reps each direction, requiring max Vcs and moderate tactile cues); also instructed in positioning for bridge to scoot sideways in bed (mod A )- x15 minutes    Transfers Overall transfer level: Needs assistance Equipment used: None, 1 person hand held assist Transfers: Sit to/from Stand Sit to Stand: From elevated surface, Min assist           General transfer comment: Patient able to pull self up to stand initially required 2 hand assist to stand but progressing to 1 hand assist; stood edge of bed x5+ min while using external catheter;    Ambulation/Gait Ambulation/Gait assistance: Min assist Gait Distance (Feet): 15 Feet Assistive device: 1 person hand held assist Gait Pattern/deviations: Step-to pattern, Decreased step  length - right, Decreased step length - left Gait velocity: decreased     General Gait Details: ambulates from bed to doorway with min A, 1 hand hold assist; ambulates with shuffled steps, decreased step length,  flexed posture; had significant difficulty turning requiring extra time/effort and verbal/tactile cues  Stairs            Wheelchair Mobility     Tilt Bed    Modified Rankin (Stroke Patients Only)       Balance Overall balance assessment: Needs assistance Sitting-balance support: Bilateral upper extremity supported, Feet supported Sitting balance-Leahy Scale: Fair Sitting balance - Comments: initially has difficulty achieving midline sitting edge of bed. However with increased time, was able to achieve midline and hold sitting balance with supervision   Standing balance support: Single extremity supported Standing balance-Leahy Scale: Fair Standing balance comment: able to stand with 1 hand hold assist, progressing to standing without hand hold assist for short time with close stand by assist; requires at least 1 hand hold assist when shifting weight and stepping.               High Level Balance Comments: has difficulty turning requiring 2 hand hold assist, extra time/effort             Pertinent Vitals/Pain Pain Assessment Pain Assessment: 0-10 Pain Score: 7  Pain Location: reports pain in back and shoulders Pain Descriptors / Indicators: Aching, Sore Pain Intervention(s): Limited activity within patient's tolerance, Monitored during session, Repositioned    Home Living Family/patient expects to be discharged to:: Private residence Living Arrangements: Spouse/significant other Available Help at Discharge: Family;Available 24 hours/day Type of Home: House Home Access: Stairs to enter Entrance Stairs-Rails: None (working on getting one added) Secretary/administrator of Steps: 2   Home Layout: Able to live on main level with bedroom/bathroom Home Equipment: Shower seat;Grab bars - tub/shower      Prior Function Prior Level of Function : Needs assist             Mobility Comments: he needs assistance for bed mobility, occasional assistance for  transfers. Can walk in home unassisted. Did not use any assistive device. ADLs Comments: wife helps with all ADLs including bathing and dressing; she would help feed him     Extremity/Trunk Assessment   Upper Extremity Assessment Upper Extremity Assessment: Overall WFL for tasks assessed    Lower Extremity Assessment Lower Extremity Assessment: Generalized weakness;Overall [facility] for tasks assessed    Cervical / Trunk Assessment Cervical / Trunk Assessment: Kyphotic  Communication   Communication Factors Affecting Communication: Reduced clarity of speech    Cognition Arousal: Lethargic Behavior During Therapy: Flat affect                           PT - Cognition Comments: oriented to situation and place; Following commands: Intact       Cueing Cueing Techniques: Verbal cues     General Comments General comments (skin integrity, edema, etc.): no swelling noted; during session, vitals monitored, SPO2 >95% while on room air during activity; at end of session, therapist was helping patient change his gown (it was wet), and noticed IV had come out of left hand. RN notified. She was in room replacing when therapist left room.    Exercises Other Exercises Other Exercises: patient/wife educated on role of PT. He has tried home health therapy in the past with mixed results. PT educated patient/wife on various Parkinson's programs  including [employer] and [facility] (Parkinson's wellness recovery). Also recommended they reach out to [employer] (Parkinson's boxing) for support group if interested. Other Exercises: Patient instructed in bed mobility exercise prior to sitting edge of bed to help improve mobility, see above (x15 min)   Assessment/Plan    PT Assessment Patient needs continued PT services  PT Problem List Decreased strength;Decreased cognition;Decreased activity tolerance;Decreased balance;Decreased safety awareness;Decreased mobility       PT Treatment  Interventions Therapeutic exercise;Gait training;Balance training;Stair training;Functional mobility training;Therapeutic activities;Patient/family education    PT Goals (Current goals can be found in the Care Plan section)  Acute Rehab PT Goals Patient Stated Goal: to go home and get stronger PT Goal Formulation: With patient/family Time For Goal Achievement: 06/14/24 Potential to Achieve Goals: Good    Frequency Min 1X/week     Co-evaluation               AM-PAC PT 6 Clicks Mobility  Outcome Measure Help needed turning from your back to your side while in a flat bed without using bedrails?: A Lot Help needed moving from lying on your back to sitting on the side of a flat bed without using bedrails?: A Lot Help needed moving to and from a bed to a chair (including a wheelchair)?: A Little Help needed standing up from a chair using your arms (e.g., wheelchair or bedside chair)?: A Little Help needed to walk in hospital room?: A Little Help needed climbing 3-5 steps with a railing? : A Lot 6 Click Score: 15    End of Session   Activity Tolerance: Patient tolerated treatment well;Patient limited by fatigue Patient left: in bed;with call bell/phone within reach;with bed alarm set;with nursing/sitter in room;with family/visitor present Nurse Communication: Mobility status PT Visit Diagnosis: Other abnormalities of gait and mobility (R26.89);Muscle weakness (generalized) (M62.81);History of falling (Z91.81)    Time: 1005-1130 PT Time Calculation (min) (ACUTE ONLY): 85 min   Charges:   PT Evaluation $PT Eval Moderate Complexity: 1 Mod PT Treatments $Therapeutic Exercise: 8-22 mins $Therapeutic Activity: 8-22 mins PT General Charges $$ ACUTE PT VISIT: 1 Visit           Donatello Kleve PT, DPT 05/31/2024, 11:58 AM

## 2024-05-31 NOTE — TOC Initial Note (Addendum)
 Transition of Care [facility]) - Initial/Assessment Note    Patient Details  Name: [name] [name] MRN: [id number] Date of Birth: Feb 03, 1961  [facility] CM/SW Contact:    Delphine KANDICE Bring, RN Phone Number: 05/31/2024, 11:48 AM  Clinical Narrative:                 Wife at bedside. Nurse is drawing labs. PT just completed their assessment.PT states they recommend HH. PT gave wife names of programs/support group for Parkinson disease. She states that patient does not use DME at home but have grab bars, use special plate/silverware due to his parkinson disease. Wife assist with his ADLs Wife states that patient had HH about 5 months ago. She could not remember the name of the company but his PCP had arranged it. Wife plan to take patient home. She is in agreement with the [facility] agency in the past.CM verified address and phone number.   After chart check, patient had [facility] in past. CM will send referral.  Expected Discharge Plan: Home w Home Health Services Barriers to Discharge: No Barriers Identified   Patient Goals and CMS Choice            Expected Discharge Plan and Services       Living arrangements for the past 2 months: Independent Living Facility                                      Prior Living Arrangements/Services Living arrangements for the past 2 months: Independent Living Facility Lives with:: Spouse Patient language and need for interpreter reviewed:: No (English) Do you feel safe going back to the place where you live?: Yes               Activities of Daily Living   ADL Screening (condition at time of admission) Independently performs ADLs?: No Does the patient have a NEW difficulty with bathing/dressing/toileting/self-feeding that is expected to last >3 days?: No Does the patient have a NEW difficulty with getting in/out of bed, walking, or climbing stairs that is expected to last >3 days?: No Does the patient have a NEW difficulty  with communication that is expected to last >3 days?: No Is the patient deaf or have difficulty hearing?: Yes Does the patient have difficulty seeing, even when wearing glasses/contacts?: No Does the patient have difficulty concentrating, remembering, or making decisions?: No  Permission Sought/Granted                  Emotional Assessment Appearance:: Appears stated age   Affect (typically observed): Calm Orientation: : Oriented to Self, Oriented to Place, Oriented to  Time, Oriented to Situation      Admission diagnosis:  Hypoxia [R09.02] Multifocal pneumonia [J18.8] Acute hypoxic respiratory failure (HCC) [J96.01] Patient Active Problem List   Diagnosis Date Noted   Acute hypoxic respiratory failure (HCC) 05/30/2024   Gross hematuria 01/04/2024   Right shoulder pain 09/11/2023   Psychosis (HCC) 11/06/2022   Cervical radiculopathy 10/20/2022   Preventative health care 10/28/2021   Mild neurocognitive disorder with Lewy bodies, possible 05/03/2021   Episodes of formed visual hallucinations    REM sleep behaviors    Syringomyelia 09/08/2019   Alcohol use disorder 09/08/2019   Parkinson's disease (HCC) 04/27/2016   Tremor of right hand 06/21/2015   Herniated disc, cervical    Hyperlipidemia 09/18/2007   Essential hypertension, benign 01/31/2007  PCP:  Pcp, No Pharmacy:   [facility] [place] [place] [place] [address] [place] [address] Phone: [phone] Fax: [phone]  [facility] [place]  - [place], [place] - [address] [facility] [address] [place] [address] Phone: [phone] Fax: [phone]  [facility] - [place], [place] - [address] [place] [address] Phone: [phone] Fax: [phone]     Social Drivers of Health (SDOH) Social History: SDOH Screenings   Food Insecurity: No Food Insecurity (05/30/2024)  Housing: Low Risk  (05/30/2024)  Transportation Needs: No Transportation Needs (05/30/2024)  Utilities: Not At Risk (05/30/2024)  Depression (PHQ2-9): High Risk (01/04/2024)  Social Connections: Unknown (05/30/2024)  Tobacco Use: Medium Risk (05/30/2024)   SDOH Interventions:     Readmission Risk Interventions     No data to display

## 2024-05-31 NOTE — Evaluation (Signed)
 Clinical/Bedside Swallow Evaluation Patient Details  Name: [name] [name] MRN: [id number] Date of Birth: [birth year]  Today's Date: 05/31/2024 Time: SLP Start Time (ACUTE ONLY): 0845 SLP Stop Time (ACUTE ONLY): 0930 SLP Time Calculation (min) (ACUTE ONLY): 45 min  Past Medical History:  Past Medical History:  Diagnosis Date   Alcohol abuse 09/08/2019   Episodes of formed visual hallucinations    Essential hypertension, benign 01/31/2007   Herniated disc, cervical    History of skin cancer    basal cell; face and abdomen   Hyperlipidemia    Mild neurocognitive disorder with Lewy bodies, possible 05/03/2021   Parkinson's disease 04/27/2016   REM sleep behaviors    Syringomyelia 09/08/2019   Tremor of right hand 06/21/2015   Past Surgical History:  Past Surgical History:  Procedure Laterality Date   BASAL CELL CARCINOMA EXCISION     EYE SURGERY     as a child   FINGER FRACTURE SURGERY     5th digit, left hand, x2   HERNIA REPAIR     age 42   HPI:  Per H&P, [name] [name] is a 64 y.o. year old male with medical history of HTN, HLD, Parkinson's disease, MCI presenting to the ED with fever and confusion. Patient is alert and oriented on my evaluation. He has difficulty with recall and wife states he does have dementia that waxes and wanes. She states patient has been having worsening cough over the last week. Chest CT: Multiple ill-defined patchy airspace opacities are noted in right lower lobe concerning for multifocal pneumonia.    Assessment / Plan / Recommendation  Clinical Impression  Pt seen for bedside swallow assessment in the setting of concern for aspiration PNA. No reported history of dysphagia indicated in chart review or reported by spouse. However, pt with increased risk of aspiration/dysphagia in the setting of MCI/dementia and Parkinson's disease with additional risk for acute deconditioning in hospital setting.   At time of assessment, pt on room air, with O2  saturations maintained 92-96 for duration of session. Oral motor function grossly intact. Congested cough noted at baseline in the absence and presence of PO intake. Pt requires assist for PO intake at baseline. Pt seen with trials of thin liquids (via straw), puree, and regular solids. No overt or subtle s/sx pharyngeal dysphagia noted. No change to vocal quality across trials. Vitals stable for duration of trials. Oral phase grossly intact- with complete manipulation and clearance of regular solid from oral cavity.   Based on current debility and chronic factors (dependency with PO intake, Parkinson's disease, dementia), pt is at increased risk of aspiration. Therefore, recommend aspiration precautions (slow rate, small bites, elevated HOB, and alert for PO intake). Recommend unrestricted diet. Education shared with spouse regarding aspiration risk and precautions. Further- SLP recommended close monitoring for recurrence in PNA/increase coughing with PO to determine need for instrumental assessment of swallow function. Spouse reported understanding. MD and RN aware of recommendations. SLP will monitor peripherally- no additional acute SLP services expected.   SLP Visit Diagnosis: Dysphagia, unspecified (R13.10) (suspect related to acute respiratory/pulmonary compromise and chronic condiditons (dementia and Parkinson's))    Aspiration Risk  Moderate aspiration risk    Diet Recommendation   Thin;Age appropriate regular  Medication Administration: Whole meds with liquid    Other Recommendations Oral Care Recommendations: Oral care BID;Staff/trained caregiver to provide oral care      Functional Status Assessment Patient has not had a recent decline in their functional status  Swallow Study   General Date of Onset: 05/31/24 HPI: Per H&P, [name] [name] is a 64 y.o. year old male with medical history of HTN, HLD, Parkinson's disease, MCI presenting to the ED with fever and confusion. Patient  is alert and oriented on my evaluation. He has difficulty with recall and wife states he does have dementia that waxes and wanes. She states patient has been having worsening cough over the last week. Chest CT: Multiple ill-defined patchy airspace opacities are noted in right lower lobe concerning for multifocal pneumonia. Type of Study: Bedside Swallow Evaluation Previous Swallow Assessment: none in chart Diet Prior to this Study: NPO Temperature Spikes Noted: Yes (101; WBC 7.3) Respiratory Status: Room air (initally on O2 supplementation- now on room air) History of Recent Intubation: No Behavior/Cognition: Alert;Cooperative;Confused Oral Cavity Assessment: Within Functional Limits Oral Care Completed by SLP: Recent completion by staff Oral Cavity - Dentition: Adequate natural dentition Vision: Functional for self-feeding Self-Feeding Abilities: Needs assist (spouse reports assist at baseline) Patient Positioning: Upright in bed Baseline Vocal Quality: Low vocal intensity Volitional Cough: Congested Volitional Swallow: Unable to elicit    Oral/Motor/Sensory Function Overall Oral Motor/Sensory Function: Within functional limits   Ice Chips Ice chips: Within functional limits Presentation: Spoon   Thin Liquid Thin Liquid: Within functional limits Presentation: Cup;Straw    Nectar Thick Nectar Thick Liquid: Not tested   Honey Thick Honey Thick Liquid: Not tested   Puree Puree: Within functional limits Presentation: Spoon   Solid     Solid: Within functional limits     Orin Eberwein Clapp, MS, CCC-SLP Speech Language Pathologist Rehab Services; [facility] [phone] (ascom)   Janes Colegrove J Clapp 05/31/2024,10:38 AM

## 2024-06-01 DIAGNOSIS — J9601 Acute respiratory failure with hypoxia: Secondary | ICD-10-CM | POA: Diagnosis not present

## 2024-06-01 LAB — CBC
HCT: 36.4 % — ABNORMAL LOW (ref 39.0–52.0)
Hemoglobin: 12.5 g/dL — ABNORMAL LOW (ref 13.0–17.0)
MCH: 32.3 pg (ref 26.0–34.0)
MCHC: 34.3 g/dL (ref 30.0–36.0)
MCV: 94.1 fL (ref 80.0–100.0)
Platelets: 116 K/uL — ABNORMAL LOW (ref 150–400)
RBC: 3.87 MIL/uL — ABNORMAL LOW (ref 4.22–5.81)
RDW: 12 % (ref 11.5–15.5)
WBC: 6.1 K/uL (ref 4.0–10.5)
nRBC: 0 % (ref 0.0–0.2)

## 2024-06-01 LAB — BASIC METABOLIC PANEL WITH GFR
Anion gap: 10 (ref 5–15)
BUN: 13 mg/dL (ref 8–23)
CO2: 26 mmol/L (ref 22–32)
Calcium: 8.1 mg/dL — ABNORMAL LOW (ref 8.9–10.3)
Chloride: 106 mmol/L (ref 98–111)
Creatinine, Ser: 0.81 mg/dL (ref 0.61–1.24)
GFR, Estimated: >60 mL/min
Glucose, Bld: 84 mg/dL (ref 70–99)
Potassium: 3.9 mmol/L (ref 3.5–5.1)
Sodium: 143 mmol/L (ref 135–145)

## 2024-06-01 LAB — GLUCOSE, CAPILLARY: Glucose-Capillary: 89 mg/dL (ref 70–99)

## 2024-06-01 LAB — MAGNESIUM: Magnesium: 2.1 mg/dL (ref 1.7–2.4)

## 2024-06-01 LAB — PHOSPHORUS: Phosphorus: 2.7 mg/dL (ref 2.5–4.6)

## 2024-06-01 MED ORDER — LOSARTAN POTASSIUM 50 MG PO TABS
50.0000 mg | ORAL_TABLET | Freq: Every day | ORAL | Status: DC
  Administered 2024-06-01 – 2024-06-02 (×2): 50 mg via ORAL
  Filled 2024-06-01 (×2): qty 1

## 2024-06-01 NOTE — Progress Notes (Signed)
 [facility] Progress Note  Patient: [name] [name]    FMW:[id number]  DOA: 05/30/2024     Date of Service: the patient was seen and examined on 06/01/2024  Chief Complaint  Patient presents with   Fever   Brief hospital course:  [name] [name] is a 64 y.o. year old male with medical history of HTN, HLD, Parkinson's disease, MCI presenting to the ED with fever and confusion.  Patient is alert and oriented on my evaluation.  He has difficulty with recall and wife states he does have dementia that waxes and wanes.  She states patient has been having worsening cough over the last week.  Denies any sick contacts.  States patient felt very hot prompting him to bring him to the ED for evaluation.  Patient denying any other URI symptoms such for coughing.  Does report good appetite and no aspiration with eating.   ED w/up: Hemodynamically stable CBC without leukocytosis or anemia.  There is mild thrombocytopenia.  CMP with mild hypokalemia and mild hyperglycemia otherwise unremarkable.  Respiratory panel negative for COVID, flu, RSV.  Lactic acid normal.  UA without signs of infection.  Blood cultures obtained and patient given ceftriaxone  azithromycin .  Given need for continued care, [facility] contacted for admission.   Assessment and Plan:  # Acute hypoxic respiratory failure secondary to pneumonia. CT scan shows multifocal right lower lobe pneumonia SLP eval done, recommended regular diet with thin liquids, moderate risk of aspiration Continue aspiration precautions Continue ceftriaxone  azithromycin  Mucinex  600 mg p.o. twice daily Robitussin DM as needed Follow culture   # Parkinson's disease: Patient is on foscarbidopa-foslevodopa via pump infusion.  Per wife patient frequently missed this medication prompting neurology to use this continuous infusion.  She states she will bring this medication later.    # Hypertension:  Holding home antihypertensive given active infection and borderline  hypotensive blood pressures.   # Hyperlipidemia: Not on any pharmacotherapy.  LDL 77, well controlled  # Iron  deficiency: Tsat 11%, avoided IV iron  due to active infection Started oral iron  supplement vitamin C  Follow-up with PCP to repeat iron  profile in between 3 to 6 months  # Folic acid  deficiency: folic acid  level 3.7, started folic acid  1 mg p.o. daily.  Follow-up with PCP to repeat folic acid  level in between 3 to 6 months.  # Vitamin D  deficiency: started vitamin D  50,000 units p.o. weekly, follow with PCP to repeat vitamin D  level after 3 to 6 months.     Body mass index is 32.29 kg/m.  Interventions:  Diet: Regular diet DVT Prophylaxis: Subcutaneous Heparin     Advance goals of care discussion: Full code  Family Communication: family was present at bedside, at the time of interview.  The pt provided permission to discuss medical plan with the family. Opportunity was given to ask question and all questions were answered satisfactorily.  1/3 management plan discussed with patient's wife at bedside  Disposition:  Pt is from Home, admitted with PNA, still on IV antibiotics, which precludes a safe discharge. Discharge to home versus SNF, TBD after PT and OT eval, when stable, may need few days to improve.  Subjective: No significant events overnight.  Patient is feeling improvement today, still has mild cough and phlegm production.  More awake and alert today, denied any other active issues   Physical Exam: General: NAD, lying comfortably Appear in no distress, affect appropriate Eyes: PERRLA ENT: Oral Mucosa Clear, moist  Neck: no JVD,  Cardiovascular: S1 and S2  Present, no Murmur,  Respiratory: Equal air entry bilaterally, bilateral crackles, no significant wheezes  Abdomen: Bowel Sound present, Soft and no tenderness,  Skin: no rashes Extremities: no Pedal edema, no calf tenderness Neurologic: without any new focal findings Gait not checked due to patient safety  concerns  Vitals:   06/01/24 0316 06/01/24 0516 06/01/24 0819 06/01/24 1143  BP: (!) 107/55 (!) 151/86 (!) 152/67 125/70  Pulse: (!) 44 66 (!) 50 (!) 56  Resp:   17 17  Temp: 99 F (37.2 C) 98 F (36.7 C) 98.5 F (36.9 C) 98.8 F (37.1 C)  TempSrc: Oral Oral Oral   SpO2: 97% 97% 95% 96%  Weight: 105 kg     Height:        Intake/Output Summary (Last 24 hours) at 06/01/2024 1228 Last data filed at 06/01/2024 0900 Gross per 24 hour  Intake 710 ml  Output 700 ml  Net 10 ml   Filed Weights   05/30/24 0933 05/31/24 0459 06/01/24 0316  Weight: 101.6 kg 104.1 kg 105 kg    Data Reviewed: I have personally reviewed and interpreted daily labs, tele strips, imagings as discussed above. I reviewed all nursing notes, pharmacy notes, vitals, pertinent old records I have discussed plan of care as described above with RN and patient/family.  CBC: Recent Labs  Lab 05/30/24 0939 05/31/24 0543 06/01/24 0435  WBC 9.2 7.3 6.1  HGB 14.3 12.6* 12.5*  HCT 40.4 37.7* 36.4*  MCV 92.4 95.2 94.1  PLT 136* 119* 116*   Basic Metabolic Panel: Recent Labs  Lab 05/30/24 0939 05/30/24 1400 05/31/24 0543 05/31/24 1115 06/01/24 0435  NA 140  --  139  --  143  K 3.2*  --  3.1*  --  3.9  CL 102  --  104  --  106  CO2 25  --  26  --  26  GLUCOSE 134*  --  102*  --  84  BUN 18  --  15  --  13  CREATININE 0.82  --  0.67  --  0.81  CALCIUM 9.3  --  8.5*  --  8.1*  MG  --  1.6*  --  2.3 2.1  PHOS  --   --   --  2.4* 2.7    Studies: No results found.  Scheduled Meds:  vitamin C   500 mg Oral Daily   aspirin  EC  81 mg Oral Daily   cyanocobalamin   1,000 mcg Oral Daily   folic acid   1 mg Oral Daily   Foscarbidopa-Foslevodopa  10 mL Subcutaneous Daily   guaiFENesin   600 mg Oral BID   heparin   5,000 Units Subcutaneous Q8H   iron  polysaccharides  150 mg Oral Daily   losartan   50 mg Oral Daily   melatonin  5 mg Oral QHS   Pimavanserin  Tartrate  34 mg Oral Daily   QUEtiapine   25 mg Oral BID    QUEtiapine   50 mg Oral QHS   sodium chloride  flush  3 mL Intravenous Q12H   tamsulosin   0.4 mg Oral QPC breakfast   Vitamin D  (Ergocalciferol )  50,000 Units Oral Q7 days   Continuous Infusions:  cefTRIAXone  (ROCEPHIN )  IV 2 g (06/01/24 1022)   PRN Meds: acetaminophen  **OR** acetaminophen , guaiFENesin -dextromethorphan , ondansetron  **OR** ondansetron  (ZOFRAN ) IV, mouth rinse, senna-docusate  Time spent: 40 minutes  Author: ELVAN SOR. MD [facility] 06/01/2024 12:28 PM  To reach On-call, see care teams to locate the attending and reach out to  them via [email or web address]. If 7PM-7AM, please contact night-coverage If you still have difficulty reaching the attending provider, please page the [facility] (Director on Call) for [facility] on amion for assistance.

## 2024-06-01 NOTE — Plan of Care (Signed)

## 2024-06-02 ENCOUNTER — Other Ambulatory Visit: Payer: Self-pay

## 2024-06-02 DIAGNOSIS — J9601 Acute respiratory failure with hypoxia: Secondary | ICD-10-CM | POA: Diagnosis not present

## 2024-06-02 LAB — GLUCOSE, CAPILLARY: Glucose-Capillary: 92 mg/dL (ref 70–99)

## 2024-06-02 MED ORDER — AMOXICILLIN-POT CLAVULANATE 875-125 MG PO TABS
1.0000 | ORAL_TABLET | Freq: Two times a day (BID) | ORAL | 0 refills | Status: AC
  Filled 2024-06-02: qty 10, 5d supply, fill #0

## 2024-06-02 MED ORDER — HYDRALAZINE HCL 20 MG/ML IJ SOLN: 10.0000 mg | Freq: Four times a day (QID) | INTRAMUSCULAR | Status: DC | PRN

## 2024-06-02 MED ORDER — ASCORBIC ACID 500 MG PO TABS
500.0000 mg | ORAL_TABLET | Freq: Every day | ORAL | 1 refills | Status: AC
  Filled 2024-06-02: qty 100, 100d supply, fill #0

## 2024-06-02 MED ORDER — GUAIFENESIN ER 600 MG PO TB12
600.0000 mg | ORAL_TABLET | Freq: Two times a day (BID) | ORAL | 0 refills | Status: AC
  Filled 2024-06-02: qty 10, 5d supply, fill #0

## 2024-06-02 MED ORDER — POLYSACCHARIDE IRON COMPLEX 150 MG PO CAPS
150.0000 mg | ORAL_CAPSULE | Freq: Every day | ORAL | 1 refills | Status: AC
  Filled 2024-06-02: qty 90, 90d supply, fill #0

## 2024-06-02 MED ORDER — DEXTROMETHORPHAN-GUAIFENESIN 20-200 MG/20ML PO LIQD
10.0000 mL | Freq: Four times a day (QID) | ORAL | 0 refills | Status: AC | PRN
  Filled 2024-06-02: qty 118, 7d supply, fill #0

## 2024-06-02 MED ORDER — FOLIC ACID 1 MG PO TABS
1.0000 mg | ORAL_TABLET | Freq: Every day | ORAL | 1 refills | Status: AC
  Filled 2024-06-02: qty 90, 90d supply, fill #0

## 2024-06-02 MED ORDER — VITAMIN D (ERGOCALCIFEROL) 1.25 MG (50000 UNIT) PO CAPS
50000.0000 [IU] | ORAL_CAPSULE | ORAL | 0 refills | Status: AC
  Filled 2024-06-02: qty 12, 84d supply, fill #0

## 2024-06-02 MED ORDER — HYDRALAZINE HCL 50 MG PO TABS: 50.0000 mg | ORAL_TABLET | Freq: Four times a day (QID) | ORAL | Status: DC | PRN

## 2024-06-02 NOTE — Discharge Summary (Signed)
 [facility] Progress Note  Patient: [name] [name]    FMW:[id number]  DOA: 05/30/2024     Date of Service: the patient was seen and examined on 06/02/2024  Chief Complaint  Patient presents with   Fever   Brief hospital course: *** Currently further plan is ***.  Assessment and Plan: 1. *** ***  2. *** ***  3. *** ***  4. *** ***  5. *** ***  6. *** ***  Body mass index is 32.56 kg/m.    Interventions:        Diet: *** DVT Prophylaxis: {DVTprophylaxisprnv:22509}   Advance goals of care discussion: {Palliative Code status:23503}  Family Communication: ***family was present at bedside, at the time of interview.  ***The pt provided permission to discuss medical plan with the family. Opportunity was given to ask question and all questions were answered satisfactorily.   Disposition:  Pt is from ***, admitted with ***, still has ***, which precludes a safe discharge. Discharge to ***, when ***.  Subjective: ****  Physical Exam: General: NAD, lying comfortably Appear in no distress, affect appropriate Eyes: PERRLA ENT: Oral Mucosa Clear, moist  Neck: no JVD,  Cardiovascular: S1 and S2 Present, no Murmur,  Respiratory: good respiratory effort, Bilateral Air entry equal and Decreased, no Crackles, no wheezes Abdomen: Bowel Sound present, Soft and no tenderness,  Skin: no rashes Extremities: no Pedal edema, no calf tenderness Neurologic: without any new focal findings Gait not checked due to patient safety concerns  Vitals:   06/01/24 2343 06/02/24 0403 06/02/24 0500 06/02/24 0733  BP: (!) 146/76 (!) 150/87  (!) 162/90  Pulse: (!) 47 (!) 45  (!) 51  Resp:  17  18  Temp: 98 F (36.7 C) 97.7 F (36.5 C)  (!) 97.5 F (36.4 C)  TempSrc: Oral Oral  Oral  SpO2: 97% 98%  96%  Weight:   105.9 kg   Height:        Intake/Output Summary (Last 24 hours) at 06/02/2024 1012 Last data filed at 06/02/2024 0403 Gross per 24 hour  Intake 420 ml  Output 800  ml  Net -380 ml   Filed Weights   05/31/24 0459 06/01/24 0316 06/02/24 0500  Weight: 104.1 kg 105 kg 105.9 kg    Data Reviewed: I have personally reviewed and interpreted daily labs, tele strips, imagings as discussed above. I reviewed all nursing notes, pharmacy notes, vitals, pertinent old records I have discussed plan of care as described above with RN and patient/family.  CBC: Recent Labs  Lab 05/30/24 0939 05/31/24 0543 06/01/24 0435  WBC 9.2 7.3 6.1  HGB 14.3 12.6* 12.5*  HCT 40.4 37.7* 36.4*  MCV 92.4 95.2 94.1  PLT 136* 119* 116*   Basic Metabolic Panel: Recent Labs  Lab 05/30/24 0939 05/30/24 1400 05/31/24 0543 05/31/24 1115 06/01/24 0435  NA 140  --  139  --  143  K 3.2*  --  3.1*  --  3.9  CL 102  --  104  --  106  CO2 25  --  26  --  26  GLUCOSE 134*  --  102*  --  84  BUN 18  --  15  --  13  CREATININE 0.82  --  0.67  --  0.81  CALCIUM 9.3  --  8.5*  --  8.1*  MG  --  1.6*  --  2.3 2.1  PHOS  --   --   --  2.4* 2.7  Studies: No results found.  Scheduled Meds:  vitamin C   500 mg Oral Daily   aspirin  EC  81 mg Oral Daily   cyanocobalamin   1,000 mcg Oral Daily   folic acid   1 mg Oral Daily   Foscarbidopa-Foslevodopa  10 mL Subcutaneous Daily   guaiFENesin   600 mg Oral BID   heparin   5,000 Units Subcutaneous Q8H   iron  polysaccharides  150 mg Oral Daily   losartan   50 mg Oral Daily   melatonin  5 mg Oral QHS   Pimavanserin  Tartrate  34 mg Oral Daily   QUEtiapine   25 mg Oral BID   QUEtiapine   50 mg Oral QHS   sodium chloride  flush  3 mL Intravenous Q12H   tamsulosin   0.4 mg Oral QPC breakfast   Vitamin D  (Ergocalciferol )  50,000 Units Oral Q7 days   Continuous Infusions:  cefTRIAXone  (ROCEPHIN )  IV 2 g (06/02/24 0858)   PRN Meds: acetaminophen  **OR** acetaminophen , guaiFENesin -dextromethorphan , hydrALAZINE  **OR** hydrALAZINE , ondansetron  **OR** ondansetron  (ZOFRAN ) IV, mouth rinse, senna-docusate  Time spent: 35  minutes  Author: ELVAN SOR. MD [facility] 06/02/2024 10:12 AM  To reach On-call, see care teams to locate the attending and reach out to them via [email or web address]. If 7PM-7AM, please contact night-coverage If you still have difficulty reaching the attending provider, please page the [facility] (Director on Call) for [facility] on amion for assistance.

## 2024-06-02 NOTE — TOC Transition Note (Signed)
 Transition of Care [facility]) - Discharge Note   Patient Details  Name: [name] [name] MRN: [id number] Date of Birth: 03-27-1961  Transition of [facility] CM/SW Contact:  Dalia GORMAN Fuse, RN Phone Number: 06/02/2024, 12:49 PM   Clinical Narrative:     Patient is medically ready to discharge to home. Patient was active with [facility] in the past and verbalized to weekend [facility] that he would like to use the agency. [facility] accepted in the hub.  No other TOC needs identified.  Final next level of care: Home w Home Health Services Barriers to Discharge: Barriers Resolved   Patient Goals and CMS Choice            Discharge Placement                       Discharge Plan and Services Additional resources added to the After Visit Summary for                            [facility] Arranged: PT HH Agency: [facility] Date [facility] Agency Contacted: 06/02/24 Time HH Agency Contacted: [phone]    Social Drivers of Health (SDOH) Interventions SDOH Screenings   Food Insecurity: No Food Insecurity (05/30/2024)  Housing: Low Risk (05/30/2024)  Transportation Needs: No Transportation Needs (05/30/2024)  Utilities: Not At Risk (05/30/2024)  Depression (PHQ2-9): High Risk (01/04/2024)  Social Connections: Unknown (05/30/2024)  Tobacco Use: Medium Risk (05/30/2024)     Readmission Risk Interventions     No data to display

## 2024-06-02 NOTE — TOC Progression Note (Signed)
 Transition of Care [facility]) - Progression Note    Patient Details  Name: [name] [name] MRN: [id number] Date of Birth: 1960-12-06  [facility] CM/SW Contact  Dalia GORMAN Fuse, RN Phone Number: 06/02/2024, 8:38 AM  Clinical Narrative:     Patient is from home with his wife. He remains inpatient with PNA. He continues to receive IV abx. The plan is for the patient to discharge to home with [facility] PT/OT. Referral sent to [facility] in the hub and is pending. CM sent secure message to [place]  to ensure she saw the referral.   Expected Discharge Plan: Home w Home Health Services Barriers to Discharge: No Barriers Identified               Expected Discharge Plan and Services       Living arrangements for the past 2 months: Independent Living Facility                                       Social Drivers of Health (SDOH) Interventions SDOH Screenings   Food Insecurity: No Food Insecurity (05/30/2024)  Housing: Low Risk (05/30/2024)  Transportation Needs: No Transportation Needs (05/30/2024)  Utilities: Not At Risk (05/30/2024)  Depression (PHQ2-9): High Risk (01/04/2024)  Social Connections: Unknown (05/30/2024)  Tobacco Use: Medium Risk (05/30/2024)    Readmission Risk Interventions     No data to display

## 2024-06-04 ENCOUNTER — Telehealth: Payer: Self-pay | Admitting: Neurology

## 2024-06-04 LAB — CULTURE, BLOOD (SINGLE)
Culture: NO GROWTH
Culture: NO GROWTH
Special Requests: ADEQUATE
Special Requests: ADEQUATE

## 2024-06-04 NOTE — Telephone Encounter (Signed)
 Verbal PT orders Phys Therapy 1wk1 2wk1 1wk4 , secure VM

## 2024-06-04 NOTE — Telephone Encounter (Signed)
 Called and left verbal orders on voicemail for [employer]

## 2024-06-06 ENCOUNTER — Encounter: Payer: Self-pay | Admitting: Primary Care

## 2024-06-06 ENCOUNTER — Ambulatory Visit: Payer: Self-pay | Admitting: Primary Care

## 2024-06-06 ENCOUNTER — Ambulatory Visit (INDEPENDENT_AMBULATORY_CARE_PROVIDER_SITE_OTHER)
Admission: RE | Admit: 2024-06-06 | Discharge: 2024-06-06 | Disposition: A | Source: Ambulatory Visit | Attending: Primary Care

## 2024-06-06 ENCOUNTER — Ambulatory Visit: Admitting: Primary Care

## 2024-06-06 VITALS — BP 116/68 | HR 69 | Temp 97.9°F | Ht 71.0 in | Wt 223.0 lb

## 2024-06-06 DIAGNOSIS — J189 Pneumonia, unspecified organism: Secondary | ICD-10-CM

## 2024-06-06 DIAGNOSIS — R051 Acute cough: Secondary | ICD-10-CM | POA: Diagnosis not present

## 2024-06-06 LAB — POC COVID19 BINAXNOW: SARS Coronavirus 2 Ag: NEGATIVE

## 2024-06-06 LAB — POCT INFLUENZA A/B
Influenza A, POC: NEGATIVE
Influenza B, POC: NEGATIVE

## 2024-06-06 MED ORDER — PREDNISONE 20 MG PO TABS: ORAL_TABLET | ORAL | 0 refills | Status: AC

## 2024-06-06 MED ORDER — HYDROCOD POLI-CHLORPHE POLI ER 10-8 MG/5ML PO SUER: 5.0000 mL | Freq: Two times a day (BID) | ORAL | 0 refills | Status: AC | PRN

## 2024-06-06 NOTE — Progress Notes (Addendum)
 "  Subjective:    Patient ID: [name] [name], male    DOB: 03-28-1961, 64 y.o.   MRN: [id number]  [name] [name] is a very pleasant 64 y.o. male patient of Dr. Jimmy with a history of acute hypoxic respiratory failure, Parkinson's disease, alcohol use disorder, psychosis who presents today to discuss acute cough.  His wife joins us  today who helps with HPI.  He presented to [facility] ED on 05/30/2024 for generalized fatigue, weakness, fevers, cough.  His family found him confused.  Workup in the ED revealed multifocal pneumonia to the right lobe.  CBC without leukocytosis.  He was noted to be hypoxic with 88% on room air.  He is admitted for further evaluation.  During his hospital stay he was treated with antibiotics (ceftriaxone  and azithromycin ).  He required supplemental oxygen at 2 L/min per nasal cannula.  He was initiated on oral iron  supplement due to low iron .  His symptoms improved and he was able to wean off of oxygen so he was discharged home on 06/02/2024 with recommendation for PCP follow-up, repeat iron  profile/folic acid /vitamin D  in 3 to 6 months, repeat chest x-ray in 4 weeks.  He was discharged home with Augmentin  twice daily x 5 days.  Since his hospital stay he's feeling worse. His wife believes he's getting worse. He's sleeping a lot, cough persistently, generalized weakness, shortness of breath, intermittent fevers of 99-100 with clammy and diaphoresis.  He is compliant to his Augmentin  as prescribed.  He has not taken Tylenol  or ibuprofen recently.   Review of Systems  Constitutional:  Positive for fatigue and fever.  HENT:  Positive for congestion.   Respiratory:  Positive for cough.   Neurological:  Positive for weakness.         Past Medical History:  Diagnosis Date   Alcohol abuse 09/08/2019   Episodes of formed visual hallucinations    Essential hypertension, benign 01/31/2007   Herniated disc, cervical    History of skin cancer    basal cell; face and abdomen    Hyperlipidemia    Mild neurocognitive disorder with Lewy bodies, possible 05/03/2021   Parkinson's disease 04/27/2016   REM sleep behaviors    Syringomyelia 09/08/2019   Tremor of right hand 06/21/2015    Social History   Socioeconomic History   Marital status: Married    Spouse name: Not on file   Number of children: 1   Years of education: 12   Highest education level: High school graduate  Occupational History   Occupation: Project manager--now disabled    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  Tobacco Use   Smoking status: Never    Passive exposure: Past (as a child)   Smokeless tobacco: Former   Tobacco comments:    Dip Only stop in many years ago  Vaping Use   Vaping status: Never Used  Substance and Sexual Activity   Alcohol use: Yes    Comment: 1-2  a night   Drug use: No   Sexual activity: Not on file  Other Topics Concern   Not on file  Social History Narrative   Family history of MI and CVA   HSG   Married '83-divorced 13 years; married '97-2 years divorced; married '03-3 years divorced   1 daughter '87   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   Right handed   Two story home      No living will   Wife should be decision maker--daughter also   Would accept  resuscitation   Not sure about feeding tube   Social Drivers of Health   Tobacco Use: Medium Risk (06/06/2024)   Patient History    Smoking Tobacco Use: Never    Smokeless Tobacco Use: Former    Passive Exposure: Past  Programmer, Applications: Not on file  Food Insecurity: No Food Insecurity (05/30/2024)   Epic    Worried About Programme Researcher, Broadcasting/film/video in the Last Year: Never true    Ran Out of Food in the Last Year: Never true  Transportation Needs: No Transportation Needs (05/30/2024)   Epic    Lack of Transportation (Medical): No    Lack of Transportation (Non-Medical): No  Physical Activity: Not on file  Stress: Not on file  Social Connections: Unknown (05/30/2024)    Social Connection and Isolation Panel    Frequency of Communication with Friends and Family: Once a week    Frequency of Social Gatherings with Friends and Family: Once a week    Attends Religious Services: Never    Database Administrator or Organizations: Not on file    Attends Banker Meetings: Never    Marital Status: Not on file  Intimate Partner Violence: Not At Risk (05/30/2024)   Epic    Fear of Current or Ex-Partner: No    Emotionally Abused: No    Physically Abused: No    Sexually Abused: No  Depression (PHQ2-9): High Risk (06/06/2024)   Depression (PHQ2-9)    PHQ-2 Score: 26  Alcohol Screen: Not on file  Housing: Low Risk (05/30/2024)   Epic    Unable to Pay for Housing in the Last Year: No    Number of Times Moved in the Last Year: 0    Homeless in the Last Year: No  Utilities: Not At Risk (05/30/2024)   Epic    Threatened with loss of utilities: No  Health Literacy: Not on file    Past Surgical History:  Procedure Laterality Date   BASAL CELL CARCINOMA EXCISION     EYE SURGERY     as a child   FINGER FRACTURE SURGERY     5th digit, left hand, x2   HERNIA REPAIR     age 37    Family History  Problem Relation Age of Onset   Hypertension Mother    Diabetes Father    Hypertension Father    Heart attack Father    Parkinson's disease Maternal Grandmother    Parkinsonism Maternal Grandmother     Allergies[1]  Medications Ordered Prior to Encounter[2]  BP 116/68   Pulse 69   Temp 97.9 F (36.6 C) (Oral)   Ht 5' 11 (1.803 m)   Wt 223 lb (101.2 kg)   SpO2 95%   BMI 31.10 kg/m  Objective:   Physical Exam Constitutional:      Appearance: He is ill-appearing.  HENT:     Right Ear: Tympanic membrane and ear canal normal.     Left Ear: Tympanic membrane and ear canal normal.     Nose: No mucosal edema.     Right Sinus: No maxillary sinus tenderness or frontal sinus tenderness.     Left Sinus: No maxillary sinus tenderness or frontal sinus  tenderness.     Mouth/Throat:     Mouth: Mucous membranes are moist.  Eyes:     Conjunctiva/sclera: Conjunctivae normal.  Cardiovascular:     Rate and Rhythm: Normal rate and regular rhythm.  Pulmonary:     Effort: Pulmonary effort  is normal.     Breath sounds: No wheezing or rhonchi.     Comments: Congested cough noted during exam Musculoskeletal:     Cervical back: Neck supple.  Skin:    General: Skin is warm and dry.     Physical Exam        Assessment & Plan:  Community acquired pneumonia of right lower lobe of lung Assessment & Plan: With recent hospitalization. Hospital notes, labs, imaging reviewed.  Respiratory exam today overall without congestion, crackles, wheezing. His oxygen saturation is slightly lower than normal but not as bad as when admitted.  Other vital signs are overall unremarkable.  He is afebrile without antiemetics.  Rapid influenza test negative. Rapid COVID-19 test negative.  Stat chest x-ray ordered and pending.  Start prednisone  20 mg tablets. Take 2 tablets by mouth once daily in the morning for 5 days. Start Tussionex cough suppressant. Take 5 ml every 12 hours as needed for cough and rest. Caution this medication contains codeine  which may cause drowsiness.  He has tolerated codeine  medication previously.  Strict ED precautions.     Orders: -     DG Chest 2 View -     predniSONE ; Take 2 tablets by mouth once daily in the morning for 5 days.  Dispense: 10 tablet; Refill: 0 -     Hydrocod Poli-Chlorphe Poli ER; Take 5 mLs by mouth every 12 (twelve) hours as needed for cough.  Dispense: 50 mL; Refill: 0  Acute cough -     POC COVID-19 BinaxNow -     POCT Influenza A/B    Assessment and Plan Assessment & Plan         Comer MARLA Gaskins, NP       [1]  Allergies Allergen Reactions   Hydrocodone Nausea Only  [2]  Current Outpatient Medications on File Prior to Visit  Medication Sig Dispense Refill    amoxicillin -clavulanate (AUGMENTIN ) 875-125 MG tablet Take 1 tablet by mouth 2 (two) times daily for 5 days. 10 tablet 0   ascorbic acid  (VITAMIN C ) 500 MG tablet Take 1 tablet (500 mg total) by mouth daily. 100 tablet 1   aspirin  EC 81 MG tablet Take 81 mg by mouth daily.     Dextromethorphan -guaiFENesin  20-200 MG/20ML LIQD Take 10 mLs by mouth every 6 (six) hours as needed. 118 mL 0   folic acid  (FOLVITE ) 1 MG tablet Take 1 tablet (1 mg total) by mouth daily. 90 tablet 1   Foscarbidopa-Foslevodopa 12-240 MG/ML SOLN Inject 12-240 mg into the skin daily.     guaiFENesin  (MUCINEX ) 600 MG 12 hr tablet Take 1 tablet (600 mg total) by mouth 2 (two) times daily for 5 days. 10 tablet 0   iron  polysaccharides (NIFEREX) 150 MG capsule Take 1 capsule (150 mg total) by mouth daily. 90 capsule 1   losartan -hydrochlorothiazide  (HYZAAR) 100-25 MG tablet TAKE ONE TABLET BY MOUTH DAILY. 90 tablet 3   Pimavanserin  Tartrate (NUPLAZID ) 34 MG CAPS TAKE 1 CAPSULE BY MOUTH 1 TIME A DAY 90 capsule 0   QUEtiapine  (SEROQUEL ) 25 MG tablet Take 2 tablets (50 mg total) by mouth 2 (two) times daily. (Patient taking differently: Take 25-50 mg by mouth 3 (three) times daily. Take 25 mg (one tablet) by mouth every morning and 25 mg (one tablet) at lunch. Take 50 mg (two tablets) in the evening after supper) 360 tablet 1   vitamin B-12 (CYANOCOBALAMIN ) 1000 MCG tablet Take 1,000 mcg by mouth daily.     [  START ON 06/07/2024] Vitamin D , Ergocalciferol , (DRISDOL ) 1.25 MG (50000 UNIT) CAPS capsule Take 1 capsule (50,000 Units total) by mouth every 7 (seven) days. 12 capsule 0   [DISCONTINUED] potassium chloride  (K-DUR) 10 MEQ tablet Take 1 tablet (10 mEq total) by mouth 2 (two) times daily. 60 tablet 2   No current facility-administered medications on file prior to visit.   "

## 2024-06-06 NOTE — Assessment & Plan Note (Addendum)
 With recent hospitalization. Hospital notes, labs, imaging reviewed.  Respiratory exam today overall without congestion, crackles, wheezing. His oxygen saturation is slightly lower than normal but not as bad as when admitted.  Other vital signs are overall unremarkable.  He is afebrile without antiemetics.  Rapid influenza test negative. Rapid COVID-19 test negative.  Stat chest x-ray ordered and pending.  Start prednisone  20 mg tablets. Take 2 tablets by mouth once daily in the morning for 5 days. Start Tussionex cough suppressant. Take 5 ml every 12 hours as needed for cough and rest. Caution this medication contains codeine  which may cause drowsiness.  He has tolerated codeine  medication previously.  Strict ED precautions.

## 2024-06-06 NOTE — Patient Instructions (Signed)
 Complete xray(s) prior to leaving today. I will notify you of your results once received.  Start Tussionex cough suppressant. Take 5 ml every 12 hours as needed for cough and rest. Caution this medication contains codeine  which may cause drowsiness.   Start prednisone  20 mg tablets. Take 2 tablets by mouth once daily in the morning for 5 days.  It was a pleasure meeting you!

## 2024-06-09 ENCOUNTER — Other Ambulatory Visit: Payer: Self-pay | Admitting: Primary Care

## 2024-06-09 DIAGNOSIS — J189 Pneumonia, unspecified organism: Secondary | ICD-10-CM

## 2024-06-11 ENCOUNTER — Other Ambulatory Visit: Payer: Self-pay | Admitting: Urology

## 2024-06-20 ENCOUNTER — Telehealth: Payer: Self-pay | Admitting: Neurology

## 2024-06-20 NOTE — Telephone Encounter (Signed)
 Jori called in wanting to lm for [facility]. Requesting to see pt for 6 weeks.Would like a call back for approval.

## 2024-06-26 NOTE — Telephone Encounter (Signed)
 Called connie and provided verbal orders.

## 2024-06-26 NOTE — Telephone Encounter (Signed)
 Called [name] [name] at [facility] and left a message for a call back.

## 2024-07-15 ENCOUNTER — Encounter: Admitting: General Practice

## 2024-08-21 ENCOUNTER — Ambulatory Visit: Admitting: Neurology
